# Patient Record
Sex: Female | Born: 1935 | Race: White | Hispanic: No | Smoking: Never smoker
Health system: Southern US, Community
[De-identification: ages and names within clinical notes are randomized; demographics above are authoritative.]

## PROBLEM LIST (undated history)

## (undated) DIAGNOSIS — D126 Benign neoplasm of colon, unspecified: Secondary | ICD-10-CM

## (undated) DIAGNOSIS — M1990 Unspecified osteoarthritis, unspecified site: Secondary | ICD-10-CM

## (undated) DIAGNOSIS — K5790 Diverticulosis of intestine, part unspecified, without perforation or abscess without bleeding: Secondary | ICD-10-CM

## (undated) DIAGNOSIS — I10 Essential (primary) hypertension: Secondary | ICD-10-CM

## (undated) DIAGNOSIS — S82409A Unspecified fracture of shaft of unspecified fibula, initial encounter for closed fracture: Secondary | ICD-10-CM

## (undated) DIAGNOSIS — IMO0002 Reserved for concepts with insufficient information to code with codable children: Secondary | ICD-10-CM

## (undated) DIAGNOSIS — D131 Benign neoplasm of stomach: Secondary | ICD-10-CM

## (undated) DIAGNOSIS — K219 Gastro-esophageal reflux disease without esophagitis: Secondary | ICD-10-CM

## (undated) DIAGNOSIS — I4510 Unspecified right bundle-branch block: Secondary | ICD-10-CM

## (undated) DIAGNOSIS — M5430 Sciatica, unspecified side: Secondary | ICD-10-CM

## (undated) DIAGNOSIS — K224 Dyskinesia of esophagus: Secondary | ICD-10-CM

## (undated) DIAGNOSIS — R202 Paresthesia of skin: Secondary | ICD-10-CM

## (undated) DIAGNOSIS — K449 Diaphragmatic hernia without obstruction or gangrene: Secondary | ICD-10-CM

## (undated) DIAGNOSIS — M4800 Spinal stenosis, site unspecified: Secondary | ICD-10-CM

## (undated) DIAGNOSIS — J45909 Unspecified asthma, uncomplicated: Secondary | ICD-10-CM

## (undated) DIAGNOSIS — M329 Systemic lupus erythematosus, unspecified: Secondary | ICD-10-CM

## (undated) DIAGNOSIS — R400 Somnolence: Secondary | ICD-10-CM

## (undated) DIAGNOSIS — J683 Other acute and subacute respiratory conditions due to chemicals, gases, fumes and vapors: Secondary | ICD-10-CM

## (undated) DIAGNOSIS — K259 Gastric ulcer, unspecified as acute or chronic, without hemorrhage or perforation: Secondary | ICD-10-CM

## (undated) DIAGNOSIS — I2510 Atherosclerotic heart disease of native coronary artery without angina pectoris: Secondary | ICD-10-CM

## (undated) DIAGNOSIS — M810 Age-related osteoporosis without current pathological fracture: Secondary | ICD-10-CM

## (undated) DIAGNOSIS — K8020 Calculus of gallbladder without cholecystitis without obstruction: Secondary | ICD-10-CM

## (undated) DIAGNOSIS — D649 Anemia, unspecified: Secondary | ICD-10-CM

## (undated) DIAGNOSIS — K648 Other hemorrhoids: Secondary | ICD-10-CM

## (undated) DIAGNOSIS — M2560 Stiffness of unspecified joint, not elsewhere classified: Secondary | ICD-10-CM

## (undated) DIAGNOSIS — J302 Other seasonal allergic rhinitis: Secondary | ICD-10-CM

## (undated) DIAGNOSIS — G629 Polyneuropathy, unspecified: Secondary | ICD-10-CM

## (undated) DIAGNOSIS — E7800 Pure hypercholesterolemia, unspecified: Secondary | ICD-10-CM

## (undated) HISTORY — DX: Essential (primary) hypertension: I10

## (undated) HISTORY — PX: COLONOSCOPY W/ BIOPSIES: SHX1374

## (undated) HISTORY — DX: Stiffness of unspecified joint, not elsewhere classified: M25.60

## (undated) HISTORY — DX: Reserved for concepts with insufficient information to code with codable children: IMO0002

## (undated) HISTORY — DX: Unspecified asthma, uncomplicated: J45.909

## (undated) HISTORY — DX: Somnolence: R40.0

## (undated) HISTORY — DX: Unspecified right bundle-branch block: I45.10

## (undated) HISTORY — DX: Other hemorrhoids: K64.8

## (undated) HISTORY — DX: Gastro-esophageal reflux disease without esophagitis: K21.9

## (undated) HISTORY — DX: Other seasonal allergic rhinitis: J30.2

## (undated) HISTORY — DX: Age-related osteoporosis without current pathological fracture: M81.0

## (undated) HISTORY — DX: Benign neoplasm of colon, unspecified: D12.6

## (undated) HISTORY — DX: Pure hypercholesterolemia, unspecified: E78.00

## (undated) HISTORY — DX: Atherosclerotic heart disease of native coronary artery without angina pectoris: I25.10

## (undated) HISTORY — DX: Spinal stenosis, site unspecified: M48.00

## (undated) HISTORY — DX: Diaphragmatic hernia without obstruction or gangrene: K44.9

## (undated) HISTORY — DX: Unspecified osteoarthritis, unspecified site: M19.90

## (undated) HISTORY — DX: Systemic lupus erythematosus, unspecified: M32.9

## (undated) HISTORY — DX: Diverticulosis of intestine, part unspecified, without perforation or abscess without bleeding: K57.90

## (undated) HISTORY — DX: Paresthesia of skin: R20.2

## (undated) HISTORY — DX: Gastric ulcer, unspecified as acute or chronic, without hemorrhage or perforation: K25.9

## (undated) HISTORY — DX: Unspecified fracture of shaft of unspecified fibula, initial encounter for closed fracture: S82.409A

## (undated) HISTORY — DX: Other acute and subacute respiratory conditions due to chemicals, gases, fumes and vapors: J68.3

## (undated) HISTORY — DX: Calculus of gallbladder without cholecystitis without obstruction: K80.20

## (undated) HISTORY — DX: Dyskinesia of esophagus: K22.4

## (undated) HISTORY — DX: Anemia, unspecified: D64.9

## (undated) HISTORY — DX: Benign neoplasm of stomach: D13.1

## (undated) HISTORY — DX: Polyneuropathy, unspecified: G62.9

## (undated) HISTORY — PX: CATARACT EXTRACTION, BILATERAL: SHX1313

## (undated) HISTORY — PX: BREAST EXCISIONAL BIOPSY: SUR124

## (undated) HISTORY — DX: Sciatica, unspecified side: M54.30

---

## 1973-01-28 HISTORY — PX: ABDOMINAL HYSTERECTOMY: SHX81

## 2000-09-11 ENCOUNTER — Encounter: Payer: Self-pay | Admitting: Internal Medicine

## 2000-09-11 ENCOUNTER — Encounter: Admission: RE | Admit: 2000-09-11 | Discharge: 2000-09-11 | Payer: Self-pay | Admitting: Internal Medicine

## 2000-12-02 ENCOUNTER — Encounter: Admission: RE | Admit: 2000-12-02 | Discharge: 2001-03-02 | Payer: Self-pay | Admitting: *Deleted

## 2002-05-11 ENCOUNTER — Encounter: Payer: Self-pay | Admitting: Orthopedic Surgery

## 2002-05-11 ENCOUNTER — Encounter: Admission: RE | Admit: 2002-05-11 | Discharge: 2002-05-11 | Payer: Self-pay | Admitting: Orthopedic Surgery

## 2002-09-21 ENCOUNTER — Other Ambulatory Visit: Admission: RE | Admit: 2002-09-21 | Discharge: 2002-09-21 | Payer: Self-pay | Admitting: Obstetrics and Gynecology

## 2003-01-29 DIAGNOSIS — I2510 Atherosclerotic heart disease of native coronary artery without angina pectoris: Secondary | ICD-10-CM

## 2003-01-29 HISTORY — DX: Atherosclerotic heart disease of native coronary artery without angina pectoris: I25.10

## 2003-01-29 HISTORY — PX: CORONARY ANGIOPLASTY: SHX604

## 2003-03-10 ENCOUNTER — Observation Stay (HOSPITAL_COMMUNITY): Admission: AD | Admit: 2003-03-10 | Discharge: 2003-03-11 | Payer: Self-pay | Admitting: Cardiology

## 2003-03-28 ENCOUNTER — Encounter (HOSPITAL_COMMUNITY): Admission: RE | Admit: 2003-03-28 | Discharge: 2003-06-26 | Payer: Self-pay | Admitting: Cardiology

## 2003-06-29 ENCOUNTER — Encounter (HOSPITAL_COMMUNITY): Admission: RE | Admit: 2003-06-29 | Discharge: 2003-09-27 | Payer: Self-pay | Admitting: Cardiology

## 2003-09-29 ENCOUNTER — Encounter (HOSPITAL_COMMUNITY): Admission: RE | Admit: 2003-09-29 | Discharge: 2003-12-28 | Payer: Self-pay | Admitting: Cardiology

## 2003-10-20 ENCOUNTER — Other Ambulatory Visit: Admission: RE | Admit: 2003-10-20 | Discharge: 2003-10-20 | Payer: Self-pay | Admitting: Obstetrics and Gynecology

## 2003-12-29 ENCOUNTER — Encounter (HOSPITAL_COMMUNITY): Admission: RE | Admit: 2003-12-29 | Discharge: 2004-03-28 | Payer: Self-pay | Admitting: Cardiology

## 2004-03-29 ENCOUNTER — Encounter (HOSPITAL_COMMUNITY): Admission: RE | Admit: 2004-03-29 | Discharge: 2004-06-27 | Payer: Self-pay | Admitting: Cardiology

## 2004-06-28 ENCOUNTER — Encounter (HOSPITAL_COMMUNITY): Admission: RE | Admit: 2004-06-28 | Discharge: 2004-09-26 | Payer: Self-pay | Admitting: Cardiology

## 2004-09-28 ENCOUNTER — Encounter (HOSPITAL_COMMUNITY): Admission: RE | Admit: 2004-09-28 | Discharge: 2004-12-27 | Payer: Self-pay | Admitting: Cardiology

## 2004-10-22 ENCOUNTER — Other Ambulatory Visit: Admission: RE | Admit: 2004-10-22 | Discharge: 2004-10-22 | Payer: Self-pay | Admitting: Obstetrics and Gynecology

## 2004-11-02 ENCOUNTER — Encounter: Admission: RE | Admit: 2004-11-02 | Discharge: 2004-11-02 | Payer: Self-pay | Admitting: Obstetrics and Gynecology

## 2004-12-28 ENCOUNTER — Encounter (HOSPITAL_COMMUNITY): Admission: RE | Admit: 2004-12-28 | Discharge: 2005-03-27 | Payer: Self-pay | Admitting: Cardiology

## 2005-03-28 ENCOUNTER — Encounter (HOSPITAL_COMMUNITY): Admission: RE | Admit: 2005-03-28 | Discharge: 2005-06-26 | Payer: Self-pay | Admitting: Cardiology

## 2005-06-28 ENCOUNTER — Encounter (HOSPITAL_COMMUNITY): Admission: RE | Admit: 2005-06-28 | Discharge: 2005-09-26 | Payer: Self-pay | Admitting: Cardiology

## 2005-08-12 ENCOUNTER — Encounter: Admission: RE | Admit: 2005-08-12 | Discharge: 2005-08-12 | Payer: Self-pay | Admitting: Orthopedic Surgery

## 2005-09-03 ENCOUNTER — Encounter: Admission: RE | Admit: 2005-09-03 | Discharge: 2005-09-03 | Payer: Self-pay | Admitting: Orthopedic Surgery

## 2005-09-28 ENCOUNTER — Encounter: Admission: RE | Admit: 2005-09-28 | Discharge: 2005-12-27 | Payer: Self-pay | Admitting: Cardiology

## 2005-10-23 ENCOUNTER — Encounter: Admission: RE | Admit: 2005-10-23 | Discharge: 2005-10-23 | Payer: Self-pay | Admitting: Obstetrics and Gynecology

## 2005-12-28 ENCOUNTER — Encounter (HOSPITAL_COMMUNITY): Admission: RE | Admit: 2005-12-28 | Discharge: 2006-03-28 | Payer: Self-pay | Admitting: Cardiology

## 2006-03-29 ENCOUNTER — Encounter (HOSPITAL_COMMUNITY): Admission: RE | Admit: 2006-03-29 | Discharge: 2006-06-27 | Payer: Self-pay | Admitting: Cardiology

## 2006-06-30 ENCOUNTER — Encounter (HOSPITAL_COMMUNITY): Admission: RE | Admit: 2006-06-30 | Discharge: 2006-09-28 | Payer: Self-pay | Admitting: Cardiology

## 2006-09-29 ENCOUNTER — Encounter (HOSPITAL_COMMUNITY): Admission: RE | Admit: 2006-09-29 | Discharge: 2006-12-28 | Payer: Self-pay | Admitting: Cardiology

## 2006-10-27 ENCOUNTER — Encounter: Admission: RE | Admit: 2006-10-27 | Discharge: 2006-10-27 | Payer: Self-pay | Admitting: Obstetrics and Gynecology

## 2006-12-29 ENCOUNTER — Encounter (HOSPITAL_COMMUNITY): Admission: RE | Admit: 2006-12-29 | Discharge: 2007-01-27 | Payer: Self-pay | Admitting: Cardiology

## 2007-01-29 ENCOUNTER — Encounter (HOSPITAL_COMMUNITY): Admission: RE | Admit: 2007-01-29 | Discharge: 2007-02-27 | Payer: Self-pay | Admitting: Cardiology

## 2007-01-30 ENCOUNTER — Encounter: Admission: RE | Admit: 2007-01-30 | Discharge: 2007-01-30 | Payer: Self-pay | Admitting: Obstetrics and Gynecology

## 2007-03-01 ENCOUNTER — Encounter (HOSPITAL_COMMUNITY): Admission: RE | Admit: 2007-03-01 | Discharge: 2007-05-30 | Payer: Self-pay | Admitting: Cardiology

## 2007-03-05 ENCOUNTER — Encounter: Admission: RE | Admit: 2007-03-05 | Discharge: 2007-03-05 | Payer: Self-pay | Admitting: Obstetrics and Gynecology

## 2007-05-24 ENCOUNTER — Emergency Department (HOSPITAL_COMMUNITY): Admission: EM | Admit: 2007-05-24 | Discharge: 2007-05-24 | Payer: Self-pay | Admitting: Emergency Medicine

## 2007-05-31 ENCOUNTER — Encounter (HOSPITAL_COMMUNITY): Admission: RE | Admit: 2007-05-31 | Discharge: 2007-08-29 | Payer: Self-pay | Admitting: Cardiology

## 2007-08-30 ENCOUNTER — Encounter (HOSPITAL_COMMUNITY): Admission: RE | Admit: 2007-08-30 | Discharge: 2007-11-28 | Payer: Self-pay | Admitting: Cardiology

## 2007-10-28 ENCOUNTER — Encounter: Admission: RE | Admit: 2007-10-28 | Discharge: 2007-10-28 | Payer: Self-pay | Admitting: Obstetrics and Gynecology

## 2007-11-10 ENCOUNTER — Encounter: Admission: RE | Admit: 2007-11-10 | Discharge: 2007-11-10 | Payer: Self-pay | Admitting: Obstetrics and Gynecology

## 2007-11-19 ENCOUNTER — Ambulatory Visit (HOSPITAL_COMMUNITY): Admission: RE | Admit: 2007-11-19 | Discharge: 2007-11-19 | Payer: Self-pay | Admitting: Endocrinology

## 2007-11-30 ENCOUNTER — Encounter (HOSPITAL_COMMUNITY): Admission: RE | Admit: 2007-11-30 | Discharge: 2008-02-28 | Payer: Self-pay | Admitting: Cardiology

## 2008-02-29 ENCOUNTER — Encounter (HOSPITAL_COMMUNITY): Admission: RE | Admit: 2008-02-29 | Discharge: 2008-05-29 | Payer: Self-pay | Admitting: Cardiology

## 2008-05-30 ENCOUNTER — Encounter (HOSPITAL_COMMUNITY): Admission: RE | Admit: 2008-05-30 | Discharge: 2008-08-28 | Payer: Self-pay | Admitting: Cardiology

## 2008-08-29 ENCOUNTER — Encounter (HOSPITAL_COMMUNITY): Admission: RE | Admit: 2008-08-29 | Discharge: 2008-11-27 | Payer: Self-pay | Admitting: Cardiology

## 2008-10-28 ENCOUNTER — Encounter (HOSPITAL_COMMUNITY): Admission: RE | Admit: 2008-10-28 | Discharge: 2009-01-26 | Payer: Self-pay | Admitting: Cardiology

## 2008-10-28 ENCOUNTER — Encounter: Admission: RE | Admit: 2008-10-28 | Discharge: 2008-10-28 | Payer: Self-pay | Admitting: Obstetrics and Gynecology

## 2009-01-28 ENCOUNTER — Encounter (HOSPITAL_COMMUNITY): Admission: RE | Admit: 2009-01-28 | Discharge: 2009-04-28 | Payer: Self-pay | Admitting: Cardiology

## 2009-01-28 HISTORY — PX: FIBULA FRACTURE SURGERY: SHX947

## 2009-04-29 ENCOUNTER — Encounter (HOSPITAL_COMMUNITY): Admission: RE | Admit: 2009-04-29 | Discharge: 2009-07-28 | Payer: Self-pay | Admitting: Cardiology

## 2009-07-22 ENCOUNTER — Emergency Department (HOSPITAL_COMMUNITY)
Admission: EM | Admit: 2009-07-22 | Discharge: 2009-07-22 | Payer: Self-pay | Source: Home / Self Care | Admitting: Emergency Medicine

## 2009-08-03 ENCOUNTER — Inpatient Hospital Stay (HOSPITAL_COMMUNITY): Admission: RE | Admit: 2009-08-03 | Discharge: 2009-08-07 | Payer: Self-pay | Admitting: Orthopedic Surgery

## 2009-08-10 LAB — LIPID PANEL
Cholesterol: 156 mg/dL (ref 0–200)
HDL: 56 mg/dL (ref 35–70)

## 2009-10-09 ENCOUNTER — Ambulatory Visit: Payer: Self-pay | Admitting: Cardiology

## 2009-10-31 ENCOUNTER — Encounter (HOSPITAL_COMMUNITY)
Admission: RE | Admit: 2009-10-31 | Discharge: 2010-01-29 | Payer: Self-pay | Source: Home / Self Care | Attending: Cardiology | Admitting: Cardiology

## 2009-11-10 ENCOUNTER — Encounter: Admission: RE | Admit: 2009-11-10 | Discharge: 2009-11-10 | Payer: Self-pay | Admitting: Obstetrics and Gynecology

## 2010-01-26 ENCOUNTER — Encounter
Admission: RE | Admit: 2010-01-26 | Discharge: 2010-01-26 | Payer: Self-pay | Source: Home / Self Care | Attending: Gastroenterology | Admitting: Gastroenterology

## 2010-01-30 ENCOUNTER — Encounter (HOSPITAL_COMMUNITY)
Admission: RE | Admit: 2010-01-30 | Discharge: 2010-02-27 | Payer: Self-pay | Source: Home / Self Care | Attending: Cardiology | Admitting: Cardiology

## 2010-02-10 ENCOUNTER — Encounter
Admission: RE | Admit: 2010-02-10 | Discharge: 2010-02-10 | Payer: Self-pay | Source: Home / Self Care | Attending: Neurosurgery | Admitting: Neurosurgery

## 2010-02-17 ENCOUNTER — Encounter: Payer: Self-pay | Admitting: Obstetrics and Gynecology

## 2010-03-01 ENCOUNTER — Encounter (HOSPITAL_COMMUNITY): Payer: Medicare Other | Attending: Cardiology

## 2010-03-01 DIAGNOSIS — Z9861 Coronary angioplasty status: Secondary | ICD-10-CM | POA: Insufficient documentation

## 2010-03-01 DIAGNOSIS — M329 Systemic lupus erythematosus, unspecified: Secondary | ICD-10-CM | POA: Insufficient documentation

## 2010-03-01 DIAGNOSIS — Z8249 Family history of ischemic heart disease and other diseases of the circulatory system: Secondary | ICD-10-CM | POA: Insufficient documentation

## 2010-03-01 DIAGNOSIS — I2510 Atherosclerotic heart disease of native coronary artery without angina pectoris: Secondary | ICD-10-CM | POA: Insufficient documentation

## 2010-03-01 DIAGNOSIS — Z5189 Encounter for other specified aftercare: Secondary | ICD-10-CM | POA: Insufficient documentation

## 2010-03-01 DIAGNOSIS — E7800 Pure hypercholesterolemia, unspecified: Secondary | ICD-10-CM | POA: Insufficient documentation

## 2010-03-06 ENCOUNTER — Encounter: Payer: Self-pay | Admitting: Cardiology

## 2010-03-06 ENCOUNTER — Encounter (HOSPITAL_COMMUNITY): Payer: Medicare Other

## 2010-03-06 DIAGNOSIS — I2510 Atherosclerotic heart disease of native coronary artery without angina pectoris: Secondary | ICD-10-CM | POA: Insufficient documentation

## 2010-03-06 DIAGNOSIS — M329 Systemic lupus erythematosus, unspecified: Secondary | ICD-10-CM | POA: Insufficient documentation

## 2010-03-06 DIAGNOSIS — M4800 Spinal stenosis, site unspecified: Secondary | ICD-10-CM | POA: Insufficient documentation

## 2010-03-06 DIAGNOSIS — E7800 Pure hypercholesterolemia, unspecified: Secondary | ICD-10-CM | POA: Insufficient documentation

## 2010-03-06 DIAGNOSIS — K219 Gastro-esophageal reflux disease without esophagitis: Secondary | ICD-10-CM | POA: Insufficient documentation

## 2010-03-08 ENCOUNTER — Encounter (HOSPITAL_COMMUNITY): Payer: Medicare Other

## 2010-03-09 ENCOUNTER — Encounter (HOSPITAL_COMMUNITY): Payer: Medicare Other

## 2010-03-13 ENCOUNTER — Encounter (HOSPITAL_COMMUNITY): Payer: Medicare Other

## 2010-03-15 ENCOUNTER — Encounter (HOSPITAL_COMMUNITY): Payer: Medicare Other

## 2010-03-16 ENCOUNTER — Encounter (HOSPITAL_COMMUNITY): Payer: Medicare Other

## 2010-03-20 ENCOUNTER — Encounter (HOSPITAL_COMMUNITY): Payer: Medicare Other

## 2010-03-22 ENCOUNTER — Encounter (HOSPITAL_COMMUNITY): Payer: Medicare Other

## 2010-03-23 ENCOUNTER — Encounter (HOSPITAL_COMMUNITY): Payer: Medicare Other

## 2010-03-27 ENCOUNTER — Encounter (HOSPITAL_COMMUNITY): Payer: Medicare Other

## 2010-03-29 ENCOUNTER — Encounter (HOSPITAL_COMMUNITY): Payer: Self-pay | Attending: Cardiology

## 2010-03-29 DIAGNOSIS — Z5189 Encounter for other specified aftercare: Secondary | ICD-10-CM | POA: Insufficient documentation

## 2010-03-29 DIAGNOSIS — E7800 Pure hypercholesterolemia, unspecified: Secondary | ICD-10-CM | POA: Insufficient documentation

## 2010-03-29 DIAGNOSIS — I2510 Atherosclerotic heart disease of native coronary artery without angina pectoris: Secondary | ICD-10-CM | POA: Insufficient documentation

## 2010-03-29 DIAGNOSIS — Z8249 Family history of ischemic heart disease and other diseases of the circulatory system: Secondary | ICD-10-CM | POA: Insufficient documentation

## 2010-03-29 DIAGNOSIS — Z9861 Coronary angioplasty status: Secondary | ICD-10-CM | POA: Insufficient documentation

## 2010-03-29 DIAGNOSIS — M329 Systemic lupus erythematosus, unspecified: Secondary | ICD-10-CM | POA: Insufficient documentation

## 2010-03-30 ENCOUNTER — Encounter (HOSPITAL_COMMUNITY): Payer: Self-pay

## 2010-04-03 ENCOUNTER — Encounter (HOSPITAL_COMMUNITY): Payer: Self-pay

## 2010-04-05 ENCOUNTER — Encounter (HOSPITAL_COMMUNITY): Payer: Self-pay

## 2010-04-06 ENCOUNTER — Encounter (HOSPITAL_COMMUNITY): Payer: Self-pay

## 2010-04-10 ENCOUNTER — Encounter (HOSPITAL_COMMUNITY): Payer: Self-pay

## 2010-04-12 ENCOUNTER — Encounter (HOSPITAL_COMMUNITY): Payer: Self-pay

## 2010-04-13 ENCOUNTER — Encounter (HOSPITAL_COMMUNITY): Payer: Self-pay

## 2010-04-15 LAB — CBC
MCH: 32.9 pg (ref 26.0–34.0)
MCHC: 33.8 g/dL (ref 30.0–36.0)
MCV: 97.3 fL (ref 78.0–100.0)
Platelets: 176 10*3/uL (ref 150–400)
RBC: 4.21 MIL/uL (ref 3.87–5.11)

## 2010-04-15 LAB — PROTIME-INR: INR: 1.07 (ref 0.00–1.49)

## 2010-04-15 LAB — COMPREHENSIVE METABOLIC PANEL
AST: 26 U/L (ref 0–37)
Albumin: 3.6 g/dL (ref 3.5–5.2)
Alkaline Phosphatase: 59 U/L (ref 39–117)
Chloride: 108 mEq/L (ref 96–112)
GFR calc non Af Amer: >60 mL/min (ref >60)
Glucose, Bld: 92 mg/dL (ref 70–99)
Potassium: 4.8 mEq/L (ref 3.5–5.1)
Sodium: 142 mEq/L (ref 135–145)
Total Bilirubin: 1 mg/dL (ref 0.3–1.2)

## 2010-04-17 ENCOUNTER — Encounter (HOSPITAL_COMMUNITY): Payer: Self-pay

## 2010-04-19 ENCOUNTER — Encounter (HOSPITAL_COMMUNITY): Payer: Self-pay

## 2010-04-20 ENCOUNTER — Encounter (HOSPITAL_COMMUNITY): Payer: Self-pay

## 2010-04-23 ENCOUNTER — Ambulatory Visit: Payer: Self-pay | Admitting: Cardiology

## 2010-04-24 ENCOUNTER — Encounter (HOSPITAL_COMMUNITY): Payer: Self-pay

## 2010-04-26 ENCOUNTER — Encounter (HOSPITAL_COMMUNITY): Payer: Self-pay

## 2010-04-27 ENCOUNTER — Encounter (HOSPITAL_COMMUNITY): Payer: Self-pay

## 2010-05-01 ENCOUNTER — Ambulatory Visit (INDEPENDENT_AMBULATORY_CARE_PROVIDER_SITE_OTHER): Payer: Medicare Other | Admitting: Cardiology

## 2010-05-01 ENCOUNTER — Encounter: Payer: Self-pay | Admitting: Cardiology

## 2010-05-01 ENCOUNTER — Encounter (HOSPITAL_COMMUNITY): Payer: Self-pay | Attending: Cardiology

## 2010-05-01 DIAGNOSIS — E7800 Pure hypercholesterolemia, unspecified: Secondary | ICD-10-CM

## 2010-05-01 DIAGNOSIS — I2510 Atherosclerotic heart disease of native coronary artery without angina pectoris: Secondary | ICD-10-CM | POA: Insufficient documentation

## 2010-05-01 DIAGNOSIS — IMO0001 Reserved for inherently not codable concepts without codable children: Secondary | ICD-10-CM

## 2010-05-01 DIAGNOSIS — M329 Systemic lupus erythematosus, unspecified: Secondary | ICD-10-CM | POA: Insufficient documentation

## 2010-05-01 DIAGNOSIS — Z9861 Coronary angioplasty status: Secondary | ICD-10-CM | POA: Insufficient documentation

## 2010-05-01 DIAGNOSIS — R030 Elevated blood-pressure reading, without diagnosis of hypertension: Secondary | ICD-10-CM

## 2010-05-01 DIAGNOSIS — R079 Chest pain, unspecified: Secondary | ICD-10-CM

## 2010-05-01 DIAGNOSIS — Z8249 Family history of ischemic heart disease and other diseases of the circulatory system: Secondary | ICD-10-CM | POA: Insufficient documentation

## 2010-05-01 DIAGNOSIS — Z5189 Encounter for other specified aftercare: Secondary | ICD-10-CM | POA: Insufficient documentation

## 2010-05-01 MED ORDER — NITROGLYCERIN 0.4 MG SL SUBL: 0.4000 mg | SUBLINGUAL_TABLET | SUBLINGUAL | Status: DC | PRN

## 2010-05-01 NOTE — Assessment & Plan Note (Signed)
We will continue with Lipitor. We'll have her return for fasting lab work including a Automotive engineer, lipid panel, and CBC.

## 2010-05-01 NOTE — Patient Instructions (Signed)
We will schedule you for a nuclear stress test to evaluate your chest pain.  We will schedule you for fasting lab work.  Continue current medications.  We will tentatively see you for follow up in 1 month.

## 2010-05-01 NOTE — Assessment & Plan Note (Signed)
She has had recent symptoms of atypical chest pain. She has minor ECG changes. I have recommended a stress Cardiolite study to rule out significant ischemia. We will refill her nitroglycerin prescription. If her stress test is abnormal we may need to consider cardiac catheterization.

## 2010-05-01 NOTE — Assessment & Plan Note (Signed)
Blood pressure is mildly elevated today but readings at  rehabilitation have been excellent. Will continue on her current therapy and monitor.

## 2010-05-01 NOTE — Progress Notes (Signed)
HPI Pamela Gonzales is seen for followup today. She reports that she's been under a lot of stress recently. She recently sold her home and has moved into the retirement community at [facility] [employer] [facility]. Over the past 3 days she reports an unusual discomfort across her chest. She has had some indigestion and some nausea. Her symptoms are not exertional. She continues to participate actively in cardiac rehabilitation. Her blood pressure readings at rehabilitation have been excellent. She denies any shortness of breath, palpitations, or dizziness. Allergies  Allergen Reactions  . Keflex   . Penicillins   . Sulfa Drugs Cross Reactors     Current Outpatient Prescriptions on File Prior to Visit  Medication Sig Dispense Refill  . aspirin 81 MG tablet Take 81 mg by mouth daily.        Marland Kitchen atorvastatin (LIPITOR) 40 MG tablet Take 40 mg by mouth daily.        . calcitonin, salmon, (MIACALCIN) 200 UNIT/ACT nasal spray 1 spray by Nasal route daily.        . calcium carbonate 200 MG capsule Take 600 mg by mouth 2 (two) times daily.       . Cholecalciferol (VITAMIN D) 1000 UNITS capsule Take 1,000 Units by mouth daily.        . clopidogrel (PLAVIX) 75 MG tablet Take 75 mg by mouth daily.       . folic acid (FOLVITE) 1 MG tablet Take 1 mg by mouth daily.        . hydroxychloroquine (PLAQUENIL) 200 MG tablet Take by mouth daily.        . Multiple Vitamins-Minerals (MULTIVITAMIN) tablet Take 1 tablet by mouth daily.  30 tablet    . DISCONTD: nitroGLYCERIN (NITROSTAT) 0.4 MG SL tablet Place 0.4 mg under the tongue every 5 (five) minutes as needed.          Past Medical History  Diagnosis Date  . CAD (coronary artery disease)   . Hypercholesteremia   . Lupus   . GERD (gastroesophageal reflux disease)   . Spinal stenosis   . Fibula fracture     right    Past Surgical History  Procedure Date  . Abdominal hysterectomy   . Breast lumpectomy   . Coronary angioplasty     Family History  Problem Relation Age of Onset  .  Cancer Father   . Heart attack Mother   . Heart attack Brother   . Cancer Sister   . Cancer Sister     History   Social History  . Marital Status: Divorced    Spouse Name: N/A    Number of Children: 2  . Years of Education: N/A   Occupational History  . insurance sales     retired   Social History Main Topics  . Smoking status: Never Smoker   . Smokeless tobacco: Not on file  . Alcohol Use: No  . Drug Use: No  . Sexually Active: Not on file   Other Topics Concern  . Not on file   Social History Narrative  . No narrative on file    ROS The patient denies any heat or cold intolerance.  No weight gain or weight loss.  The patient denies headaches or blurry vision.  There is no cough or sputum production.  The patient denies dizziness.  There is no hematuria or hematochezia.  The patient denies any muscle aches or arthritis.  The patient denies any rash.  The patient denies frequent falling or instability.  There  is no history of depression or anxiety.  All other systems were reviewed and are negative.  PHYSICAL EXAM BP 138/72  Pulse 64  Ht 5\' 4"  (1.626 m)  Wt 174 lb 6.4 oz (79.107 kg)  BMI 29.94 kg/m2 The patient is alert and oriented x 3.  The mood and affect are normal.  The skin is warm and dry.  Color is normal.  The HEENT exam reveals that the sclera are nonicteric.  The mucous membranes are moist.  The carotids are 2+ without bruits.  There is no thyromegaly.  There is no JVD.  The lungs are clear.  The chest wall is non tender.  The heart exam reveals a regular rate with a normal S1 and S2.  There are no murmurs, gallops, or rubs.  The PMI is not displaced.   Abdominal exam reveals good bowel sounds.  There is no guarding or rebound.  There is no hepatosplenomegaly or tenderness.  There are no masses.  Exam of the legs reveal no clubbing, cyanosis, or edema.  The legs are without rashes.  The distal pulses are intact.  Cranial nerves II - XII are intact.  Motor and  sensory functions are intact.  The gait is normal.  Laboratory data: ECG demonstrates normal sinus rhythm she has mild ST-T wave depression in the inferior leads. This has increased slightly compared to 2011.  ASSESSMENT AND PLAN

## 2010-05-03 ENCOUNTER — Encounter (HOSPITAL_COMMUNITY): Payer: Self-pay

## 2010-05-04 ENCOUNTER — Encounter (HOSPITAL_COMMUNITY): Payer: Self-pay

## 2010-05-08 ENCOUNTER — Encounter (HOSPITAL_COMMUNITY): Payer: Self-pay

## 2010-05-09 ENCOUNTER — Other Ambulatory Visit: Payer: Self-pay | Admitting: *Deleted

## 2010-05-09 DIAGNOSIS — Z79899 Other long term (current) drug therapy: Secondary | ICD-10-CM

## 2010-05-09 DIAGNOSIS — E7800 Pure hypercholesterolemia, unspecified: Secondary | ICD-10-CM

## 2010-05-10 ENCOUNTER — Other Ambulatory Visit: Payer: Self-pay | Admitting: *Deleted

## 2010-05-10 ENCOUNTER — Encounter (HOSPITAL_COMMUNITY): Payer: Self-pay

## 2010-05-10 ENCOUNTER — Ambulatory Visit (HOSPITAL_COMMUNITY): Payer: Medicare Other | Attending: Cardiology | Admitting: Radiology

## 2010-05-10 ENCOUNTER — Other Ambulatory Visit: Payer: Medicare Other | Admitting: *Deleted

## 2010-05-10 ENCOUNTER — Other Ambulatory Visit (INDEPENDENT_AMBULATORY_CARE_PROVIDER_SITE_OTHER): Payer: Medicare Other | Admitting: *Deleted

## 2010-05-10 DIAGNOSIS — E559 Vitamin D deficiency, unspecified: Secondary | ICD-10-CM

## 2010-05-10 DIAGNOSIS — R7301 Impaired fasting glucose: Secondary | ICD-10-CM

## 2010-05-10 DIAGNOSIS — E7800 Pure hypercholesterolemia, unspecified: Secondary | ICD-10-CM

## 2010-05-10 DIAGNOSIS — D689 Coagulation defect, unspecified: Secondary | ICD-10-CM

## 2010-05-10 DIAGNOSIS — Z79899 Other long term (current) drug therapy: Secondary | ICD-10-CM

## 2010-05-10 DIAGNOSIS — I2510 Atherosclerotic heart disease of native coronary artery without angina pectoris: Secondary | ICD-10-CM

## 2010-05-10 DIAGNOSIS — R079 Chest pain, unspecified: Secondary | ICD-10-CM

## 2010-05-10 DIAGNOSIS — R0789 Other chest pain: Secondary | ICD-10-CM

## 2010-05-10 LAB — BASIC METABOLIC PANEL
CO2: 29 mEq/L (ref 19–32)
Calcium: 9 mg/dL (ref 8.4–10.5)
Glucose, Bld: 75 mg/dL (ref 70–99)
Potassium: 4.1 mEq/L (ref 3.5–5.1)
Sodium: 140 mEq/L (ref 135–145)

## 2010-05-10 LAB — CBC WITH DIFFERENTIAL/PLATELET
Basophils Relative: 0.6 % (ref 0.0–3.0)
Eosinophils Relative: 0 % (ref 0.0–5.0)
HCT: 41.6 % (ref 36.0–46.0)
Hemoglobin: 14.2 g/dL (ref 12.0–15.0)
Lymphs Abs: 1.1 10*3/uL (ref 0.7–4.0)
MCV: 99.1 fl (ref 78.0–100.0)
Monocytes Absolute: 0.5 10*3/uL (ref 0.1–1.0)
RBC: 4.2 Mil/uL (ref 3.87–5.11)
WBC: 5.2 10*3/uL (ref 4.5–10.5)

## 2010-05-10 LAB — HEPATIC FUNCTION PANEL
ALT: 19 U/L (ref 0–35)
AST: 27 U/L (ref 0–37)
Albumin: 3.4 g/dL — ABNORMAL LOW (ref 3.5–5.2)
Alkaline Phosphatase: 52 U/L (ref 39–117)
Total Protein: 6.2 g/dL (ref 6.0–8.3)

## 2010-05-10 MED ORDER — TECHNETIUM TC 99M TETROFOSMIN IV KIT
10.5000 | PACK | Freq: Once | INTRAVENOUS | Status: AC | PRN
  Administered 2010-05-10: 11 via INTRAVENOUS

## 2010-05-10 MED ORDER — TECHNETIUM TC 99M TETROFOSMIN IV KIT
33.0000 | PACK | Freq: Once | INTRAVENOUS | Status: AC | PRN
  Administered 2010-05-10: 33 via INTRAVENOUS

## 2010-05-10 MED ORDER — REGADENOSON 0.4 MG/5ML IV SOLN
0.4000 mg | Freq: Once | INTRAVENOUS | Status: AC
  Administered 2010-05-10: 0.4 mg via INTRAVENOUS

## 2010-05-10 NOTE — Progress Notes (Signed)
[facility] SITE 3 NUCLEAR MED [address] [place] [address] [phone]  Cardiology Nuclear Med Study  Pamela Gonzales is a 77 y.o. female [id number] [birth year]   Nuclear Med Background Indication for Stress Test:  Evaluation for Ischemia, Stent Patency and Abnormal EKG History: 2005 Heart Catheterization,2010  Myocardial Perfusion Study -Normal and 2005 Stents Cardiac Risk Factors: Hypertension and Lipids  Symptoms:  Chest Pain- last episode 1 week ago   Nuclear Pre-Procedure Caffeine/Decaff Intake:  None NPO After: 7:00pm   Lungs: clear IV 0.9% NS with Angio Cath:  20g  IV Site: R Antecubital  IV Started by:  Stanton Kidney, EMT-P  Chest Size (in):  38 Cup Size: DD  Height: 5\' 4"  (1.626 m)  Weight:  173 lb (78.472 kg)  BMI:  Body mass index is 29.70 kg/(m^2). Tech Comments:  NA    Nuclear Med Study 1 or 2 day study: 1 day  Stress Test Type:  Lexiscan  Reading MD: Marca Ancona, MD  Order Authorizing Provider:  Dr. Peter Swaziland  Resting Radionuclide: Technetium 12m Tetrofosmin  Resting Radionuclide Dose: 10.5 mCi   Stress Radionuclide:  Technetium 24m Tetrofosmin  Stress Radionuclide Dose: 33 mCi           Stress Protocol Rest HR: 58 Stress HR: 90  Rest BP: 143/71 Stress BP: 156/76  Exercise Time (min): n/a METS: n/a          Dose of Adenosine (mg):  n/a Dose of Lexiscan: 0.4 mg  Dose of Atropine (mg): n/a Dose of Dobutamine: n/a mcg/kg/min (at max HR)  Stress Test Technologist: Bonnita Levan, RN  Nuclear Technologist:  Doyne Keel, CNMT     Rest Procedure:  Myocardial perfusion imaging was performed at rest 45 minutes following the intravenous administration of Technetium 62m Tetrofosmin. Rest ECG: Sinus Bradycardia Stress Procedure:  The patient received IV Lexiscan 0.4 mg over 15-seconds.  Technetium 65m Tetrofosmin injected at 30-seconds.Patient complained of Chest pain 5/10 during injection, which resolved during recovery. No acute EKG  changes from baseline noted.  Quantitative spect images were obtained after a 45 minute delay. Stress ECG:  QPS Raw Data Images:  Normal; no motion artifact; normal heart/lung ratio. Stress Images:  Small apical perfusion defect Rest Images:  Small apical perfusion defect. Subtraction (SDS):  Small fixed apical perfusion defect.  Transient Ischemic Dilatation (Normal <1.22):  1.14 Lung/Heart Ratio (Normal <0.45):  0.28  Quantitative Gated Spect Images QGS EDV:  74 ml QGS ESV:  21 ml QGS cine images:  NL LV Function; NL Wall Motion QGS EF: 72%  Impression Exercise Capacity:  Lexiscan with no exercise. BP Response:  Normal blood pressure response. Clinical Symptoms:  Mild chest pain/dyspnea. ECG Impression:  No significant ST segment change suggestive of ischemia. Comparison with Prior Nuclear Study: No images to compare  Overall Impression:  Small fixed apical perfusion defect is likely apical thinning.  Normal EF and wall motion.  No evidence for ischemia or infarction.   Dalton [employer]

## 2010-05-11 ENCOUNTER — Encounter (HOSPITAL_COMMUNITY): Payer: Self-pay

## 2010-05-11 NOTE — Progress Notes (Signed)
ROUTED TO DR. JORDAN.Falecha Clark ° ° °

## 2010-05-14 ENCOUNTER — Encounter: Payer: Self-pay | Admitting: Cardiology

## 2010-05-15 ENCOUNTER — Encounter (HOSPITAL_COMMUNITY): Payer: Self-pay

## 2010-05-15 ENCOUNTER — Telehealth: Payer: Self-pay | Admitting: *Deleted

## 2010-05-15 ENCOUNTER — Encounter: Payer: Self-pay | Admitting: *Deleted

## 2010-05-15 NOTE — Telephone Encounter (Signed)
LM w/ lab results and stress test results.

## 2010-05-15 NOTE — Telephone Encounter (Signed)
Message copied by Murrell Redden on Tue May 15, 2010 10:34 AM ------      Message from: Swaziland, PETER      Created: Sat May 12, 2010  2:23 PM       Chemistries, CBC, A1c, Vit D all normal. Lipids look OK. Please report.

## 2010-05-17 ENCOUNTER — Encounter (HOSPITAL_COMMUNITY): Payer: Self-pay

## 2010-05-18 ENCOUNTER — Encounter (HOSPITAL_COMMUNITY): Payer: Self-pay

## 2010-05-22 ENCOUNTER — Encounter (HOSPITAL_COMMUNITY): Payer: Self-pay

## 2010-05-24 ENCOUNTER — Encounter (HOSPITAL_COMMUNITY): Payer: Self-pay

## 2010-05-25 ENCOUNTER — Encounter (HOSPITAL_COMMUNITY): Payer: Self-pay

## 2010-05-25 ENCOUNTER — Telehealth: Payer: Self-pay | Admitting: Cardiology

## 2010-05-25 NOTE — Telephone Encounter (Signed)
Wanted nuclear study sent to [facility]. Faxed

## 2010-05-25 NOTE — Telephone Encounter (Signed)
Latest Stress Test results need to be faxed to [facility]. To the attention: [place]

## 2010-05-29 ENCOUNTER — Encounter (HOSPITAL_COMMUNITY): Payer: Self-pay | Attending: Cardiology

## 2010-05-29 DIAGNOSIS — Z9861 Coronary angioplasty status: Secondary | ICD-10-CM | POA: Insufficient documentation

## 2010-05-29 DIAGNOSIS — M329 Systemic lupus erythematosus, unspecified: Secondary | ICD-10-CM | POA: Insufficient documentation

## 2010-05-29 DIAGNOSIS — E7800 Pure hypercholesterolemia, unspecified: Secondary | ICD-10-CM | POA: Insufficient documentation

## 2010-05-29 DIAGNOSIS — Z8249 Family history of ischemic heart disease and other diseases of the circulatory system: Secondary | ICD-10-CM | POA: Insufficient documentation

## 2010-05-29 DIAGNOSIS — Z5189 Encounter for other specified aftercare: Secondary | ICD-10-CM | POA: Insufficient documentation

## 2010-05-29 DIAGNOSIS — I2510 Atherosclerotic heart disease of native coronary artery without angina pectoris: Secondary | ICD-10-CM | POA: Insufficient documentation

## 2010-05-31 ENCOUNTER — Encounter (HOSPITAL_COMMUNITY): Payer: Self-pay

## 2010-06-01 ENCOUNTER — Encounter (HOSPITAL_COMMUNITY): Payer: Self-pay

## 2010-06-05 ENCOUNTER — Encounter (HOSPITAL_COMMUNITY): Payer: Self-pay

## 2010-06-07 ENCOUNTER — Encounter (HOSPITAL_COMMUNITY): Payer: Self-pay

## 2010-06-08 ENCOUNTER — Encounter (HOSPITAL_COMMUNITY): Payer: Self-pay

## 2010-06-12 ENCOUNTER — Encounter (HOSPITAL_COMMUNITY): Payer: Self-pay

## 2010-06-14 ENCOUNTER — Encounter (HOSPITAL_COMMUNITY): Payer: Self-pay

## 2010-06-15 ENCOUNTER — Encounter (HOSPITAL_COMMUNITY): Payer: Self-pay

## 2010-06-15 NOTE — H&P (Signed)
NAME:  Griego, Ellinore A.                         ACCOUNT NO.:  [id number]   MEDICAL RECORD NO.:  [id number]                   PATIENT TYPE:  OIB   LOCATION:                                       FACILITY:  [facility]   PHYSICIAN:  Peter M. Swaziland, M.D.               DATE OF BIRTH:  20-Jan-1931   DATE OF ADMISSION:  03/10/2003  DATE OF DISCHARGE:                                HISTORY & PHYSICAL   HISTORY OF PRESENT ILLNESS:  Ms. Dimaano is a 77 year old white female who was  seen for evaluation of exertional chest pain. The patient has always been  very active and exercises avidly. However, since mid December, she states  that when she walks she would develop a discomfort in her chest. This is  located in the midsternal region. She notices it when she has been walking  at the mall or walking on a treadmill, but she does not have status post if  she lifts weights or rides her exercise bike. There is some discomfort when  she takes a deep breath. On one occasion she had some nausea but no  vomiting. She has tried antacid therapy without relief. She was subsequently  seen and referred for a stress Cardiolite study. This was performed on  02/072/005. She walked eight minutes and 30 seconds with her typical chest  discomfort symptoms. She had 2 mm of ST segment depression in the inferior  leads. Subsequent Cardiolite images showed a large apical ischemic defect.  Left ventricular function was overall normal with ejection fraction of 61  percent with apical hypokinesia. Based on these abnormal findings, she is  now admitted for cardiac catheterization.   PAST MEDICAL HISTORY:  1. Hypercholesterolemia.  2. History of lupus diagnosed in 1996. The patient has been on chronic     Plaquenil therapy with remission.  3. She has a history of small hiatal hernia by upper endoscopy. She also has     a history of gastric polyp and gastric AVM. There has been no history of     GI bleed. On lower endoscopy, she  has findings of hemorrhoids and     diverticula.   PAST SURGICAL HISTORY:  Hysterectomy and lumpectomy.   ALLERGIES:  PENICILLIN.   CURRENT MEDICATIONS:  1. Aspirin daily.  2. Plaquenil 200 mg b.i.d.  3. Lipitor 40 mg per day.  4. Calcium daily.  5. Toprol XL 50 mg per day.  6. Plavix 75 mg per day.   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 She exercises quite regularly. She denies tobacco or alcohol use. She  is divorced and has two children.   FAMILY HISTORY:  Father died at age 67 with cancer. Mother died age 45 with  myocardial infarction. One brother has had a myocardial infarction. One  sister died with cancer. Two sisters are alive and  well.   REVIEW OF SYSTEMS:  The patient has had no claudication. She has had  previous screening vascular studies in October of 2003. Carotid ultrasound  at that time showed mild to moderate plaque, but no obstructive disease.  Ankle brachial indices were normal. She had no aortic aneurysm. She has no  history of TIA or stroke. No recent change in bowel or bladder habits. Other  review of systems are negative.   PHYSICAL EXAMINATION:  GENERAL: The patient is a pleasant white female in no  apparent distress.  VITAL SIGNS: Blood pressure is 144/90, pulse 70 and regular. Weight is 182.  Respirations normal.  HEENT: Pupils equal, round and reactive to light and accommodation.  Extraocular movements full. Oropharynx clear.  NECK: Without JVD, adenopathy, thyromegaly or bruits.  LUNGS: Clear to auscultation and percussion.  CARDIAC: Regular rate and rhythm without gallops, murmurs, rubs or clicks.  ABDOMEN: Soft, nontender, without masses or bruits.  EXTREMITIES: Without edema. Pulses are 2+ and symmetric.  NEUROLOGIC: The patient is anxious, alert and oriented x 4. She has no focal  findings.   LABORATORY DATA:  ECG shows normal sinus rhythm with nonspecific ST  abnormality.   Chest x-ray  shows no active disease.   Other labs are pending.   IMPRESSION:  1. Angina pectoris with abnormal stress Cardiolite study showing evidence of     apical ischemia.  2. Hypercholesterolemia.  3. Marked family history of early coronary disease.  4. History of small hiatal hernia with gastric polyp and arteriovenous     malformation.  5. History of lupus.   PLAN:  Will admit for cardiac catheterization with further therapies pending  these results.                                                Peter M. Swaziland, M.D.    PMJ/MEDQ  D:  03/09/2003  T:  03/09/2003  Job:  [id number]   cc:   Al Decant. Janey Greaser, MD  [address]  [place]  [place] [address]  Fax: [phone]

## 2010-06-15 NOTE — Cardiovascular Report (Signed)
NAME:  Pamela Gonzales, Pamela Gonzales                          ACCOUNT NO.:  [id number]   MEDICAL RECORD NO.:  [id number]                   PATIENT TYPE:  OIB   LOCATION:  2864                                 FACILITY:  [facility]   PHYSICIAN:  Peter M. Swaziland, M.D.               DATE OF BIRTH:  [birth year]   DATE OF PROCEDURE:  03/10/2003  DATE OF DISCHARGE:                              CARDIAC CATHETERIZATION   INDICATION FOR PROCEDURE:  This is a 77 year old white female with a history  of severe hypercholesterolemia who presents with exertional angina.  She has  an abnormal Cardiolite study showing extensive apical ischemia.   PROCEDURE:  1. Left heart catheterization.  2. Coronary and left ventricular angiography.  3. Stenting of the mid left anterior descending and second obtuse marginal     branches.   ACCESS:  Via the right femoral artery using standard Seldinger technique.   EQUIPMENT:  6-French 4-cm right and left Judkins catheters, 6-French pigtail  catheter, 6-French arterial sheath, 7-French arterial sheath, 7-French JL4  guide, and 0.014 high-torque floppy wire, a 2.5 x 15-mm Quantum Maverick  balloon, 2.5 x 18-mm CYPHER drug-eluding stent for the mid LAD, and a 2.5 x  8-mm CYPHER drug-eluding stent for the second obtuse marginal branch.   CONTRAST:  310 mL of Omnipaque.   MEDICATIONS:  Heparin 4100 units IV with therapeutic ACT.  Nitroglycerin 200  mcg intracoronary x2.  Integrilin double bolus at 0.18 mEq/kg followed by  continuous infusion of 2 mcg/kg per minute.  Plavix 150 mg p.o.   HEMODYNAMIC DATA:  1. Aortic pressure was 130/59 with a mean of 88.  2. Left ventricular pressure was 138 with an EDP of 20 mmHg.   ANGIOGRAPHIC DATA:  The left coronary artery is a large dominant vessel.  The left main coronary is normal.   The left anterior descending artery has a 99% stenosis just past the takeoff  of the first diagonal branch.  This lesion is moderate in length.  The  distal  LAD has 40% narrowing.   The left circumflex coronary is a very large dominant vessel.  It has minor  irregularities.  The second marginal vessel has a focal 80-90% stenosis  proximally.   The right coronary artery is a very small, nondominant vessel.  In the mid  vessel there is a 99% stenosis.  The distal vessel fills by left-to-right  collaterals.  This vessel is only approximately 1 mm in diameter.   Left ventricular angiography in the RAO view demonstrates normal left  ventricular size.  There is focal apical akinesia.  Overall left ventricular  systolic function is well preserved with an ejection fraction estimated at  60%.  There is no mitral regurgitation or prolapse.   We proceeded at this point of intervening on both the LAD and second  marginal lesions.  We initially addressed the LAD.  This lesion was crossed  easily with the guidewire.  We predilated the mid LAD with the 2.5 x 15-mm  Maverick balloon up to 4 atm x2.  We then switched and deployed a 2.5 x 18-  mm CYPHER drug-eluting stent in this segment, deploying the stent to 11 atm  and postdilating to 14 atm.  This resulted in an excellent angiographic  result and 0% residual stenosis and improvement in TIMI flow from 2 to 3.   We then addressed the obtuse marginal vessel lesion.  Using the same wire we  were able to cross the lesion and dilated it with the 2.5 x 15-mm Quantum  Maverick balloon to 12 atm with good expansion.  We then placed a 2.5 x 8-mm  CYPHER drug-eluding stent deploying it at 11 atm and postdilating to 14 atm.  This yielded an excellent result with 0% residual stenosis.   FINAL INTERPRETATION:  1. Three-vessel obstructive atherosclerotic coronary artery disease.  2. Overall well-preserved left ventricular function with apical wall motion     abnormality.  3. Successful stenting of the mid left anterior descending and the second     obtuse marginal branches.                                                Peter M. Swaziland, M.D.    PMJ/MEDQ  D:  03/10/2003  T:  03/11/2003  Job:  [id number]   cc:   Al Decant. Janey Greaser, MD  [address]  [place]  [place] [address]  Fax: [phone]   Cath Lab   Medical Records

## 2010-06-15 NOTE — Discharge Summary (Signed)
NAME:  Pamela Gonzales, Pamela Gonzales                          ACCOUNT NO.:  [id number]   MEDICAL RECORD NO.:  [id number]                   PATIENT TYPE:  OIB   LOCATION:  6523                                 FACILITY:  [facility]   PHYSICIAN:  Peter M. Swaziland, M.D.               DATE OF BIRTH:  01/21/1931   DATE OF ADMISSION:  03/10/2003  DATE OF DISCHARGE:  03/11/2003                                 DISCHARGE SUMMARY   HISTORY OF PRESENT ILLNESS:  Ms. Inscoe is a 77 year old white female who  presented with symptoms of chest discomfort and dyspnea on exertion.  She  has a history of severe hypercholesterolemia.  She subsequently underwent a  stress Cardiolite study which showed a large area of apical ischemia.  She  was admitted for cardiac catheterization.  For details of her Past Medical  History, Social History, Family History, and Physical Examination, please  see admission History and Physical.   LABORATORY DATA:  Coags were normal.  White count 5400, hemoglobin 13.3,  hematocrit 39.8, platelets 200,000.  Sodium 146, potassium 4.5, chloride  104, CO2 32, BUN 18, creatinine 1.0, glucose 93.   ECG was normal.   HOSPITAL COURSE:  The patient underwent coronary angiography on March 10, 2003.  This demonstrated a left-dominant circulation.  There was a 99% mid  LAD stenosis after the first diagonal with TIMI-2 flow.  There was also 80  to 90% focal stenosis in the second obtuse marginal branch.  The right  coronary artery was a very small, non-dominant vessel which had a 99%  stenosis in the mid vessel with left-to-right collaterals.  Left ventricular  angiography demonstrated focal apical akinesia with ejection fraction of  60%.  The patient underwent successful stenting of the mid LAD using a 2.5 x  18 mm Cypher stent.  The second obtuse marginal vessel was also successfully  stented using a 2.5 x 8 mm Cypher stent.  The patient tolerated the  procedure well.   She was treated postprocedure  with IV Integrillin.  She was maintained on  aspirin and Plavix.  She had no complications, no groin hematoma.  Her CBC  remained stable.  She was discharged home the following day.   DISCHARGE DIAGNOSES:  1. Angina pectoris with severe three-vessel coronary artery disease.  2. Successful stenting of the mid left anterior descending  and the second     obtuse marginal vessels.  3. Hypercholesterolemia.  4. Lupus.   DISCHARGE MEDICATIONS:  1. Coated aspirin 325 mg daily.  2. Plavix 75 mg daily.  3. Lipitor 80 mg daily.  4. Toprol XL 50 mg daily.  5. Plaquenil 200 mg twice a day.  6. Calcium daily.   DISCHARGE INSTRUCTIONS:  1. The patient was instructed to avoid heavy lifting or straining for five     days and then will gradually resume her exercise program.  2. The patient is to  remain on a low-fat diet.  3. The patient will follow up with Dr. Swaziland in one week.   DISCHARGE STATUS:  Improved.                                                Peter M. Swaziland, M.D.    PMJ/MEDQ  D:  03/11/2003  T:  03/11/2003  Job:  [id number]   cc:   Al Decant. Janey Greaser, MD  [address]  [place]  [place] [address]  Fax: [phone]

## 2010-06-19 ENCOUNTER — Encounter (HOSPITAL_COMMUNITY): Payer: Self-pay

## 2010-06-19 ENCOUNTER — Telehealth: Payer: Self-pay | Admitting: Cardiology

## 2010-06-19 NOTE — Telephone Encounter (Signed)
Pt called and said she has had a headache above right eyebrow since having nuc done on April 12 please call her back

## 2010-06-19 NOTE — Telephone Encounter (Signed)
Per Dr. Swaziland:  The nuclear agents test has nothing to do with the headache above her eyebrow;  Instructed pt to try Tylenol and to contact PCP if headache persist

## 2010-06-21 ENCOUNTER — Encounter (HOSPITAL_COMMUNITY): Payer: Self-pay

## 2010-06-22 ENCOUNTER — Encounter (HOSPITAL_COMMUNITY): Payer: Self-pay

## 2010-06-26 ENCOUNTER — Encounter (HOSPITAL_COMMUNITY): Payer: Self-pay

## 2010-06-28 ENCOUNTER — Encounter (HOSPITAL_COMMUNITY): Payer: Self-pay

## 2010-06-29 ENCOUNTER — Telehealth: Payer: Self-pay | Admitting: Cardiology

## 2010-06-29 ENCOUNTER — Encounter (HOSPITAL_COMMUNITY): Payer: Self-pay | Attending: Cardiology

## 2010-06-29 DIAGNOSIS — E7800 Pure hypercholesterolemia, unspecified: Secondary | ICD-10-CM | POA: Insufficient documentation

## 2010-06-29 DIAGNOSIS — I2510 Atherosclerotic heart disease of native coronary artery without angina pectoris: Secondary | ICD-10-CM | POA: Insufficient documentation

## 2010-06-29 DIAGNOSIS — Z9861 Coronary angioplasty status: Secondary | ICD-10-CM | POA: Insufficient documentation

## 2010-06-29 DIAGNOSIS — Z5189 Encounter for other specified aftercare: Secondary | ICD-10-CM | POA: Insufficient documentation

## 2010-06-29 DIAGNOSIS — Z8249 Family history of ischemic heart disease and other diseases of the circulatory system: Secondary | ICD-10-CM | POA: Insufficient documentation

## 2010-06-29 DIAGNOSIS — M329 Systemic lupus erythematosus, unspecified: Secondary | ICD-10-CM | POA: Insufficient documentation

## 2010-06-29 NOTE — Telephone Encounter (Signed)
Called stating eye MD wanted her to take fish oil because her eyes were dry. OK per Dr. Swaziland. Advised she could take 1000 mg. Left her message.

## 2010-06-29 NOTE — Telephone Encounter (Signed)
Had dry eyes and her optomoligist advised her to take Fish oil with omega-3 and she wanted to know if that would be ok and what the dosage would be. Please call back and feel free to leave a message. I have pulled the chart

## 2010-07-03 ENCOUNTER — Encounter (HOSPITAL_COMMUNITY): Payer: Self-pay

## 2010-07-05 ENCOUNTER — Encounter (HOSPITAL_COMMUNITY): Payer: Self-pay

## 2010-07-06 ENCOUNTER — Encounter (HOSPITAL_COMMUNITY): Payer: Self-pay

## 2010-07-10 ENCOUNTER — Encounter (HOSPITAL_COMMUNITY): Payer: Self-pay

## 2010-07-11 ENCOUNTER — Encounter: Payer: Self-pay | Admitting: Cardiology

## 2010-07-12 ENCOUNTER — Encounter (HOSPITAL_COMMUNITY): Payer: Self-pay

## 2010-07-13 ENCOUNTER — Encounter (HOSPITAL_COMMUNITY): Payer: Self-pay

## 2010-07-17 ENCOUNTER — Encounter (HOSPITAL_COMMUNITY): Payer: Self-pay

## 2010-07-19 ENCOUNTER — Encounter (HOSPITAL_COMMUNITY): Payer: Self-pay

## 2010-07-20 ENCOUNTER — Encounter (HOSPITAL_COMMUNITY): Payer: Self-pay

## 2010-07-24 ENCOUNTER — Encounter (HOSPITAL_COMMUNITY): Payer: Self-pay

## 2010-07-26 ENCOUNTER — Encounter (HOSPITAL_COMMUNITY): Payer: Self-pay

## 2010-07-27 ENCOUNTER — Encounter (HOSPITAL_COMMUNITY): Payer: Self-pay

## 2010-07-31 ENCOUNTER — Encounter (HOSPITAL_COMMUNITY): Payer: Self-pay | Attending: Cardiology

## 2010-07-31 DIAGNOSIS — M329 Systemic lupus erythematosus, unspecified: Secondary | ICD-10-CM | POA: Insufficient documentation

## 2010-07-31 DIAGNOSIS — Z9861 Coronary angioplasty status: Secondary | ICD-10-CM | POA: Insufficient documentation

## 2010-07-31 DIAGNOSIS — E7800 Pure hypercholesterolemia, unspecified: Secondary | ICD-10-CM | POA: Insufficient documentation

## 2010-07-31 DIAGNOSIS — Z5189 Encounter for other specified aftercare: Secondary | ICD-10-CM | POA: Insufficient documentation

## 2010-07-31 DIAGNOSIS — Z8249 Family history of ischemic heart disease and other diseases of the circulatory system: Secondary | ICD-10-CM | POA: Insufficient documentation

## 2010-07-31 DIAGNOSIS — I2510 Atherosclerotic heart disease of native coronary artery without angina pectoris: Secondary | ICD-10-CM | POA: Insufficient documentation

## 2010-08-02 ENCOUNTER — Encounter (HOSPITAL_COMMUNITY): Payer: Self-pay

## 2010-08-03 ENCOUNTER — Encounter (HOSPITAL_COMMUNITY): Payer: Self-pay

## 2010-08-07 ENCOUNTER — Encounter (HOSPITAL_COMMUNITY): Payer: Self-pay

## 2010-08-09 ENCOUNTER — Encounter (HOSPITAL_COMMUNITY): Payer: Self-pay

## 2010-08-10 ENCOUNTER — Encounter (HOSPITAL_COMMUNITY): Payer: Self-pay

## 2010-08-14 ENCOUNTER — Encounter (HOSPITAL_COMMUNITY): Payer: Self-pay

## 2010-08-16 ENCOUNTER — Encounter (HOSPITAL_COMMUNITY): Payer: Self-pay

## 2010-08-17 ENCOUNTER — Encounter (HOSPITAL_COMMUNITY): Payer: Self-pay

## 2010-08-18 ENCOUNTER — Other Ambulatory Visit: Payer: Self-pay | Admitting: Cardiology

## 2010-08-20 NOTE — Telephone Encounter (Signed)
escribe medication per fax request  

## 2010-08-21 ENCOUNTER — Telehealth: Payer: Self-pay | Admitting: Cardiology

## 2010-08-21 ENCOUNTER — Encounter (HOSPITAL_COMMUNITY): Payer: Self-pay

## 2010-08-21 NOTE — Telephone Encounter (Signed)
Called to schedule 6 mov. Scheduled for Oct

## 2010-08-21 NOTE — Telephone Encounter (Signed)
Called today confused because she believe that Leanne called her to schedule an appointment as soon as possible. But there is no recall appointment in the system though looking at her last [facility]) from April say that she was due in for a one month visit. Please call back at your earliest convenience. I have pulled the chart.

## 2010-08-23 ENCOUNTER — Encounter (HOSPITAL_COMMUNITY): Payer: Self-pay

## 2010-08-24 ENCOUNTER — Encounter (HOSPITAL_COMMUNITY): Payer: Self-pay

## 2010-08-28 ENCOUNTER — Encounter (HOSPITAL_COMMUNITY): Payer: Self-pay

## 2010-08-30 ENCOUNTER — Encounter (HOSPITAL_COMMUNITY): Payer: Self-pay | Attending: Cardiology

## 2010-08-30 DIAGNOSIS — M329 Systemic lupus erythematosus, unspecified: Secondary | ICD-10-CM | POA: Insufficient documentation

## 2010-08-30 DIAGNOSIS — E7800 Pure hypercholesterolemia, unspecified: Secondary | ICD-10-CM | POA: Insufficient documentation

## 2010-08-30 DIAGNOSIS — Z5189 Encounter for other specified aftercare: Secondary | ICD-10-CM | POA: Insufficient documentation

## 2010-08-30 DIAGNOSIS — Z9861 Coronary angioplasty status: Secondary | ICD-10-CM | POA: Insufficient documentation

## 2010-08-30 DIAGNOSIS — Z8249 Family history of ischemic heart disease and other diseases of the circulatory system: Secondary | ICD-10-CM | POA: Insufficient documentation

## 2010-08-30 DIAGNOSIS — I2510 Atherosclerotic heart disease of native coronary artery without angina pectoris: Secondary | ICD-10-CM | POA: Insufficient documentation

## 2010-08-31 ENCOUNTER — Encounter (HOSPITAL_COMMUNITY): Payer: Self-pay

## 2010-09-04 ENCOUNTER — Encounter (HOSPITAL_COMMUNITY): Payer: Self-pay

## 2010-09-06 ENCOUNTER — Encounter (HOSPITAL_COMMUNITY): Payer: Self-pay

## 2010-09-07 ENCOUNTER — Encounter (HOSPITAL_COMMUNITY): Payer: Self-pay

## 2010-09-11 ENCOUNTER — Encounter (HOSPITAL_COMMUNITY): Payer: Self-pay

## 2010-09-13 ENCOUNTER — Encounter (HOSPITAL_COMMUNITY): Payer: Self-pay

## 2010-09-14 ENCOUNTER — Encounter (HOSPITAL_COMMUNITY): Payer: Self-pay

## 2010-09-18 ENCOUNTER — Encounter (HOSPITAL_COMMUNITY): Payer: Self-pay

## 2010-09-20 ENCOUNTER — Encounter (HOSPITAL_COMMUNITY): Payer: Self-pay

## 2010-09-21 ENCOUNTER — Encounter (HOSPITAL_COMMUNITY): Payer: Self-pay

## 2010-09-25 ENCOUNTER — Encounter (HOSPITAL_COMMUNITY): Payer: Self-pay

## 2010-09-27 ENCOUNTER — Encounter (HOSPITAL_COMMUNITY): Payer: Self-pay

## 2010-09-28 ENCOUNTER — Encounter (HOSPITAL_COMMUNITY): Payer: Self-pay

## 2010-09-30 ENCOUNTER — Other Ambulatory Visit: Payer: Self-pay | Admitting: Cardiology

## 2010-10-02 ENCOUNTER — Encounter (HOSPITAL_COMMUNITY): Payer: Self-pay | Attending: Cardiology

## 2010-10-02 DIAGNOSIS — Z5189 Encounter for other specified aftercare: Secondary | ICD-10-CM | POA: Insufficient documentation

## 2010-10-02 DIAGNOSIS — Z8249 Family history of ischemic heart disease and other diseases of the circulatory system: Secondary | ICD-10-CM | POA: Insufficient documentation

## 2010-10-02 DIAGNOSIS — M329 Systemic lupus erythematosus, unspecified: Secondary | ICD-10-CM | POA: Insufficient documentation

## 2010-10-02 DIAGNOSIS — I2510 Atherosclerotic heart disease of native coronary artery without angina pectoris: Secondary | ICD-10-CM | POA: Insufficient documentation

## 2010-10-02 DIAGNOSIS — E7800 Pure hypercholesterolemia, unspecified: Secondary | ICD-10-CM | POA: Insufficient documentation

## 2010-10-02 DIAGNOSIS — Z9861 Coronary angioplasty status: Secondary | ICD-10-CM | POA: Insufficient documentation

## 2010-10-04 ENCOUNTER — Encounter (HOSPITAL_COMMUNITY): Payer: Self-pay

## 2010-10-05 ENCOUNTER — Encounter (HOSPITAL_COMMUNITY): Payer: Self-pay

## 2010-10-09 ENCOUNTER — Other Ambulatory Visit: Payer: Self-pay | Admitting: Obstetrics and Gynecology

## 2010-10-09 ENCOUNTER — Encounter (HOSPITAL_COMMUNITY): Payer: Self-pay

## 2010-10-09 DIAGNOSIS — Z1231 Encounter for screening mammogram for malignant neoplasm of breast: Secondary | ICD-10-CM

## 2010-10-11 ENCOUNTER — Encounter (HOSPITAL_COMMUNITY): Payer: Self-pay

## 2010-10-12 ENCOUNTER — Encounter (HOSPITAL_COMMUNITY): Payer: Self-pay

## 2010-10-16 ENCOUNTER — Encounter (HOSPITAL_COMMUNITY): Payer: Self-pay

## 2010-10-18 ENCOUNTER — Other Ambulatory Visit: Payer: Self-pay | Admitting: Cardiology

## 2010-10-18 ENCOUNTER — Encounter (HOSPITAL_COMMUNITY): Payer: Self-pay

## 2010-10-18 NOTE — Telephone Encounter (Signed)
Refilled Meds from fax  

## 2010-10-19 ENCOUNTER — Encounter (HOSPITAL_COMMUNITY): Payer: Self-pay

## 2010-10-23 ENCOUNTER — Encounter (HOSPITAL_COMMUNITY): Payer: Self-pay

## 2010-10-23 LAB — CBC
HCT: 37.7
Hemoglobin: 13.3
MCHC: 35.2
RBC: 4.02
RDW: 11.9

## 2010-10-23 LAB — COMPREHENSIVE METABOLIC PANEL
Alkaline Phosphatase: 51
BUN: 9
CO2: 28
GFR calc non Af Amer: >60
Glucose, Bld: 87
Potassium: 3.8
Total Protein: 6

## 2010-10-23 LAB — DIFFERENTIAL
Basophils Absolute: 0
Basophils Relative: 0
Eosinophils Absolute: 0
Monocytes Relative: 14 — ABNORMAL HIGH
Neutro Abs: 8.9 — ABNORMAL HIGH
Neutrophils Relative %: 79 — ABNORMAL HIGH

## 2010-10-23 LAB — URINALYSIS, ROUTINE W REFLEX MICROSCOPIC
Hgb urine dipstick: NEGATIVE
Nitrite: NEGATIVE
Protein, ur: NEGATIVE
Specific Gravity, Urine: 1.01
Urobilinogen, UA: 0.2

## 2010-10-23 LAB — URINE CULTURE

## 2010-10-23 LAB — STOOL CULTURE

## 2010-10-25 ENCOUNTER — Encounter (HOSPITAL_COMMUNITY): Payer: Self-pay

## 2010-10-26 ENCOUNTER — Encounter (HOSPITAL_COMMUNITY): Payer: Self-pay

## 2010-10-30 ENCOUNTER — Encounter (HOSPITAL_COMMUNITY): Payer: Self-pay | Attending: Cardiology

## 2010-10-30 DIAGNOSIS — I2510 Atherosclerotic heart disease of native coronary artery without angina pectoris: Secondary | ICD-10-CM | POA: Insufficient documentation

## 2010-10-30 DIAGNOSIS — Z5189 Encounter for other specified aftercare: Secondary | ICD-10-CM | POA: Insufficient documentation

## 2010-10-30 DIAGNOSIS — Z8249 Family history of ischemic heart disease and other diseases of the circulatory system: Secondary | ICD-10-CM | POA: Insufficient documentation

## 2010-10-30 DIAGNOSIS — E7800 Pure hypercholesterolemia, unspecified: Secondary | ICD-10-CM | POA: Insufficient documentation

## 2010-10-30 DIAGNOSIS — Z9861 Coronary angioplasty status: Secondary | ICD-10-CM | POA: Insufficient documentation

## 2010-10-30 DIAGNOSIS — M329 Systemic lupus erythematosus, unspecified: Secondary | ICD-10-CM | POA: Insufficient documentation

## 2010-11-01 ENCOUNTER — Encounter (HOSPITAL_COMMUNITY): Payer: Self-pay

## 2010-11-02 ENCOUNTER — Encounter (HOSPITAL_COMMUNITY): Payer: Self-pay

## 2010-11-06 ENCOUNTER — Encounter (HOSPITAL_COMMUNITY): Payer: Self-pay

## 2010-11-08 ENCOUNTER — Encounter (HOSPITAL_COMMUNITY): Payer: Self-pay

## 2010-11-09 ENCOUNTER — Encounter (HOSPITAL_COMMUNITY): Payer: Self-pay

## 2010-11-13 ENCOUNTER — Encounter (HOSPITAL_COMMUNITY): Payer: Self-pay

## 2010-11-13 ENCOUNTER — Telehealth: Payer: Self-pay | Admitting: Cardiology

## 2010-11-13 DIAGNOSIS — E7800 Pure hypercholesterolemia, unspecified: Secondary | ICD-10-CM

## 2010-11-13 DIAGNOSIS — I2510 Atherosclerotic heart disease of native coronary artery without angina pectoris: Secondary | ICD-10-CM

## 2010-11-13 NOTE — Telephone Encounter (Signed)
Called requesting fasting lab be done at [facility] on 10/18 including Vit D. Per Dr. Swaziland can do fasting LP;bmet;cbc,hfp but not Vit D because insurance won't pay. Lm for her to come 30 min prior to app for fasting labs.

## 2010-11-13 NOTE — Telephone Encounter (Signed)
Pt calling wanting to know if pt needs to come in early Thursday fasting and have blood work done.   Pt would also like Vitamin D test done, when blood is drawn.   Please return pt call to discuss further.

## 2010-11-15 ENCOUNTER — Encounter (HOSPITAL_COMMUNITY): Payer: Self-pay

## 2010-11-15 ENCOUNTER — Ambulatory Visit (INDEPENDENT_AMBULATORY_CARE_PROVIDER_SITE_OTHER): Payer: Medicare Other | Admitting: Cardiology

## 2010-11-15 ENCOUNTER — Encounter: Payer: Self-pay | Admitting: Cardiology

## 2010-11-15 ENCOUNTER — Other Ambulatory Visit (INDEPENDENT_AMBULATORY_CARE_PROVIDER_SITE_OTHER): Payer: Medicare Other | Admitting: *Deleted

## 2010-11-15 ENCOUNTER — Other Ambulatory Visit: Payer: Self-pay | Admitting: *Deleted

## 2010-11-15 VITALS — BP 139/84 | HR 76 | Ht 64.0 in | Wt 184.8 lb

## 2010-11-15 DIAGNOSIS — E669 Obesity, unspecified: Secondary | ICD-10-CM

## 2010-11-15 DIAGNOSIS — I2510 Atherosclerotic heart disease of native coronary artery without angina pectoris: Secondary | ICD-10-CM

## 2010-11-15 DIAGNOSIS — E7800 Pure hypercholesterolemia, unspecified: Secondary | ICD-10-CM

## 2010-11-15 DIAGNOSIS — I10 Essential (primary) hypertension: Secondary | ICD-10-CM

## 2010-11-15 DIAGNOSIS — E785 Hyperlipidemia, unspecified: Secondary | ICD-10-CM

## 2010-11-15 LAB — LIPID PANEL
LDL Cholesterol: 69 mg/dL (ref 0–99)
VLDL: 13 mg/dL (ref 0.0–40.0)

## 2010-11-15 LAB — CBC WITH DIFFERENTIAL/PLATELET
Basophils Relative: 0.3 % (ref 0.0–3.0)
Eosinophils Relative: 0 % (ref 0.0–5.0)
HCT: 40.2 % (ref 36.0–46.0)
MCV: 96.9 fl (ref 78.0–100.0)
Monocytes Absolute: 0.6 10*3/uL (ref 0.1–1.0)
Neutrophils Relative %: 68.7 % (ref 43.0–77.0)
RBC: 4.15 Mil/uL (ref 3.87–5.11)
WBC: 5 10*3/uL (ref 4.5–10.5)

## 2010-11-15 LAB — HEPATIC FUNCTION PANEL
Bilirubin, Direct: 0.2 mg/dL (ref 0.0–0.3)
Total Bilirubin: 0.9 mg/dL (ref 0.3–1.2)

## 2010-11-15 LAB — BASIC METABOLIC PANEL
BUN: 19 mg/dL (ref 6–23)
GFR: 87.4 mL/min (ref >60.00)
Glucose, Bld: 82 mg/dL (ref 70–99)
Potassium: 4.2 mEq/L (ref 3.5–5.1)

## 2010-11-15 NOTE — Assessment & Plan Note (Signed)
We discussed strategies for weight loss as noted in her instructions. She needs to continue with her aerobic activity.

## 2010-11-15 NOTE — Progress Notes (Signed)
HPI Pamela Gonzales is seen for followup today. She is concerned about intermittent spikes in her blood pressure. For the most part her blood pressure has been well controlled at cardiac rehabilitation. She has gained almost 11 pounds since her last visit. She is currently living at any [place] [facility] community and she is fed very well. She reports and she is exercising more than she ever has. She does have chronic back pain. She denies any chest pain, shortness of breath, or palpitations.  Allergies  Allergen Reactions  . Keflex   . Penicillins   . Sulfa Drugs Cross Reactors     Current Outpatient Prescriptions on File Prior to Visit  Medication Sig Dispense Refill  . aspirin 81 MG tablet Take 81 mg by mouth daily.        Marland Kitchen atorvastatin (LIPITOR) 40 MG tablet TAKE 1 TABLET EVERY DAY  30 tablet  5  . calcitonin, salmon, (MIACALCIN) 200 UNIT/ACT nasal spray 1 spray by Nasal route daily.        . calcium carbonate 200 MG capsule Take 600 mg by mouth 2 (two) times daily.       . Cholecalciferol (VITAMIN D) 1000 UNITS capsule Take 1,000 Units by mouth daily.        . clopidogrel (PLAVIX) 75 MG tablet TAKE 1 TABLET EVERY DAY  30 tablet  5  . folic acid (FOLVITE) 1 MG tablet TAKE 1 TABLET TWICE DAILY  60 tablet  5  . hydroxychloroquine (PLAQUENIL) 200 MG tablet Take by mouth daily.        . Multiple Vitamins-Minerals (MULTIVITAMIN) tablet Take 1 tablet by mouth daily.  30 tablet    . NASONEX 50 MCG/ACT nasal spray 2 sprays by Nasal route Daily.      . nitroGLYCERIN (NITROSTAT) 0.4 MG SL tablet Place 1 tablet (0.4 mg total) under the tongue every 5 (five) minutes as needed.  25 tablet  3    Past Medical History  Diagnosis Date  . CAD (coronary artery disease)   . Hypercholesteremia   . Lupus   . GERD (gastroesophageal reflux disease)   . Spinal stenosis   . Fibula fracture     right    Past Surgical History  Procedure Date  . Abdominal hysterectomy   . Breast lumpectomy   . Coronary  angioplasty     Family History  Problem Relation Age of Onset  . Cancer Father   . Heart attack Mother   . Heart attack Brother   . Cancer Sister   . Cancer Sister     History   Social History  . Marital Status: Divorced    Spouse Name: N/A    Number of Children: 2  . Years of Education: N/A   Occupational History  . insurance sales     retired   Social History Main Topics  . Smoking status: Never Smoker   . Smokeless tobacco: Not on file  . Alcohol Use: No  . Drug Use: No  . Sexually Active: Not on file   Other Topics Concern  . Not on file   Social History Narrative  . No narrative on file    ROS  review of systems is positive for headache. She reports this developed in April. She was seen by ENT and had fairly extensive evaluation. I think it was decided that her pain was more musculoskeletal. It has improved.  All other systems were reviewed and are negative.  PHYSICAL EXAM BP 139/84  Pulse 76  Ht 5\' 4"  (1.626 m)  Wt 184 lb 12.8 oz (83.825 kg)  BMI 31.72 kg/m2 The patient is alert and oriented x 3.  The mood and affect are normal.  The skin is warm and dry.  Color is normal.  The HEENT exam reveals that the sclera are nonicteric.  The mucous membranes are moist.  The carotids are 2+ without bruits.  There is no thyromegaly.  There is no JVD.  The lungs are clear.  The chest wall is non tender.  The heart exam reveals a regular rate with a normal S1 and S2.  There are no murmurs, gallops, or rubs.  The PMI is not displaced.   Abdominal exam reveals good bowel sounds.  There is no guarding or rebound.  There is no hepatosplenomegaly or tenderness.  There are no masses.  Exam of the legs reveal no clubbing, cyanosis, or edema.  The legs are without rashes.  The distal pulses are intact.  Cranial nerves II - XII are intact.  Motor and sensory functions are intact.  The gait is normal.  Laboratory data: Chemistries and lipid panel are pending today.  ASSESSMENT AND  PLAN

## 2010-11-15 NOTE — Patient Instructions (Signed)
Continue your current medications.  We will call with the results of your lab work today.  Focus on loosing weight. Reduce intake of sweets and simple carbohydrates ( white foods--rice, pasta,potatoes,and bread) Increase intake of fresh fruits and vegetables. Decrease portion sizes.  I will see you again in 6 months.

## 2010-11-15 NOTE — Assessment & Plan Note (Signed)
She is having no significant anginal symptoms. Her stress Myoview in April showed only a small fixed apical defect. Her ejection fraction was normal. We will continue with her dual antiplatelet therapy and followup again in 6 months.

## 2010-11-15 NOTE — Assessment & Plan Note (Signed)
We will followup on her fasting lab work today and adjust as needed.

## 2010-11-16 ENCOUNTER — Ambulatory Visit
Admission: RE | Admit: 2010-11-16 | Discharge: 2010-11-16 | Disposition: A | Discharge status: Home / Self Care | Payer: Medicare Other | Source: Ambulatory Visit | Attending: Obstetrics and Gynecology | Admitting: Obstetrics and Gynecology

## 2010-11-16 ENCOUNTER — Encounter (HOSPITAL_COMMUNITY): Payer: Self-pay

## 2010-11-16 DIAGNOSIS — Z1231 Encounter for screening mammogram for malignant neoplasm of breast: Secondary | ICD-10-CM

## 2010-11-19 ENCOUNTER — Telehealth: Payer: Self-pay | Admitting: *Deleted

## 2010-11-19 NOTE — Telephone Encounter (Signed)
Notified of lab results. Will mail pt a copy.

## 2010-11-19 NOTE — Telephone Encounter (Signed)
Message copied by Lorayne Bender on Mon Nov 19, 2010  9:32 AM ------      Message from: [place], PETER M      Created: Thu Nov 15, 2010  2:03 PM       Chemistries are normal. Lipids look excellent. CBC is normal except slightly low platelets.      Theron Arista [place]

## 2010-11-20 ENCOUNTER — Encounter (HOSPITAL_COMMUNITY): Payer: Self-pay

## 2010-11-22 ENCOUNTER — Telehealth: Payer: Self-pay | Admitting: Cardiology

## 2010-11-22 ENCOUNTER — Encounter (HOSPITAL_COMMUNITY): Payer: Self-pay

## 2010-11-22 NOTE — Telephone Encounter (Signed)
Platelet count has always been in normal range until this last test and was only mildly decreased. We just need to follow. Pamela Gonzales

## 2010-11-22 NOTE — Telephone Encounter (Signed)
Pamela Gonzales calling stating she had received her lab results from dr jordon, and her platelets are low and over last 3 years has continued to slowly come down --advised i would send message to dr jordon and advise him you have concerns --pt agrees--nt

## 2010-11-22 NOTE — Telephone Encounter (Signed)
Pt calling re questions she has for nurse

## 2010-11-23 ENCOUNTER — Encounter (HOSPITAL_COMMUNITY): Payer: Self-pay

## 2010-11-27 ENCOUNTER — Encounter (HOSPITAL_COMMUNITY): Payer: Self-pay

## 2010-11-29 ENCOUNTER — Encounter (HOSPITAL_COMMUNITY)
Admission: RE | Admit: 2010-11-29 | Discharge: 2010-11-29 | Payer: Medicare Other | Source: Ambulatory Visit | Attending: Cardiology | Admitting: Cardiology

## 2010-11-29 DIAGNOSIS — M329 Systemic lupus erythematosus, unspecified: Secondary | ICD-10-CM | POA: Insufficient documentation

## 2010-11-29 DIAGNOSIS — I2510 Atherosclerotic heart disease of native coronary artery without angina pectoris: Secondary | ICD-10-CM | POA: Insufficient documentation

## 2010-11-29 DIAGNOSIS — E7800 Pure hypercholesterolemia, unspecified: Secondary | ICD-10-CM | POA: Insufficient documentation

## 2010-11-29 DIAGNOSIS — Z9861 Coronary angioplasty status: Secondary | ICD-10-CM | POA: Insufficient documentation

## 2010-11-29 DIAGNOSIS — Z5189 Encounter for other specified aftercare: Secondary | ICD-10-CM | POA: Insufficient documentation

## 2010-11-29 DIAGNOSIS — Z8249 Family history of ischemic heart disease and other diseases of the circulatory system: Secondary | ICD-10-CM | POA: Insufficient documentation

## 2010-11-30 ENCOUNTER — Encounter (HOSPITAL_COMMUNITY): Payer: Medicare Other

## 2010-12-04 ENCOUNTER — Encounter (HOSPITAL_COMMUNITY): Payer: Medicare Other

## 2010-12-06 ENCOUNTER — Encounter (HOSPITAL_COMMUNITY): Payer: Medicare Other

## 2010-12-07 ENCOUNTER — Encounter (HOSPITAL_COMMUNITY): Payer: Medicare Other

## 2010-12-11 ENCOUNTER — Encounter (HOSPITAL_COMMUNITY): Payer: Medicare Other

## 2010-12-13 ENCOUNTER — Encounter (HOSPITAL_COMMUNITY): Payer: Medicare Other

## 2010-12-14 ENCOUNTER — Encounter (HOSPITAL_COMMUNITY): Payer: Medicare Other

## 2010-12-18 ENCOUNTER — Encounter (HOSPITAL_COMMUNITY)
Admission: RE | Admit: 2010-12-18 | Discharge: 2010-12-18 | Disposition: A | Discharge status: Home / Self Care | Payer: Medicare Other | Source: Ambulatory Visit | Attending: Cardiology | Admitting: Cardiology

## 2010-12-20 ENCOUNTER — Encounter (HOSPITAL_COMMUNITY): Payer: Medicare Other

## 2010-12-21 ENCOUNTER — Encounter (HOSPITAL_COMMUNITY): Payer: Medicare Other

## 2010-12-25 ENCOUNTER — Encounter (HOSPITAL_COMMUNITY): Payer: Medicare Other

## 2010-12-27 ENCOUNTER — Encounter (HOSPITAL_COMMUNITY)
Admission: RE | Admit: 2010-12-27 | Discharge: 2010-12-27 | Disposition: A | Discharge status: Home / Self Care | Payer: Medicare Other | Source: Ambulatory Visit | Attending: Cardiology | Admitting: Cardiology

## 2010-12-28 ENCOUNTER — Encounter (HOSPITAL_COMMUNITY): Payer: Medicare Other

## 2011-01-01 ENCOUNTER — Other Ambulatory Visit: Payer: Self-pay | Admitting: Gastroenterology

## 2011-01-01 ENCOUNTER — Encounter (HOSPITAL_COMMUNITY)
Admission: RE | Admit: 2011-01-01 | Discharge: 2011-01-01 | Disposition: A | Discharge status: Home / Self Care | Payer: Self-pay | Source: Ambulatory Visit | Attending: Cardiology | Admitting: Cardiology

## 2011-01-01 DIAGNOSIS — M329 Systemic lupus erythematosus, unspecified: Secondary | ICD-10-CM | POA: Insufficient documentation

## 2011-01-01 DIAGNOSIS — Z8249 Family history of ischemic heart disease and other diseases of the circulatory system: Secondary | ICD-10-CM | POA: Insufficient documentation

## 2011-01-01 DIAGNOSIS — E7800 Pure hypercholesterolemia, unspecified: Secondary | ICD-10-CM | POA: Insufficient documentation

## 2011-01-01 DIAGNOSIS — Z5189 Encounter for other specified aftercare: Secondary | ICD-10-CM | POA: Insufficient documentation

## 2011-01-01 DIAGNOSIS — Z9861 Coronary angioplasty status: Secondary | ICD-10-CM | POA: Insufficient documentation

## 2011-01-01 DIAGNOSIS — R918 Other nonspecific abnormal finding of lung field: Secondary | ICD-10-CM

## 2011-01-01 DIAGNOSIS — I2510 Atherosclerotic heart disease of native coronary artery without angina pectoris: Secondary | ICD-10-CM | POA: Insufficient documentation

## 2011-01-03 ENCOUNTER — Encounter (HOSPITAL_COMMUNITY)
Admission: RE | Admit: 2011-01-03 | Discharge: 2011-01-03 | Disposition: A | Discharge status: Home / Self Care | Payer: Self-pay | Source: Ambulatory Visit | Attending: Cardiology | Admitting: Cardiology

## 2011-01-04 ENCOUNTER — Encounter (HOSPITAL_COMMUNITY): Payer: Self-pay

## 2011-01-07 ENCOUNTER — Other Ambulatory Visit: Payer: Medicare Other

## 2011-01-08 ENCOUNTER — Encounter (HOSPITAL_COMMUNITY)
Admission: RE | Admit: 2011-01-08 | Discharge: 2011-01-08 | Disposition: A | Discharge status: Home / Self Care | Payer: Medicare Other | Source: Ambulatory Visit | Attending: Cardiology | Admitting: Cardiology

## 2011-01-10 ENCOUNTER — Encounter (HOSPITAL_COMMUNITY)
Admission: RE | Admit: 2011-01-10 | Discharge: 2011-01-10 | Disposition: A | Discharge status: Home / Self Care | Payer: Medicare Other | Source: Ambulatory Visit | Attending: Cardiology | Admitting: Cardiology

## 2011-01-11 ENCOUNTER — Encounter (HOSPITAL_COMMUNITY): Payer: Self-pay

## 2011-01-15 ENCOUNTER — Encounter (HOSPITAL_COMMUNITY)
Admission: RE | Admit: 2011-01-15 | Discharge: 2011-01-15 | Disposition: A | Discharge status: Home / Self Care | Payer: Self-pay | Source: Ambulatory Visit | Attending: Cardiology | Admitting: Cardiology

## 2011-01-17 ENCOUNTER — Encounter (HOSPITAL_COMMUNITY)
Admission: RE | Admit: 2011-01-17 | Discharge: 2011-01-17 | Disposition: A | Discharge status: Home / Self Care | Payer: Medicare Other | Source: Ambulatory Visit | Attending: Cardiology | Admitting: Cardiology

## 2011-01-18 ENCOUNTER — Encounter (HOSPITAL_COMMUNITY): Payer: Medicare Other

## 2011-01-22 ENCOUNTER — Encounter (HOSPITAL_COMMUNITY): Payer: Medicare Other

## 2011-01-24 ENCOUNTER — Encounter (HOSPITAL_COMMUNITY): Payer: Medicare Other

## 2011-01-25 ENCOUNTER — Encounter (HOSPITAL_COMMUNITY): Payer: Self-pay

## 2011-01-28 ENCOUNTER — Ambulatory Visit
Admission: RE | Admit: 2011-01-28 | Discharge: 2011-01-28 | Disposition: A | Discharge status: Home / Self Care | Payer: Medicare Other | Source: Ambulatory Visit | Attending: Gastroenterology | Admitting: Gastroenterology

## 2011-01-28 DIAGNOSIS — R918 Other nonspecific abnormal finding of lung field: Secondary | ICD-10-CM

## 2011-01-28 MED ORDER — IOHEXOL 300 MG/ML  SOLN
75.0000 mL | Freq: Once | INTRAMUSCULAR | Status: AC | PRN
  Administered 2011-01-28: 75 mL via INTRAVENOUS

## 2011-01-29 ENCOUNTER — Encounter (HOSPITAL_COMMUNITY): Payer: Self-pay

## 2011-01-31 ENCOUNTER — Encounter (HOSPITAL_COMMUNITY)
Admission: RE | Admit: 2011-01-31 | Discharge: 2011-01-31 | Disposition: A | Discharge status: Home / Self Care | Payer: Self-pay | Source: Ambulatory Visit | Attending: Cardiology | Admitting: Cardiology

## 2011-01-31 DIAGNOSIS — Z8249 Family history of ischemic heart disease and other diseases of the circulatory system: Secondary | ICD-10-CM | POA: Insufficient documentation

## 2011-01-31 DIAGNOSIS — Z5189 Encounter for other specified aftercare: Secondary | ICD-10-CM | POA: Insufficient documentation

## 2011-01-31 DIAGNOSIS — E7800 Pure hypercholesterolemia, unspecified: Secondary | ICD-10-CM | POA: Insufficient documentation

## 2011-01-31 DIAGNOSIS — M329 Systemic lupus erythematosus, unspecified: Secondary | ICD-10-CM | POA: Insufficient documentation

## 2011-01-31 DIAGNOSIS — Z9861 Coronary angioplasty status: Secondary | ICD-10-CM | POA: Insufficient documentation

## 2011-01-31 DIAGNOSIS — I2510 Atherosclerotic heart disease of native coronary artery without angina pectoris: Secondary | ICD-10-CM | POA: Insufficient documentation

## 2011-02-01 ENCOUNTER — Encounter (HOSPITAL_COMMUNITY): Payer: Self-pay

## 2011-02-05 ENCOUNTER — Encounter (HOSPITAL_COMMUNITY)
Admission: RE | Admit: 2011-02-05 | Discharge: 2011-02-05 | Disposition: A | Discharge status: Home / Self Care | Payer: Self-pay | Source: Ambulatory Visit | Attending: Cardiology | Admitting: Cardiology

## 2011-02-07 ENCOUNTER — Encounter (HOSPITAL_COMMUNITY)
Admission: RE | Admit: 2011-02-07 | Discharge: 2011-02-07 | Disposition: A | Discharge status: Home / Self Care | Payer: Self-pay | Source: Ambulatory Visit | Attending: Cardiology | Admitting: Cardiology

## 2011-02-08 ENCOUNTER — Encounter (HOSPITAL_COMMUNITY): Payer: Self-pay

## 2011-02-12 ENCOUNTER — Encounter (HOSPITAL_COMMUNITY)
Admission: RE | Admit: 2011-02-12 | Discharge: 2011-02-12 | Disposition: A | Discharge status: Home / Self Care | Payer: Self-pay | Source: Ambulatory Visit | Attending: Cardiology | Admitting: Cardiology

## 2011-02-12 ENCOUNTER — Other Ambulatory Visit: Payer: Self-pay | Admitting: *Deleted

## 2011-02-12 MED ORDER — FOLIC ACID 1 MG PO TABS: 1.0000 mg | ORAL_TABLET | Freq: Two times a day (BID) | ORAL | Status: DC

## 2011-02-14 ENCOUNTER — Encounter (HOSPITAL_COMMUNITY): Payer: Self-pay

## 2011-02-15 ENCOUNTER — Encounter (HOSPITAL_COMMUNITY): Payer: Self-pay

## 2011-02-19 ENCOUNTER — Encounter (HOSPITAL_COMMUNITY)
Admission: RE | Admit: 2011-02-19 | Discharge: 2011-02-19 | Disposition: A | Discharge status: Home / Self Care | Payer: Self-pay | Source: Ambulatory Visit | Attending: Cardiology | Admitting: Cardiology

## 2011-02-21 ENCOUNTER — Encounter (HOSPITAL_COMMUNITY)
Admission: RE | Admit: 2011-02-21 | Discharge: 2011-02-21 | Disposition: A | Discharge status: Home / Self Care | Payer: Self-pay | Source: Ambulatory Visit | Attending: Cardiology | Admitting: Cardiology

## 2011-02-22 ENCOUNTER — Encounter (HOSPITAL_COMMUNITY): Payer: Self-pay

## 2011-02-26 ENCOUNTER — Encounter (HOSPITAL_COMMUNITY)
Admission: RE | Admit: 2011-02-26 | Discharge: 2011-02-26 | Disposition: A | Discharge status: Home / Self Care | Payer: Self-pay | Source: Ambulatory Visit | Attending: Cardiology | Admitting: Cardiology

## 2011-02-28 ENCOUNTER — Encounter (HOSPITAL_COMMUNITY)
Admission: RE | Admit: 2011-02-28 | Discharge: 2011-02-28 | Disposition: A | Discharge status: Home / Self Care | Payer: Self-pay | Source: Ambulatory Visit | Attending: Cardiology | Admitting: Cardiology

## 2011-03-01 ENCOUNTER — Encounter (HOSPITAL_COMMUNITY): Payer: Medicare Other

## 2011-03-05 ENCOUNTER — Encounter (HOSPITAL_COMMUNITY): Payer: Medicare Other

## 2011-03-07 ENCOUNTER — Encounter (HOSPITAL_COMMUNITY): Payer: Medicare Other

## 2011-03-08 ENCOUNTER — Other Ambulatory Visit: Payer: Self-pay | Admitting: *Deleted

## 2011-03-08 ENCOUNTER — Encounter (HOSPITAL_COMMUNITY): Payer: Medicare Other

## 2011-03-11 ENCOUNTER — Other Ambulatory Visit: Payer: Self-pay | Admitting: *Deleted

## 2011-03-11 MED ORDER — FOLIC ACID 1 MG PO TABS: 1.0000 mg | ORAL_TABLET | Freq: Two times a day (BID) | ORAL | Status: DC

## 2011-03-11 NOTE — Telephone Encounter (Signed)
Refilled folic acid

## 2011-03-12 ENCOUNTER — Encounter (HOSPITAL_COMMUNITY): Payer: Medicare Other

## 2011-03-14 ENCOUNTER — Encounter (HOSPITAL_COMMUNITY): Payer: Medicare Other

## 2011-03-15 ENCOUNTER — Encounter (HOSPITAL_COMMUNITY): Payer: Medicare Other

## 2011-03-19 ENCOUNTER — Encounter (HOSPITAL_COMMUNITY): Payer: Medicare Other

## 2011-03-21 ENCOUNTER — Encounter (HOSPITAL_COMMUNITY): Payer: Medicare Other

## 2011-03-22 ENCOUNTER — Encounter (HOSPITAL_COMMUNITY): Payer: Medicare Other

## 2011-03-26 ENCOUNTER — Encounter (HOSPITAL_COMMUNITY): Payer: Medicare Other

## 2011-03-28 ENCOUNTER — Encounter (HOSPITAL_COMMUNITY): Payer: Medicare Other

## 2011-03-29 ENCOUNTER — Encounter (HOSPITAL_COMMUNITY): Payer: Medicare Other

## 2011-04-02 ENCOUNTER — Encounter (HOSPITAL_COMMUNITY): Payer: Medicare Other

## 2011-04-04 ENCOUNTER — Encounter (HOSPITAL_COMMUNITY): Payer: Medicare Other

## 2011-04-05 ENCOUNTER — Encounter (HOSPITAL_COMMUNITY): Payer: Medicare Other

## 2011-04-09 ENCOUNTER — Encounter (HOSPITAL_COMMUNITY): Payer: Medicare Other

## 2011-04-11 ENCOUNTER — Encounter (HOSPITAL_COMMUNITY): Payer: Medicare Other

## 2011-04-12 ENCOUNTER — Encounter (HOSPITAL_COMMUNITY): Payer: Medicare Other

## 2011-04-15 ENCOUNTER — Other Ambulatory Visit: Payer: Self-pay

## 2011-04-15 MED ORDER — CLOPIDOGREL BISULFATE 75 MG PO TABS: 75.0000 mg | ORAL_TABLET | Freq: Every day | ORAL | Status: DC

## 2011-04-15 MED ORDER — ATORVASTATIN CALCIUM 40 MG PO TABS: 40.0000 mg | ORAL_TABLET | Freq: Every day | ORAL | Status: DC

## 2011-04-16 ENCOUNTER — Encounter (HOSPITAL_COMMUNITY): Payer: Medicare Other

## 2011-04-18 ENCOUNTER — Encounter (HOSPITAL_COMMUNITY): Payer: Medicare Other

## 2011-04-19 ENCOUNTER — Encounter (HOSPITAL_COMMUNITY): Payer: Medicare Other

## 2011-04-23 ENCOUNTER — Encounter (HOSPITAL_COMMUNITY): Payer: Medicare Other

## 2011-04-25 ENCOUNTER — Encounter (HOSPITAL_COMMUNITY): Payer: Medicare Other

## 2011-04-26 ENCOUNTER — Encounter (HOSPITAL_COMMUNITY): Payer: Medicare Other

## 2011-04-28 ENCOUNTER — Other Ambulatory Visit: Payer: Self-pay

## 2011-04-28 ENCOUNTER — Emergency Department (HOSPITAL_BASED_OUTPATIENT_CLINIC_OR_DEPARTMENT_OTHER)
Admission: EM | Admit: 2011-04-28 | Discharge: 2011-04-28 | Disposition: A | Discharge status: Home / Self Care | Payer: Medicare Other | Attending: Emergency Medicine | Admitting: Emergency Medicine

## 2011-04-28 ENCOUNTER — Emergency Department (INDEPENDENT_AMBULATORY_CARE_PROVIDER_SITE_OTHER): Payer: Medicare Other

## 2011-04-28 ENCOUNTER — Encounter (HOSPITAL_BASED_OUTPATIENT_CLINIC_OR_DEPARTMENT_OTHER): Payer: Self-pay | Admitting: Emergency Medicine

## 2011-04-28 DIAGNOSIS — R109 Unspecified abdominal pain: Secondary | ICD-10-CM | POA: Insufficient documentation

## 2011-04-28 DIAGNOSIS — M329 Systemic lupus erythematosus, unspecified: Secondary | ICD-10-CM | POA: Insufficient documentation

## 2011-04-28 DIAGNOSIS — K219 Gastro-esophageal reflux disease without esophagitis: Secondary | ICD-10-CM | POA: Insufficient documentation

## 2011-04-28 DIAGNOSIS — Z79899 Other long term (current) drug therapy: Secondary | ICD-10-CM | POA: Insufficient documentation

## 2011-04-28 DIAGNOSIS — I2510 Atherosclerotic heart disease of native coronary artery without angina pectoris: Secondary | ICD-10-CM | POA: Insufficient documentation

## 2011-04-28 DIAGNOSIS — E7800 Pure hypercholesterolemia, unspecified: Secondary | ICD-10-CM | POA: Insufficient documentation

## 2011-04-28 DIAGNOSIS — R7401 Elevation of levels of liver transaminase levels: Secondary | ICD-10-CM | POA: Insufficient documentation

## 2011-04-28 DIAGNOSIS — R7402 Elevation of levels of lactic acid dehydrogenase (LDH): Secondary | ICD-10-CM | POA: Insufficient documentation

## 2011-04-28 LAB — COMPREHENSIVE METABOLIC PANEL
Albumin: 3.5 g/dL (ref 3.5–5.2)
BUN: 21 mg/dL (ref 6–23)
Calcium: 9.8 mg/dL (ref 8.4–10.5)
Creatinine, Ser: 0.7 mg/dL (ref 0.50–1.10)
GFR calc Af Amer: >90 mL/min (ref >90)
Glucose, Bld: 119 mg/dL — ABNORMAL HIGH (ref 70–99)
Potassium: 3.9 mEq/L (ref 3.5–5.1)
Total Protein: 6.3 g/dL (ref 6.0–8.3)

## 2011-04-28 LAB — CBC
HCT: 40.6 % (ref 36.0–46.0)
Hemoglobin: 14.1 g/dL (ref 12.0–15.0)
MCH: 32.2 pg (ref 26.0–34.0)
MCHC: 34.7 g/dL (ref 30.0–36.0)
MCV: 92.7 fL (ref 78.0–100.0)
RDW: 12.5 % (ref 11.5–15.5)

## 2011-04-28 LAB — URINALYSIS, ROUTINE W REFLEX MICROSCOPIC
Ketones, ur: NEGATIVE mg/dL
Leukocytes, UA: NEGATIVE
Protein, ur: NEGATIVE mg/dL
Urobilinogen, UA: 0.2 mg/dL (ref 0.0–1.0)

## 2011-04-28 LAB — LIPASE, BLOOD: Lipase: 51 U/L (ref 11–59)

## 2011-04-28 LAB — TROPONIN I
Troponin I: <0.3 ng/mL (ref <0.30)
Troponin I: <0.3 ng/mL (ref <0.30)

## 2011-04-28 NOTE — Discharge Instructions (Signed)

## 2011-04-28 NOTE — ED Provider Notes (Signed)
History     CSN: [id number]  Arrival date & time 04/28/11  4098   First MD Initiated Contact with Patient 04/28/11 0935      Chief Complaint  Patient presents with  . Abdominal Pain    (Consider location/radiation/quality/duration/timing/severity/associated sxs/prior treatment) HPI  80oF h/o CAD sp stent x2, HLD pw epigastric pain. She states at approximately 8 AM this morning she experienced cramping upper abdominal pain which is diffuse. She states it is crampy and constant dull aching. She had mild nausea without vomiting. There was no diaphoresis. Or radiation. Denies back pain. No cp/sob/palpitations. Denies constipation, diarrhea. Nl BM this morning. Tolerating PO without difficulty.   ED Notes, ED Provider Notes from 04/28/11 0000 to 04/28/11 09:39:54       Estelle June, RN 04/28/2011 09:36      Pt to ED w c/o BUQ pain, "not sharp", intermittent. Some mild nausea, was able to tolerate her usual breakfast.     Past Medical History  Diagnosis Date  . CAD (coronary artery disease)   . Hypercholesteremia   . Lupus   . GERD (gastroesophageal reflux disease)   . Spinal stenosis   . Fibula fracture     right    Past Surgical History  Procedure Date  . Abdominal hysterectomy   . Breast lumpectomy   . Coronary angioplasty     Family History  Problem Relation Age of Onset  . Cancer Father   . Heart attack Mother   . Heart attack Brother   . Cancer Sister   . Cancer Sister     History  Substance Use Topics  . Smoking status: Never Smoker   . Smokeless tobacco: Not on file  . Alcohol Use: No    OB History    Grav Para Term Preterm Abortions TAB SAB Ect Mult Living                  Review of Systems  All other systems reviewed and are negative.   except as noted HPI   Allergies  Keflex; Penicillins; and Sulfa drugs cross reactors  Home Medications   Current Outpatient Rx  Name Route Sig Dispense Refill  . ASPIRIN 81 MG PO TABS Oral Take  81 mg by mouth daily.      . ATORVASTATIN CALCIUM 40 MG PO TABS Oral Take 1 tablet (40 mg total) by mouth daily. 30 tablet 1  . CALCITONIN (SALMON) 200 UNIT/ACT NA SOLN Nasal 1 spray by Nasal route daily.      Marland Kitchen CALCIUM CARBONATE 200 MG PO CAPS Oral Take 600 mg by mouth 2 (two) times daily.     Marland Kitchen VITAMIN D 1000 UNITS PO CAPS Oral Take 1,000 Units by mouth daily.      Marland Kitchen CLOPIDOGREL BISULFATE 75 MG PO TABS Oral Take 1 tablet (75 mg total) by mouth daily. 30 tablet 1  . FOLIC ACID 1 MG PO TABS Oral Take 1 tablet (1 mg total) by mouth 2 (two) times daily. 60 tablet 5  . HYDROXYCHLOROQUINE SULFATE 200 MG PO TABS Oral Take by mouth daily.      Carma Leaven VITAL M PO TABS Oral Take 1 tablet by mouth daily. 30 tablet   . NASONEX 50 MCG/ACT NA SUSP Nasal 2 sprays by Nasal route Daily.    Marland Kitchen NITROGLYCERIN 0.4 MG SL SUBL Sublingual Place 1 tablet (0.4 mg total) under the tongue every 5 (five) minutes as needed. 25 tablet 3    BP 161/73  Pulse 69  Temp(Src) 97.8 F (36.6 C) (Oral)  Resp 16  SpO2 100%  Physical Exam  Nursing note and vitals reviewed. Constitutional: She is oriented to person, place, and time. She appears well-developed.  HENT:  Head: Atraumatic.  Mouth/Throat: Oropharynx is clear and moist.  Eyes: Conjunctivae and EOM are normal. Pupils are equal, round, and reactive to light.  Neck: Normal range of motion. Neck supple.  Cardiovascular: Normal rate, regular rhythm, normal heart sounds and intact distal pulses.   Pulmonary/Chest: Effort normal and breath sounds normal. No respiratory distress. She has no wheezes. She has no rales. She exhibits no tenderness.  Abdominal: Soft. She exhibits no distension. There is no tenderness. There is no rebound and no guarding.  Musculoskeletal: Normal range of motion. She exhibits no edema and no tenderness.  Neurological: She is alert and oriented to person, place, and time.  Skin: Skin is warm and dry. No rash noted.  Psychiatric: She has a  normal mood and affect.    Date: 04/28/2011  Rate: 66  Rhythm: normal sinus rhythm  QRS Axis: normal  Intervals: normal  ST/T Wave abnormalities: normal  Conduction Disutrbances:none  Narrative Interpretation:   Old EKG Reviewed: unchanged  ED Course  Procedures (including critical care time)  Labs Reviewed  CBC - Abnormal; Notable for the following:    Platelets 149 (*)    All other components within normal limits  COMPREHENSIVE METABOLIC PANEL - Abnormal; Notable for the following:    Glucose, Bld 119 (*)    AST 199 (*)    ALT 115 (*)    GFR calc non Af Amer 81 (*)    All other components within normal limits  LIPASE, BLOOD  TROPONIN I  TROPONIN I  URINALYSIS, ROUTINE W REFLEX MICROSCOPIC   Dg Chest 2 View  04/28/2011  *RADIOLOGY REPORT*  Clinical Data: Abdominal pain  CHEST - 2 VIEW  Comparison: 01/28/2011  Findings: Heart size is normal.  No pleural effusion or edema.  No airspace consolidation identified.  There are increased lung volumes and coarsened interstitial markings compatible with COPD.  There is mild spondylosis within the thoracic spine.  IMPRESSION:  1. 1.  Chronic interstitial change. 2.  No acute findings.  Original Report Authenticated By: Rosealee Albee, M.D.     1. Abdominal pain   2. Transaminitis       MDM  The patient experienced transient upper abdominal pain. In the emergency department at the time of presentation and prior to discharge she is not complaining of abdominal pain nor does she have tenderness to palpation. She had 2 sets of troponins which were negative. Her EKG is unremarkable. Chest x-ray is unremarkable. She has a mild transaminitis of unclear etiology. She has good primary care followup and will followup within the week to have those rechecked. I've also ordered a right upper quadrant ultrasound to be completed as an outpatient and she does not have pain at this time. I do not suspect acute cholecystitis or obstruction, other  emergent need for ultrasound. She will followup tomorrow morning as discussed. She's been given strict precautions for return.        Forbes Cellar, MD 04/28/11 1358

## 2011-04-28 NOTE — ED Notes (Signed)
Pt to ED w c/o BUQ pain, "not sharp", intermittent.  Some mild nausea, was able to tolerate her usual breakfast.

## 2011-04-29 ENCOUNTER — Ambulatory Visit (HOSPITAL_BASED_OUTPATIENT_CLINIC_OR_DEPARTMENT_OTHER)
Admission: RE | Admit: 2011-04-29 | Discharge: 2011-04-29 | Disposition: A | Discharge status: Home / Self Care | Payer: Medicare Other | Source: Ambulatory Visit | Attending: Emergency Medicine | Admitting: Emergency Medicine

## 2011-04-29 DIAGNOSIS — I77811 Abdominal aortic ectasia: Secondary | ICD-10-CM

## 2011-04-29 DIAGNOSIS — R109 Unspecified abdominal pain: Secondary | ICD-10-CM

## 2011-04-29 DIAGNOSIS — Q619 Cystic kidney disease, unspecified: Secondary | ICD-10-CM | POA: Insufficient documentation

## 2011-04-29 DIAGNOSIS — N281 Cyst of kidney, acquired: Secondary | ICD-10-CM

## 2011-04-29 DIAGNOSIS — K8020 Calculus of gallbladder without cholecystitis without obstruction: Secondary | ICD-10-CM

## 2011-04-30 ENCOUNTER — Encounter (HOSPITAL_COMMUNITY): Payer: Medicare Other

## 2011-04-30 ENCOUNTER — Other Ambulatory Visit: Payer: Self-pay | Admitting: *Deleted

## 2011-04-30 MED ORDER — ATORVASTATIN CALCIUM 40 MG PO TABS: 40.0000 mg | ORAL_TABLET | Freq: Every day | ORAL | Status: DC

## 2011-04-30 MED ORDER — CLOPIDOGREL BISULFATE 75 MG PO TABS: 75.0000 mg | ORAL_TABLET | Freq: Every day | ORAL | Status: DC

## 2011-05-02 ENCOUNTER — Encounter (HOSPITAL_COMMUNITY): Payer: Medicare Other

## 2011-05-03 ENCOUNTER — Encounter (HOSPITAL_COMMUNITY): Payer: Medicare Other

## 2011-05-06 ENCOUNTER — Other Ambulatory Visit: Payer: Self-pay

## 2011-05-07 ENCOUNTER — Encounter (HOSPITAL_COMMUNITY): Payer: Medicare Other

## 2011-05-07 ENCOUNTER — Other Ambulatory Visit: Payer: Self-pay | Admitting: *Deleted

## 2011-05-08 ENCOUNTER — Other Ambulatory Visit: Payer: Self-pay | Admitting: Cardiology

## 2011-05-08 MED ORDER — ATORVASTATIN CALCIUM 40 MG PO TABS: 40.0000 mg | ORAL_TABLET | Freq: Every day | ORAL | Status: DC

## 2011-05-09 ENCOUNTER — Encounter (HOSPITAL_COMMUNITY): Payer: Medicare Other

## 2011-05-10 ENCOUNTER — Encounter (HOSPITAL_COMMUNITY): Payer: Medicare Other

## 2011-05-14 ENCOUNTER — Ambulatory Visit (INDEPENDENT_AMBULATORY_CARE_PROVIDER_SITE_OTHER): Payer: Medicare Other | Admitting: Cardiology

## 2011-05-14 ENCOUNTER — Encounter (HOSPITAL_COMMUNITY): Payer: Medicare Other

## 2011-05-14 ENCOUNTER — Encounter: Payer: Self-pay | Admitting: Cardiology

## 2011-05-14 VITALS — BP 138/74 | HR 80 | Ht 66.0 in | Wt 185.0 lb

## 2011-05-14 DIAGNOSIS — E785 Hyperlipidemia, unspecified: Secondary | ICD-10-CM

## 2011-05-14 DIAGNOSIS — K8020 Calculus of gallbladder without cholecystitis without obstruction: Secondary | ICD-10-CM

## 2011-05-14 DIAGNOSIS — I2510 Atherosclerotic heart disease of native coronary artery without angina pectoris: Secondary | ICD-10-CM

## 2011-05-14 NOTE — Patient Instructions (Signed)
Continue your current therapy  If surgery is required I would recommend holding plavix for 1 week but continuing on ASA.  I will see you again in 6 months with fasting lab.

## 2011-05-14 NOTE — Assessment & Plan Note (Signed)
She continues you very well from a cardiac standpoint. We will continue with her aspirin and statin therapy. She is on no antianginal therapy at this point. If she does require surgery for her gallbladder and I would recommend she stop her Plavix for one week prior but continue aspirin through her surgery. I will followup again 6 months.

## 2011-05-14 NOTE — Progress Notes (Signed)
HPI Pamela Gonzales is seen for followup today. She reports that she is doing very well from a cardiac standpoint. She continues her exercise program at [facility]. Last month she was seen in emergency room for abdominal pain. She was diagnosed with gallstones. Her ECG and cardiac enzymes and chest x-ray were unremarkable. Her blood pressure readings have been doing very well. She has been off of Plaquenil since October.  Allergies  Allergen Reactions  . Keflex   . Penicillins   . Sulfa Drugs Cross Reactors     Current Outpatient Prescriptions on File Prior to Visit  Medication Sig Dispense Refill  . aspirin 81 MG tablet Take 81 mg by mouth daily.        Marland Kitchen atorvastatin (LIPITOR) 40 MG tablet Take 1 tablet (40 mg total) by mouth daily.  30 tablet  5  . calcitonin, salmon, (MIACALCIN) 200 UNIT/ACT nasal spray 1 spray by Nasal route daily.        . calcium carbonate 200 MG capsule Take 600 mg by mouth 2 (two) times daily.       . Cholecalciferol (VITAMIN D) 1000 UNITS capsule Take 1,000 Units by mouth daily.        . clopidogrel (PLAVIX) 75 MG tablet Take 1 tablet (75 mg total) by mouth daily.  30 tablet  4  . folic acid (FOLVITE) 1 MG tablet Take 1 tablet (1 mg total) by mouth 2 (two) times daily.  60 tablet  5  . Multiple Vitamins-Minerals (MULTIVITAMIN) tablet Take 1 tablet by mouth daily.  30 tablet    . NASONEX 50 MCG/ACT nasal spray 2 sprays by Nasal route Daily.      . nitroGLYCERIN (NITROSTAT) 0.4 MG SL tablet Place 1 tablet (0.4 mg total) under the tongue every 5 (five) minutes as needed.  25 tablet  3    Past Medical History  Diagnosis Date  . CAD (coronary artery disease)   . Hypercholesteremia   . Lupus   . GERD (gastroesophageal reflux disease)   . Spinal stenosis   . Fibula fracture     right  . Gallstones     Past Surgical History  Procedure Date  . Abdominal hysterectomy   . Breast lumpectomy   . Coronary angioplasty     Family History  Problem Relation Age of Onset  .  Cancer Father   . Heart attack Mother   . Heart attack Brother   . Cancer Sister   . Cancer Sister     History   Social History  . Marital Status: Divorced    Spouse Name: N/A    Number of Children: 2  . Years of Education: N/A   Occupational History  . insurance sales     retired   Social History Main Topics  . Smoking status: Never Smoker   . Smokeless tobacco: Not on file  . Alcohol Use: No  . Drug Use: No  . Sexually Active: Not on file   Other Topics Concern  . Not on file   Social History Narrative  . No narrative on file    ROS  as noted in history of present illness. All other systems were reviewed and are negative.  PHYSICAL EXAM BP 138/74  Pulse 80  Ht 5\' 6"  (1.676 m)  Wt 185 lb (83.915 kg)  BMI 29.86 kg/m2 The patient is alert and oriented x 3.  The mood and affect are normal.  The skin is warm and dry.  Color is normal.  The HEENT exam reveals that the sclera are nonicteric.  The mucous membranes are moist.  The carotids are 2+ without bruits.  There is no thyromegaly.  There is no JVD.  The lungs are clear.  The chest wall is non tender.  The heart exam reveals a regular rate with a normal S1 and S2.  There are no murmurs, gallops, or rubs.  The PMI is not displaced.   Abdominal exam reveals good bowel sounds.  There is no guarding or rebound.  There is no hepatosplenomegaly or tenderness.  There are no masses.  Exam of the legs reveal no clubbing, cyanosis, or edema.  The legs are without rashes.  The distal pulses are intact.  Cranial nerves II - XII are intact.  Motor and sensory functions are intact.  The gait is normal.  Laboratory data: Laboratory data from the emergency department was reviewed.  ASSESSMENT AND PLAN

## 2011-05-15 ENCOUNTER — Telehealth (INDEPENDENT_AMBULATORY_CARE_PROVIDER_SITE_OTHER): Payer: Self-pay | Admitting: General Surgery

## 2011-05-15 NOTE — Telephone Encounter (Signed)
Pt has appt with you on 05-28-11 to eval for gallbladder

## 2011-05-15 NOTE — Progress Notes (Signed)
Looks like this person is cleared for gallbladder surgery but i can't find any notes from me on this person. Can you help me?

## 2011-05-16 ENCOUNTER — Encounter (HOSPITAL_COMMUNITY): Payer: Medicare Other

## 2011-05-17 ENCOUNTER — Encounter (HOSPITAL_COMMUNITY): Payer: Medicare Other

## 2011-05-21 ENCOUNTER — Telehealth: Payer: Self-pay | Admitting: Cardiology

## 2011-05-21 ENCOUNTER — Encounter (HOSPITAL_COMMUNITY): Payer: Medicare Other

## 2011-05-21 MED ORDER — FOLIC ACID 1 MG PO TABS: 1.0000 mg | ORAL_TABLET | Freq: Two times a day (BID) | ORAL | Status: DC

## 2011-05-21 NOTE — Telephone Encounter (Signed)
Patient was called back she stated she needed refill on folic acid 1mg  twice daily sent to [facility] [address].Stated she will pick up 05/30/11.

## 2011-05-21 NOTE — Telephone Encounter (Signed)
New problem:  Patient calling, patient did not want to disclose her concerns. States she has a lot to talk with nurse.

## 2011-05-23 ENCOUNTER — Encounter (HOSPITAL_COMMUNITY): Payer: Medicare Other

## 2011-05-24 ENCOUNTER — Encounter (HOSPITAL_COMMUNITY): Payer: Medicare Other

## 2011-05-28 ENCOUNTER — Encounter (INDEPENDENT_AMBULATORY_CARE_PROVIDER_SITE_OTHER): Payer: Self-pay | Admitting: General Surgery

## 2011-05-28 ENCOUNTER — Ambulatory Visit (INDEPENDENT_AMBULATORY_CARE_PROVIDER_SITE_OTHER): Payer: Medicare Other | Admitting: General Surgery

## 2011-05-28 ENCOUNTER — Encounter (HOSPITAL_COMMUNITY): Payer: Medicare Other

## 2011-05-28 DIAGNOSIS — K8020 Calculus of gallbladder without cholecystitis without obstruction: Secondary | ICD-10-CM | POA: Insufficient documentation

## 2011-05-28 NOTE — Progress Notes (Signed)
Subjective:     Patient ID: Pamela Gonzales, female   DOB: July 21, 1930, 78 y.o.   MRN: [id number]  HPI We are asked to see the patient in consultation by Dr. Tenny Craw to evaluate her for gallstones. The patient is a 78 year old white female who first had an episode of right upper quadrant pain on Easter Sunday. The pain lasted several hours before subsiding. The pain has been associated with nausea but no vomiting. Although the pain has gone she continues to have nausea after eating. She had an ultrasound performed that did show stones in the gallbladder but no gallbladder wall thickening or ductal dilatation. She did have some transient elevation of her liver functions as well.  Review of Systems  Constitutional: Negative.   HENT: Negative.   Eyes: Negative.   Respiratory: Negative.   Cardiovascular: Negative.   Gastrointestinal: Positive for nausea and abdominal pain.  Genitourinary: Negative.   Musculoskeletal: Negative.   Skin: Negative.   Neurological: Negative.   Hematological: Negative.   Psychiatric/Behavioral: Negative.        Objective:   Physical Exam  Constitutional: She is oriented to person, place, and time. She appears well-developed and well-nourished.  HENT:  Head: Normocephalic and atraumatic.  Eyes: Conjunctivae and EOM are normal. Pupils are equal, round, and reactive to light.  Neck: Normal range of motion. Neck supple.  Cardiovascular: Normal rate and normal heart sounds.        Slightly irregular rhythm  Pulmonary/Chest: Effort normal and breath sounds normal.  Abdominal: Soft. Bowel sounds are normal.       There is mild epigastric tenderness. No palpable mass.  Musculoskeletal: Normal range of motion.  Neurological: She is alert and oriented to person, place, and time.  Skin: Skin is warm and dry.  Psychiatric: She has a normal mood and affect. Her behavior is normal.       Assessment:     Symptomatic gallstones    Plan:     Because the risk of further  painful episodes and possible pancreatitis I think she would benefit from having her gallbladder removed. She would also like to have this done. She has already received cardiac clearance from Dr. [place]. She should stop her Plavix 5 days beforehand. Unfortunately she is not allowed to stop her aspirin which puts her at higher risk for having a bleeding complication. I have discussed her in detail the risks and benefits of the operation remove the gallbladder as well as some of the technical aspects and she understands and wishes to proceed

## 2011-05-30 ENCOUNTER — Encounter (HOSPITAL_COMMUNITY): Payer: Medicare Other

## 2011-05-31 ENCOUNTER — Encounter (HOSPITAL_COMMUNITY): Payer: Medicare Other

## 2011-06-04 ENCOUNTER — Encounter (HOSPITAL_COMMUNITY): Payer: Medicare Other

## 2011-06-06 ENCOUNTER — Encounter (HOSPITAL_COMMUNITY): Payer: Medicare Other

## 2011-06-07 ENCOUNTER — Encounter (HOSPITAL_COMMUNITY): Payer: Medicare Other

## 2011-06-11 ENCOUNTER — Encounter (HOSPITAL_COMMUNITY): Payer: Medicare Other

## 2011-06-13 ENCOUNTER — Encounter (HOSPITAL_COMMUNITY): Payer: Medicare Other

## 2011-06-14 ENCOUNTER — Encounter (HOSPITAL_COMMUNITY): Payer: Medicare Other

## 2011-06-18 ENCOUNTER — Encounter (HOSPITAL_COMMUNITY): Payer: Medicare Other

## 2011-06-20 ENCOUNTER — Encounter (HOSPITAL_COMMUNITY): Payer: Self-pay | Admitting: Pharmacy Technician

## 2011-06-20 ENCOUNTER — Encounter (HOSPITAL_COMMUNITY): Payer: Medicare Other

## 2011-06-21 ENCOUNTER — Encounter (HOSPITAL_COMMUNITY): Payer: Medicare Other

## 2011-06-25 ENCOUNTER — Encounter (HOSPITAL_COMMUNITY): Payer: Medicare Other

## 2011-06-27 ENCOUNTER — Encounter (HOSPITAL_COMMUNITY): Payer: Self-pay

## 2011-06-27 ENCOUNTER — Inpatient Hospital Stay (HOSPITAL_COMMUNITY): Admission: RE | Admit: 2011-06-27 | Discharge: 2011-06-27 | Payer: Medicare Other | Source: Ambulatory Visit

## 2011-06-27 ENCOUNTER — Other Ambulatory Visit (HOSPITAL_COMMUNITY): Payer: Medicare Other

## 2011-06-27 ENCOUNTER — Encounter (HOSPITAL_COMMUNITY): Payer: Medicare Other

## 2011-06-27 ENCOUNTER — Telehealth (INDEPENDENT_AMBULATORY_CARE_PROVIDER_SITE_OTHER): Payer: Self-pay | Admitting: General Surgery

## 2011-06-27 HISTORY — DX: Unspecified osteoarthritis, unspecified site: M19.90

## 2011-06-27 NOTE — Telephone Encounter (Signed)
LMOM for pt to let her know that I rescheduled her 1st PO apt for 7/11 at 10:00.

## 2011-06-28 ENCOUNTER — Encounter (HOSPITAL_COMMUNITY)
Admission: RE | Admit: 2011-06-28 | Discharge: 2011-06-28 | Disposition: A | Discharge status: Home / Self Care | Payer: Medicare Other | Source: Ambulatory Visit | Attending: General Surgery | Admitting: General Surgery

## 2011-06-28 ENCOUNTER — Encounter (HOSPITAL_COMMUNITY): Payer: Self-pay

## 2011-06-28 ENCOUNTER — Telehealth (INDEPENDENT_AMBULATORY_CARE_PROVIDER_SITE_OTHER): Payer: Self-pay | Admitting: General Surgery

## 2011-06-28 ENCOUNTER — Encounter (HOSPITAL_COMMUNITY): Payer: Self-pay | Admitting: Pharmacy Technician

## 2011-06-28 ENCOUNTER — Encounter (HOSPITAL_COMMUNITY): Payer: Medicare Other

## 2011-06-28 LAB — BASIC METABOLIC PANEL
BUN: 20 mg/dL (ref 6–23)
Creatinine, Ser: 0.81 mg/dL (ref 0.50–1.10)
GFR calc Af Amer: 79 mL/min — ABNORMAL LOW (ref >90)
GFR calc non Af Amer: 68 mL/min — ABNORMAL LOW (ref >90)
Potassium: 4.3 mEq/L (ref 3.5–5.1)

## 2011-06-28 LAB — CBC
HCT: 41.5 % (ref 36.0–46.0)
Hemoglobin: 14.3 g/dL (ref 12.0–15.0)
MCH: 32.9 pg (ref 26.0–34.0)
MCHC: 34.5 g/dL (ref 30.0–36.0)
MCV: 95.6 fL (ref 78.0–100.0)
Platelets: 166 10*3/uL (ref 150–400)
RBC: 4.34 MIL/uL (ref 3.87–5.11)
RDW: 13 % (ref 11.5–15.5)
WBC: 6.2 10*3/uL (ref 4.0–10.5)

## 2011-06-28 LAB — SURGICAL PCR SCREEN: MRSA, PCR: NEGATIVE

## 2011-06-28 MED ORDER — CHLORHEXIDINE GLUCONATE 4 % EX LIQD: 1.0000 "application " | Freq: Once | CUTANEOUS | Status: DC

## 2011-06-28 NOTE — Telephone Encounter (Signed)
After talking with pt she wanted to know if she could go home the day of surgery instead of staying overnight like she had originally wanted.  After talking with Dr. Carolynne Edouard, he said that as long as everything is looking good on the cardiovascular side after surgery then she should be able to return home that day with her son.

## 2011-06-28 NOTE — Telephone Encounter (Signed)
Message copied by Littie Deeds on Fri Jun 28, 2011  2:30 PM ------      Message from: Littie Deeds      Created: Thu Jun 27, 2011 10:14 AM      Regarding: surgery on 07/04/11       Does she needs to stay overnight due to her cardiovascular problems or can she go home after her GB surgery.  Her son will be there to take care of her.

## 2011-06-28 NOTE — Progress Notes (Signed)
Notes from dr [place]  4/13, stress 4/12, ekg , cxr 3/13 in epic

## 2011-06-28 NOTE — Pre-Procedure Instructions (Addendum)
20 NICLOE FRONTERA  06/28/2011   Your procedure is scheduled on:  07/04/11  Report to [facility] at 530 AM.  Call this number if you have problems the morning of surgery: [phone]   Remember:   Do not eat food:After Midnight.  May have clear liquids: up to 4 Hours before arrival 130 am.  Clear liquids include soda, tea, black coffee, apple or grape juice, broth.  Take these medicines the morning of surgery with A SIP OF WATER: nasonex, nitro if needed* STOP, plavix, multi vit now   Do not wear jewelry, make-up or nail polish.  Do not wear lotions, powders, or perfumes. You may wear deodorant.  Do not shave 48 hours prior to surgery. Men may shave face and neck.  Do not bring valuables to the hospital.  Contacts, dentures or bridgework may not be worn into surgery.  Leave suitcase in the car. After surgery it may be brought to your room.  For patients admitted to the hospital, checkout time is 11:00 AM the day of discharge.   Patients discharged the day of surgery will not be allowed to drive home.  Name and phone number of your driver: eric Reichow  [phone] cell  Special Instructions: CHG Shower Use Special Wash: 1/2 bottle night before surgery and 1/2 bottle morning of surgery.   Please read over the following fact sheets that you were given: Pain Booklet, Coughing and Deep Breathing, MRSA Information and Surgical Site Infection Prevention

## 2011-07-02 ENCOUNTER — Encounter (HOSPITAL_COMMUNITY): Payer: Medicare Other

## 2011-07-03 MED ORDER — CIPROFLOXACIN IN D5W 400 MG/200ML IV SOLN
400.0000 mg | INTRAVENOUS | Status: AC
  Administered 2011-07-04: 400 mg via INTRAVENOUS
  Filled 2011-07-03: qty 200

## 2011-07-04 ENCOUNTER — Encounter (HOSPITAL_COMMUNITY): Payer: Medicare Other

## 2011-07-04 ENCOUNTER — Ambulatory Visit (HOSPITAL_COMMUNITY): Admission: RE | Admit: 2011-07-04 | Payer: Medicare Other | Source: Ambulatory Visit | Admitting: General Surgery

## 2011-07-04 ENCOUNTER — Encounter (HOSPITAL_COMMUNITY): Payer: Self-pay | Admitting: Anesthesiology

## 2011-07-04 ENCOUNTER — Encounter (HOSPITAL_COMMUNITY): Admission: RE | Payer: Self-pay | Source: Ambulatory Visit

## 2011-07-04 ENCOUNTER — Ambulatory Visit (HOSPITAL_COMMUNITY): Payer: Medicare Other

## 2011-07-04 ENCOUNTER — Encounter (HOSPITAL_COMMUNITY): Payer: Self-pay | Admitting: *Deleted

## 2011-07-04 ENCOUNTER — Ambulatory Visit (HOSPITAL_COMMUNITY)
Admission: RE | Admit: 2011-07-04 | Discharge: 2011-07-04 | Disposition: A | Discharge status: Home / Self Care | Payer: Medicare Other | Source: Ambulatory Visit | Attending: General Surgery | Admitting: General Surgery

## 2011-07-04 ENCOUNTER — Ambulatory Visit (HOSPITAL_COMMUNITY): Payer: Medicare Other | Admitting: Anesthesiology

## 2011-07-04 ENCOUNTER — Encounter (HOSPITAL_COMMUNITY)
Admission: RE | Disposition: A | Discharge status: Home / Self Care | Payer: Self-pay | Source: Ambulatory Visit | Attending: General Surgery

## 2011-07-04 DIAGNOSIS — K8010 Calculus of gallbladder with chronic cholecystitis without obstruction: Secondary | ICD-10-CM

## 2011-07-04 DIAGNOSIS — K8020 Calculus of gallbladder without cholecystitis without obstruction: Secondary | ICD-10-CM

## 2011-07-04 DIAGNOSIS — Z01812 Encounter for preprocedural laboratory examination: Secondary | ICD-10-CM | POA: Insufficient documentation

## 2011-07-04 DIAGNOSIS — K219 Gastro-esophageal reflux disease without esophagitis: Secondary | ICD-10-CM | POA: Insufficient documentation

## 2011-07-04 DIAGNOSIS — I2510 Atherosclerotic heart disease of native coronary artery without angina pectoris: Secondary | ICD-10-CM | POA: Insufficient documentation

## 2011-07-04 HISTORY — PX: CHOLECYSTECTOMY: SHX55

## 2011-07-04 SURGERY — LAPAROSCOPIC CHOLECYSTECTOMY WITH INTRAOPERATIVE CHOLANGIOGRAM
Anesthesia: General

## 2011-07-04 SURGERY — LAPAROSCOPIC CHOLECYSTECTOMY WITH INTRAOPERATIVE CHOLANGIOGRAM
Anesthesia: General | Site: Abdomen | Wound class: Clean Contaminated

## 2011-07-04 MED ORDER — DROPERIDOL 2.5 MG/ML IJ SOLN: 0.6250 mg | INTRAMUSCULAR | Status: DC | PRN

## 2011-07-04 MED ORDER — MIDAZOLAM HCL 5 MG/5ML IJ SOLN
INTRAMUSCULAR | Status: DC | PRN
  Administered 2011-07-04 (×2): 1 mg via INTRAVENOUS

## 2011-07-04 MED ORDER — PROPOFOL 10 MG/ML IV EMUL
INTRAVENOUS | Status: DC | PRN
  Administered 2011-07-04: 20 mg via INTRAVENOUS
  Administered 2011-07-04: 150 mg via INTRAVENOUS

## 2011-07-04 MED ORDER — ACETAMINOPHEN 10 MG/ML IV SOLN
INTRAVENOUS | Status: AC
  Filled 2011-07-04: qty 100

## 2011-07-04 MED ORDER — EPHEDRINE SULFATE 50 MG/ML IJ SOLN
INTRAMUSCULAR | Status: DC | PRN
  Administered 2011-07-04: 5 mg via INTRAVENOUS

## 2011-07-04 MED ORDER — LIDOCAINE HCL (CARDIAC) 20 MG/ML IV SOLN
INTRAVENOUS | Status: DC | PRN
  Administered 2011-07-04: 100 mg via INTRAVENOUS

## 2011-07-04 MED ORDER — SODIUM CHLORIDE 0.9 % IR SOLN
Status: DC | PRN
  Administered 2011-07-04: 1000 mL

## 2011-07-04 MED ORDER — LACTATED RINGERS IV SOLN
INTRAVENOUS | Status: DC | PRN
  Administered 2011-07-04: 07:00:00 via INTRAVENOUS

## 2011-07-04 MED ORDER — ONDANSETRON HCL 4 MG/2ML IJ SOLN
INTRAMUSCULAR | Status: DC | PRN
  Administered 2011-07-04: 4 mg via INTRAVENOUS

## 2011-07-04 MED ORDER — DEXTROSE 5 % IV SOLN
INTRAVENOUS | Status: DC | PRN
  Administered 2011-07-04: 07:00:00 via INTRAVENOUS

## 2011-07-04 MED ORDER — BUPIVACAINE-EPINEPHRINE 0.25% -1:200000 IJ SOLN
INTRAMUSCULAR | Status: DC | PRN
  Administered 2011-07-04: 20 mL

## 2011-07-04 MED ORDER — NEOSTIGMINE METHYLSULFATE 1 MG/ML IJ SOLN
INTRAMUSCULAR | Status: DC | PRN
  Administered 2011-07-04: 4 mg via INTRAVENOUS

## 2011-07-04 MED ORDER — FENTANYL CITRATE 0.05 MG/ML IJ SOLN
INTRAMUSCULAR | Status: DC | PRN
  Administered 2011-07-04 (×2): 50 ug via INTRAVENOUS
  Administered 2011-07-04 (×2): 100 ug via INTRAVENOUS

## 2011-07-04 MED ORDER — DEXAMETHASONE SODIUM PHOSPHATE 4 MG/ML IJ SOLN
INTRAMUSCULAR | Status: DC | PRN
  Administered 2011-07-04: 4 mg via INTRAVENOUS

## 2011-07-04 MED ORDER — SODIUM CHLORIDE 0.9 % IV SOLN
INTRAVENOUS | Status: DC | PRN
  Administered 2011-07-04: 08:00:00

## 2011-07-04 MED ORDER — GLYCOPYRROLATE 0.2 MG/ML IJ SOLN
INTRAMUSCULAR | Status: DC | PRN
  Administered 2011-07-04: .7 mg via INTRAVENOUS

## 2011-07-04 MED ORDER — ACETAMINOPHEN 10 MG/ML IV SOLN
INTRAVENOUS | Status: DC | PRN
  Administered 2011-07-04: 1000 mg via INTRAVENOUS

## 2011-07-04 MED ORDER — ROCURONIUM BROMIDE 100 MG/10ML IV SOLN
INTRAVENOUS | Status: DC | PRN
  Administered 2011-07-04: 40 mg via INTRAVENOUS

## 2011-07-04 MED ORDER — HYDROMORPHONE HCL PF 1 MG/ML IJ SOLN: 0.2500 mg | INTRAMUSCULAR | Status: DC | PRN

## 2011-07-04 MED ORDER — HYDROCODONE-ACETAMINOPHEN 5-325 MG PO TABS: 1.0000 | ORAL_TABLET | Freq: Four times a day (QID) | ORAL | Status: AC | PRN

## 2011-07-04 MED ORDER — LABETALOL HCL 5 MG/ML IV SOLN
INTRAVENOUS | Status: DC | PRN
  Administered 2011-07-04: 5 mg via INTRAVENOUS

## 2011-07-04 SURGICAL SUPPLY — 43 items
APPLIER CLIP 5 13 M/L LIGAMAX5 (MISCELLANEOUS) ×2
APPLIER CLIP ROT 10 11.4 M/L (STAPLE)
BLADE SURG ROTATE 9660 (MISCELLANEOUS) IMPLANT
CANISTER SUCTION 2500CC (MISCELLANEOUS) ×2 IMPLANT
CATH REDDICK CHOLANGI 4FR 50CM (CATHETERS) ×2 IMPLANT
CHLORAPREP W/TINT 26ML (MISCELLANEOUS) ×2 IMPLANT
CLIP APPLIE 5 13 M/L LIGAMAX5 (MISCELLANEOUS) ×1 IMPLANT
CLIP APPLIE ROT 10 11.4 M/L (STAPLE) IMPLANT
CLOTH BEACON ORANGE TIMEOUT ST (SAFETY) ×2 IMPLANT
COVER MAYO STAND STRL (DRAPES) ×2 IMPLANT
COVER SURGICAL LIGHT HANDLE (MISCELLANEOUS) ×2 IMPLANT
DECANTER SPIKE VIAL GLASS SM (MISCELLANEOUS) IMPLANT
DERMABOND ADVANCED (GAUZE/BANDAGES/DRESSINGS) ×1
DERMABOND ADVANCED .7 DNX12 (GAUZE/BANDAGES/DRESSINGS) ×1 IMPLANT
DRAPE C-ARM 42X72 X-RAY (DRAPES) ×2 IMPLANT
DRAPE UTILITY 15X26 W/TAPE STR (DRAPE) ×4 IMPLANT
ELECT REM PT RETURN 9FT ADLT (ELECTROSURGICAL) ×2
ELECTRODE REM PT RTRN 9FT ADLT (ELECTROSURGICAL) ×1 IMPLANT
GLOVE BIO SURGEON STRL SZ7 (GLOVE) ×2 IMPLANT
GLOVE BIO SURGEON STRL SZ7.5 (GLOVE) ×2 IMPLANT
GLOVE BIOGEL PI IND STRL 7.0 (GLOVE) ×2 IMPLANT
GLOVE BIOGEL PI INDICATOR 7.0 (GLOVE) ×2
GLOVE EUDERMIC 7 POWDERFREE (GLOVE) ×2 IMPLANT
GLOVE SURG SS PI 7.0 STRL IVOR (GLOVE) ×2 IMPLANT
GOWN STRL NON-REIN LRG LVL3 (GOWN DISPOSABLE) ×6 IMPLANT
GOWN STRL REIN XL XLG (GOWN DISPOSABLE) ×2 IMPLANT
IV CATH 14GX2 1/4 (CATHETERS) ×2 IMPLANT
KIT BASIN OR (CUSTOM PROCEDURE TRAY) ×2 IMPLANT
KIT ROOM TURNOVER OR (KITS) ×2 IMPLANT
NS IRRIG 1000ML POUR BTL (IV SOLUTION) ×2 IMPLANT
PAD ARMBOARD 7.5X6 YLW CONV (MISCELLANEOUS) ×2 IMPLANT
POUCH SPECIMEN RETRIEVAL 10MM (ENDOMECHANICALS) ×2 IMPLANT
SCISSORS LAP 5X35 DISP (ENDOMECHANICALS) IMPLANT
SET IRRIG TUBING LAPAROSCOPIC (IRRIGATION / IRRIGATOR) ×2 IMPLANT
SLEEVE ENDOPATH XCEL 5M (ENDOMECHANICALS) ×4 IMPLANT
SPECIMEN JAR SMALL (MISCELLANEOUS) ×2 IMPLANT
SUT MNCRL AB 4-0 PS2 18 (SUTURE) ×2 IMPLANT
TOWEL OR 17X24 6PK STRL BLUE (TOWEL DISPOSABLE) ×2 IMPLANT
TOWEL OR 17X26 10 PK STRL BLUE (TOWEL DISPOSABLE) ×2 IMPLANT
TRAY LAPAROSCOPIC (CUSTOM PROCEDURE TRAY) ×2 IMPLANT
TROCAR XCEL BLUNT TIP 100MML (ENDOMECHANICALS) ×2 IMPLANT
TROCAR XCEL NON-BLD 11X100MML (ENDOMECHANICALS) IMPLANT
TROCAR XCEL NON-BLD 5MMX100MML (ENDOMECHANICALS) ×2 IMPLANT

## 2011-07-04 NOTE — H&P (Signed)
Pamela Gonzales  Description:  78 year old female  05/28/2011 10:10 AM Office Visit Provider:  Robyne Askew, MD  MRN: [id number] Department:  [facility]            Diagnoses  Reason for Visit    Cholelithiasis - Primary  Other   574  new pt eval.gallbladder           Vitals - Last Recorded       BP  Pulse  Resp  Ht  Wt  BMI    148/84  80  16  5\' 6"  (1.676 m)  184 lb (83.462 kg)  29.70 kg/m2             Progress Notes     Robyne Askew, MD 05/28/2011 10:38 AM Signed    Subjective:    Patient ID: Pamela Gonzales, female DOB: April 17, 1930, 78 y.o. MRN: [id number]  HPI  We are asked to see the patient in consultation by Dr. Tenny Craw to evaluate her for gallstones. The patient is a 78 year old white female who first had an episode of right upper quadrant pain on Easter Sunday. The pain lasted several hours before subsiding. The pain has been associated with nausea but no vomiting. Although the pain has gone she continues to have nausea after eating. She had an ultrasound performed that did show stones in the gallbladder but no gallbladder wall thickening or ductal dilatation. She did have some transient elevation of her liver functions as well.  Review of Systems  Constitutional: Negative.  HENT: Negative.  Eyes: Negative.  Respiratory: Negative.  Cardiovascular: Negative.  Gastrointestinal: Positive for nausea and abdominal pain.  Genitourinary: Negative.  Musculoskeletal: Negative.  Skin: Negative.  Neurological: Negative.  Hematological: Negative.  Psychiatric/Behavioral: Negative.      Objective:    Physical Exam  Constitutional: She is oriented to person, place, and time. She appears well-developed and well-nourished.  HENT:  Head: Normocephalic and atraumatic.  Eyes: Conjunctivae and EOM are normal. Pupils are equal, round, and reactive to light.  Neck: Normal range of motion. Neck supple.  Cardiovascular: Normal rate and normal heart sounds.    Slightly irregular rhythm  Pulmonary/Chest: Effort normal and breath sounds normal.  Abdominal: Soft. Bowel sounds are normal.  There is mild epigastric tenderness. No palpable mass.  Musculoskeletal: Normal range of motion.  Neurological: She is alert and oriented to person, place, and time.  Skin: Skin is warm and dry.  Psychiatric: She has a normal mood and affect. Her behavior is normal.      Assessment:     Symptomatic gallstones     Plan:     Because the risk of further painful episodes and possible pancreatitis I think she would benefit from having her gallbladder removed. She would also like to have this done. She has already received cardiac clearance from Dr. Swaziland. She should stop her Plavix 5 days beforehand. Unfortunately she is not allowed to stop her aspirin which puts her at higher risk for having a bleeding complication. I have discussed her in detail the risks and benefits of the operation remove the gallbladder as well as some of the technical aspects and she understands and wishes to proceed               Not recorded  Level of Service     PR OFFICE CONSULTATION NEW/ESTAB PATIENT 60 MIN [99244]           All Flowsheet Templates (all recorded)     Encounter Vitals Flowsheet   Custom Formula Data Flowsheet   Anthropometrics Flowsheet                           Referring Provider          Daisy Floro, MD            All Charges for This Encounter       Code  Description  Service Date  Service Provider  Modifiers  Quantity    [phone]  PR OFFICE OUTPATIENT NEW 45 MINUTES  05/28/2011  Robyne Askew, MD   1    [phone]  PR CURRENT TOBACCO NON-USER  05/28/2011  Robyne Askew, MD   1                Other Encounter Related Information     Allergies & Medications      Problem List      History      Patient-Entered Questionnaires      Printed AVS Reports        Printed at    Printed by    05/28/2011 10:34 AM  After Visit Summary  Robyne Askew, MD           No data filed

## 2011-07-04 NOTE — Interval H&P Note (Signed)
History and Physical Interval Note:  07/04/2011 6:57 AM  Pamela Gonzales  has presented today for surgery, with the diagnosis of GALL STONES  The various methods of treatment have been discussed with the patient and family. After consideration of risks, benefits and other options for treatment, the patient has consented to  Procedure(s) (LRB): LAPAROSCOPIC CHOLECYSTECTOMY WITH INTRAOPERATIVE CHOLANGIOGRAM (N/A) as a surgical intervention .  The patients' history has been reviewed, patient examined, no change in status, stable for surgery.  I have reviewed the patients' chart and labs.  Questions were answered to the patient's satisfaction.     TOTH III,Jary Louvier S

## 2011-07-04 NOTE — Anesthesia Postprocedure Evaluation (Signed)
Anesthesia Post Note  Patient: Pamela Gonzales  Procedure(s) Performed: Procedure(s) (LRB): LAPAROSCOPIC CHOLECYSTECTOMY WITH INTRAOPERATIVE CHOLANGIOGRAM (N/A)  Anesthesia type: general  Patient location: PACU  Post pain: Pain level controlled  Post assessment: Patient's Cardiovascular Status Stable  Last Vitals:  Filed Vitals:   07/04/11 1000  BP: 164/84  Pulse: 58  Temp: 36.1 C  Resp: 16    Post vital signs: Reviewed and stable  Level of consciousness: sedated  Complications: No apparent anesthesia complications

## 2011-07-04 NOTE — Anesthesia Preprocedure Evaluation (Addendum)
Anesthesia Evaluation    Airway       Dental   Pulmonary          Cardiovascular + CAD     Neuro/Psych    GI/Hepatic Neg liver ROS, GERD-  Medicated and Controlled,  Endo/Other  negative endocrine ROS  Renal/GU negative Renal ROS     Musculoskeletal   Abdominal   Peds  Hematology   Anesthesia Other Findings   Reproductive/Obstetrics                           Anesthesia Physical Anesthesia Plan Anesthesia Quick Evaluation

## 2011-07-04 NOTE — Op Note (Signed)
07/04/2011  8:25 AM  PATIENT:  Pamela Gonzales  78 y.o. female  PRE-OPERATIVE DIAGNOSIS:  GALL STONES  POST-OPERATIVE DIAGNOSIS:  gallstones  PROCEDURE:  Procedure(s) (LRB): LAPAROSCOPIC CHOLECYSTECTOMY WITH INTRAOPERATIVE CHOLANGIOGRAM (N/A)  SURGEON:  Surgeon(s) and Role:    * Robyne Askew, MD - Primary    * Ernestene Mention, MD - Assisting  PHYSICIAN ASSISTANT:   ASSISTANTS: Dr. Derrell Lolling   ANESTHESIA:   general  EBL:  Total I/O In: 200 [I.V.:200] Out: -   BLOOD ADMINISTERED:none  DRAINS: none   LOCAL MEDICATIONS USED:  MARCAINE     SPECIMEN:  Source of Specimen:  gallbladder  DISPOSITION OF SPECIMEN:  PATHOLOGY  COUNTS:  YES  TOURNIQUET:  * No tourniquets in log *  DICTATION: .Dragon Dictation @opnoteheader @  Procedure: After informed consent was obtained the patient was brought to the operating room and placed in the supine position on the operating room table. After adequate induction of general anesthesia the patient's abdomen was prepped with ChloraPrep allowed to dry and draped in usual sterile manner. The area below the umbilicus was infiltrated with quarter percent  Marcaine. A small incision was made with a 15 blade knife. The incision was carried down through the subcutaneous tissue bluntly with a hemostat and [employer] retractors. The linea alba was identified. The linea alba was incised with a 15 blade knife and each side was grasped with Coker clamps. The preperitoneal space was then probed with a hemostat until the peritoneum was opened and access was gained to the abdominal cavity. A 0 Vicryl pursestring stitch was placed in the fascia surrounding the opening. A Hassan cannula was then placed through the opening and anchored in place with the previously placed Vicryl purse string stitch. The abdomen was insufflated with carbon dioxide without difficulty. A laparoscope was inserted through the [facility] cannula in the right upper quadrant was inspected. Next the  epigastric region was infiltrated with % Marcaine. A small incision was made with a 15 blade knife. A 5 mm port was placed bluntly through this incision into the abdominal cavity under direct vision. Next 2 sites were chosen laterally on the right side of the abdomen for placement of 5 mm ports. Each of these areas was infiltrated with quarter percent Marcaine. Small stab incisions were made with a 15 blade knife. 5 mm ports were then placed bluntly through these incisions into the abdominal cavity under direct vision without difficulty. A blunt grasper was placed through the lateralmost 5 mm port and used to grasp the dome of the gallbladder and elevated anteriorly and superiorly. Another blunt grasper was placed through the other 5 mm port and used to retract the body and neck of the gallbladder. A dissector was placed through the epigastric port and using the electrocautery the peritoneal reflection at the gallbladder neck was opened. Blunt dissection was then carried out in this area until the gallbladder neck-cystic duct junction was readily identified and a good window was created. A single clip was placed on the gallbladder neck. A small  ductotomy was made just below the clip with laparoscopic scissors. A 14-gauge Angiocath was then placed through the anterior abdominal wall under direct vision. A Reddick cholangiogram catheter was then placed through the Angiocath and flushed. The catheter was then placed in the cystic duct and anchored in place with a clip. A cholangiogram was obtained that showed no filling defects good emptying into the duodenum an adequate length on the cystic duct. The anchoring clip  and catheters were then removed from the patient. 3 clips were placed proximally on the cystic duct and the duct was divided between the 2 sets of clips. Posterior to this the cystic artery was identified and again dissected bluntly in a circumferential manner until a good window  was created. 2 clips  were placed proximally and one distally on the artery and the artery was divided between the 2 sets of clips. Next a laparoscopic hook cautery device was used to separate the gallbladder from the liver bed. Prior to completely detaching the gallbladder from the liver bed the liver bed was inspected and several small bleeding points were coagulated with the electrocautery until the area was completely hemostatic. The gallbladder was then detached the rest of it from the liver bed without difficulty. A laparoscopic bag was inserted through the [facility] canula. The gallbladder was placed within the bag and the bag was sealed. A laparoscope was then moved to the epigastric port.The bag with the gallbladder was then removed with the [facility] cannula through the infraumbilical port without difficulty. The fascial defect was then closed with the previously placed Vicryl pursestring stitch as well as with another figure-of-eight 0 Vicryl stitch. The liver bed was inspected again and found to be hemostatic. The abdomen was irrigated with copious amounts of saline until the effluent was clear. The ports were then removed under direct vision without difficulty and were found to be hemostatic. The gas was allowed to escape. The skin incisions were all closed with interrupted 4-0 Monocryl subcuticular stitches. Dermabond dressings were applied. The patient tolerated the procedure well. At the end of the case all needle sponge and instrument counts were correct. The patient was then awakened and taken to recovery in stable condition   PLAN OF CARE: Discharge to home after PACU  PATIENT DISPOSITION:  PACU - hemodynamically stable.   Delay start of Pharmacological VTE agent (>24hrs) due to surgical blood loss or risk of bleeding: not applicable

## 2011-07-04 NOTE — Transfer of Care (Signed)
Immediate Anesthesia Transfer of Care Note  Patient: Pamela Gonzales  Procedure(s) Performed: Procedure(s) (LRB): LAPAROSCOPIC CHOLECYSTECTOMY WITH INTRAOPERATIVE CHOLANGIOGRAM (N/A)  Patient Location: PACU  Anesthesia Type: General  Level of Consciousness: awake, oriented, patient cooperative and responds to stimulation  Airway & Oxygen Therapy: Patient Spontanous Breathing and Patient connected to nasal cannula oxygen  Post-op Assessment: Report given to PACU RN and Post -op Vital signs reviewed and stable  Post vital signs: Reviewed and stable  Complications: No apparent anesthesia complications

## 2011-07-04 NOTE — Preoperative (Signed)
Beta Blockers   Reason not to administer Beta Blockers:Not Applicable 

## 2011-07-05 ENCOUNTER — Encounter (HOSPITAL_COMMUNITY): Payer: Medicare Other

## 2011-07-05 ENCOUNTER — Encounter (HOSPITAL_COMMUNITY): Payer: Self-pay | Admitting: General Surgery

## 2011-07-09 ENCOUNTER — Encounter (HOSPITAL_COMMUNITY): Payer: Medicare Other

## 2011-07-11 ENCOUNTER — Encounter (HOSPITAL_COMMUNITY): Payer: Medicare Other

## 2011-07-12 ENCOUNTER — Encounter (HOSPITAL_COMMUNITY): Payer: Medicare Other

## 2011-07-15 ENCOUNTER — Encounter (INDEPENDENT_AMBULATORY_CARE_PROVIDER_SITE_OTHER): Payer: Medicare Other | Admitting: General Surgery

## 2011-07-16 ENCOUNTER — Encounter (HOSPITAL_COMMUNITY): Payer: Medicare Other

## 2011-07-18 ENCOUNTER — Encounter (HOSPITAL_COMMUNITY): Payer: Medicare Other

## 2011-07-19 ENCOUNTER — Encounter (HOSPITAL_COMMUNITY): Payer: Medicare Other

## 2011-07-23 ENCOUNTER — Encounter (HOSPITAL_COMMUNITY): Payer: Medicare Other

## 2011-07-25 ENCOUNTER — Encounter (HOSPITAL_COMMUNITY): Payer: Medicare Other

## 2011-07-26 ENCOUNTER — Encounter (HOSPITAL_COMMUNITY): Payer: Medicare Other

## 2011-07-29 ENCOUNTER — Encounter (INDEPENDENT_AMBULATORY_CARE_PROVIDER_SITE_OTHER): Payer: Medicare Other | Admitting: General Surgery

## 2011-07-30 ENCOUNTER — Encounter (HOSPITAL_COMMUNITY): Payer: Medicare Other

## 2011-08-01 ENCOUNTER — Encounter (HOSPITAL_COMMUNITY): Payer: Medicare Other

## 2011-08-02 ENCOUNTER — Encounter (HOSPITAL_COMMUNITY): Payer: Medicare Other

## 2011-08-06 ENCOUNTER — Encounter (HOSPITAL_COMMUNITY): Payer: Medicare Other

## 2011-08-08 ENCOUNTER — Encounter (HOSPITAL_COMMUNITY): Payer: Medicare Other

## 2011-08-08 ENCOUNTER — Ambulatory Visit (INDEPENDENT_AMBULATORY_CARE_PROVIDER_SITE_OTHER): Payer: Medicare Other | Admitting: General Surgery

## 2011-08-08 ENCOUNTER — Encounter (INDEPENDENT_AMBULATORY_CARE_PROVIDER_SITE_OTHER): Payer: Self-pay | Admitting: General Surgery

## 2011-08-08 VITALS — BP 139/84 | HR 80 | Temp 98.3°F | Resp 14 | Ht 65.0 in | Wt 185.6 lb

## 2011-08-08 DIAGNOSIS — K8020 Calculus of gallbladder without cholecystitis without obstruction: Secondary | ICD-10-CM

## 2011-08-08 NOTE — Progress Notes (Signed)
Subjective:     Patient ID: Elam Dutch, female   DOB: [birth year], 78 y.o.   MRN: [id number]  HPI The patient is a 78 year old white female who is about a month out from a laparoscopic cholecystectomy for cholecystitis and cholelithiasis. Early postop she did not have a bowel movement for 4 days and she feels as though this may have flared up some diverticulitis. She was treated with antibiotics by her medical doctor and her discomfort resolved. Her appetite is good and her bowels are working normally now. She continues to have some discomfort along her left flank but this is been going on for a long time.  Review of Systems     Objective:   Physical Exam On exam her abdomen is soft and nontender. Her incisions are all healing nicely with no sign of infection.    Assessment:     One month status post laparoscopic cholecystectomy    Plan:     Overall she is doing well. I recommended light activity in gym for the next couple weeks and then she may return to normal. We will plan to see her back on a p.r.n. basis.

## 2011-08-08 NOTE — Patient Instructions (Signed)
Light activity for the next 2 weeks then you may return to normal

## 2011-08-09 ENCOUNTER — Encounter (HOSPITAL_COMMUNITY): Payer: Medicare Other

## 2011-08-13 ENCOUNTER — Encounter (HOSPITAL_COMMUNITY): Payer: Medicare Other

## 2011-08-15 ENCOUNTER — Encounter (HOSPITAL_COMMUNITY): Payer: Medicare Other

## 2011-08-16 ENCOUNTER — Encounter (HOSPITAL_COMMUNITY): Payer: Medicare Other

## 2011-08-20 ENCOUNTER — Encounter (HOSPITAL_COMMUNITY): Payer: Medicare Other

## 2011-08-22 ENCOUNTER — Encounter (INDEPENDENT_AMBULATORY_CARE_PROVIDER_SITE_OTHER): Payer: Self-pay

## 2011-08-22 ENCOUNTER — Encounter (HOSPITAL_COMMUNITY): Payer: Medicare Other

## 2011-08-23 ENCOUNTER — Encounter (HOSPITAL_COMMUNITY): Payer: Medicare Other

## 2011-08-27 ENCOUNTER — Encounter (HOSPITAL_COMMUNITY): Payer: Medicare Other

## 2011-08-29 ENCOUNTER — Encounter (HOSPITAL_COMMUNITY): Payer: Medicare Other

## 2011-08-30 ENCOUNTER — Encounter (HOSPITAL_COMMUNITY): Payer: Medicare Other

## 2011-09-03 ENCOUNTER — Encounter (HOSPITAL_COMMUNITY): Payer: Medicare Other

## 2011-09-05 ENCOUNTER — Encounter (HOSPITAL_COMMUNITY): Payer: Medicare Other

## 2011-09-06 ENCOUNTER — Encounter (HOSPITAL_COMMUNITY): Payer: Medicare Other

## 2011-09-10 ENCOUNTER — Encounter (HOSPITAL_COMMUNITY): Payer: Medicare Other

## 2011-09-12 ENCOUNTER — Encounter (HOSPITAL_COMMUNITY): Payer: Medicare Other

## 2011-09-13 ENCOUNTER — Other Ambulatory Visit: Payer: Self-pay | Admitting: Obstetrics and Gynecology

## 2011-09-13 ENCOUNTER — Encounter (HOSPITAL_COMMUNITY): Payer: Medicare Other

## 2011-09-13 DIAGNOSIS — M8580 Other specified disorders of bone density and structure, unspecified site: Secondary | ICD-10-CM

## 2011-09-13 DIAGNOSIS — Z1231 Encounter for screening mammogram for malignant neoplasm of breast: Secondary | ICD-10-CM

## 2011-09-17 ENCOUNTER — Encounter (HOSPITAL_COMMUNITY): Payer: Medicare Other

## 2011-09-19 ENCOUNTER — Encounter (HOSPITAL_COMMUNITY): Payer: Medicare Other

## 2011-09-20 ENCOUNTER — Encounter (HOSPITAL_COMMUNITY): Payer: Medicare Other

## 2011-09-24 ENCOUNTER — Encounter (HOSPITAL_COMMUNITY): Payer: Medicare Other

## 2011-09-26 ENCOUNTER — Encounter (HOSPITAL_COMMUNITY): Payer: Medicare Other

## 2011-09-27 ENCOUNTER — Encounter (HOSPITAL_COMMUNITY): Payer: Medicare Other

## 2011-10-01 ENCOUNTER — Encounter (HOSPITAL_COMMUNITY): Payer: Medicare Other

## 2011-10-02 ENCOUNTER — Other Ambulatory Visit: Payer: Self-pay | Admitting: *Deleted

## 2011-10-02 MED ORDER — CLOPIDOGREL BISULFATE 75 MG PO TABS: 75.0000 mg | ORAL_TABLET | Freq: Every day | ORAL | Status: DC

## 2011-10-02 MED ORDER — ATORVASTATIN CALCIUM 40 MG PO TABS: 40.0000 mg | ORAL_TABLET | Freq: Every day | ORAL | Status: DC

## 2011-10-03 ENCOUNTER — Encounter (HOSPITAL_COMMUNITY): Payer: Medicare Other

## 2011-10-04 ENCOUNTER — Encounter (HOSPITAL_COMMUNITY): Payer: Medicare Other

## 2011-10-08 ENCOUNTER — Encounter (HOSPITAL_COMMUNITY): Payer: Medicare Other

## 2011-10-10 ENCOUNTER — Encounter (HOSPITAL_COMMUNITY): Payer: Medicare Other

## 2011-10-11 ENCOUNTER — Encounter (HOSPITAL_COMMUNITY): Payer: Medicare Other

## 2011-10-15 ENCOUNTER — Encounter (HOSPITAL_COMMUNITY): Payer: Medicare Other

## 2011-10-17 ENCOUNTER — Encounter (HOSPITAL_COMMUNITY): Payer: Medicare Other

## 2011-10-18 ENCOUNTER — Encounter (HOSPITAL_COMMUNITY): Payer: Medicare Other

## 2011-10-22 ENCOUNTER — Encounter (HOSPITAL_COMMUNITY): Payer: Medicare Other

## 2011-10-24 ENCOUNTER — Encounter (HOSPITAL_COMMUNITY): Payer: Medicare Other

## 2011-10-25 ENCOUNTER — Encounter (HOSPITAL_COMMUNITY): Payer: Medicare Other

## 2011-10-29 ENCOUNTER — Encounter (HOSPITAL_COMMUNITY): Payer: Medicare Other

## 2011-10-31 ENCOUNTER — Encounter (HOSPITAL_COMMUNITY): Payer: Medicare Other

## 2011-11-01 ENCOUNTER — Encounter (HOSPITAL_COMMUNITY): Payer: Medicare Other

## 2011-11-05 ENCOUNTER — Encounter (HOSPITAL_COMMUNITY): Payer: Medicare Other

## 2011-11-07 ENCOUNTER — Encounter (HOSPITAL_COMMUNITY): Payer: Medicare Other

## 2011-11-08 ENCOUNTER — Encounter (HOSPITAL_COMMUNITY): Payer: Medicare Other

## 2011-11-12 ENCOUNTER — Encounter (HOSPITAL_COMMUNITY): Payer: Medicare Other

## 2011-11-14 ENCOUNTER — Encounter (HOSPITAL_COMMUNITY): Payer: Medicare Other

## 2011-11-15 ENCOUNTER — Encounter (HOSPITAL_COMMUNITY): Payer: Medicare Other

## 2011-11-18 ENCOUNTER — Ambulatory Visit
Admission: RE | Admit: 2011-11-18 | Discharge: 2011-11-18 | Disposition: A | Discharge status: Home / Self Care | Payer: Medicare Other | Source: Ambulatory Visit | Attending: Obstetrics and Gynecology | Admitting: Obstetrics and Gynecology

## 2011-11-18 ENCOUNTER — Ambulatory Visit: Payer: Medicare Other

## 2011-11-18 DIAGNOSIS — M8580 Other specified disorders of bone density and structure, unspecified site: Secondary | ICD-10-CM

## 2011-11-18 DIAGNOSIS — Z1231 Encounter for screening mammogram for malignant neoplasm of breast: Secondary | ICD-10-CM

## 2011-11-19 ENCOUNTER — Encounter (HOSPITAL_COMMUNITY): Payer: Medicare Other

## 2011-11-21 ENCOUNTER — Encounter (HOSPITAL_COMMUNITY): Payer: Medicare Other

## 2011-11-22 ENCOUNTER — Telehealth: Payer: Self-pay | Admitting: Cardiology

## 2011-11-22 ENCOUNTER — Encounter (HOSPITAL_COMMUNITY): Payer: Medicare Other

## 2011-11-22 NOTE — Telephone Encounter (Signed)
New Problem:    Paient called in wanting to schedule an appointment with Dr. Swaziland.  Wanted to be seen sooner rather than later, claims that she is having BP issues, but declined when offered to see NP/PA because she has never seen them and has not established confidence in them.  Please call back.

## 2011-11-22 NOTE — Telephone Encounter (Signed)
Spoke to patient she stated her B/P has been averaging 138/78.States that is higher than normal.States she will continue to monitor B/P and if gets higher she will call back States she will keep appointment with Dr.Jordan 12/23/11.

## 2011-11-26 ENCOUNTER — Encounter (HOSPITAL_COMMUNITY): Payer: Medicare Other

## 2011-11-28 ENCOUNTER — Encounter (HOSPITAL_COMMUNITY): Payer: Medicare Other

## 2011-12-13 ENCOUNTER — Telehealth: Payer: Self-pay | Admitting: Cardiology

## 2011-12-13 NOTE — Telephone Encounter (Signed)
New problem:   Office fax over patient bone density results. Is re clast is safe for the patient.

## 2011-12-13 NOTE — Telephone Encounter (Signed)
[facility] called left message on Pamela Gonzales's voice mail spoke to Dr.Jordan he advised ok for patient to take reclast.

## 2011-12-18 ENCOUNTER — Other Ambulatory Visit (INDEPENDENT_AMBULATORY_CARE_PROVIDER_SITE_OTHER): Payer: Medicare Other

## 2011-12-18 DIAGNOSIS — I2510 Atherosclerotic heart disease of native coronary artery without angina pectoris: Secondary | ICD-10-CM

## 2011-12-18 DIAGNOSIS — K8020 Calculus of gallbladder without cholecystitis without obstruction: Secondary | ICD-10-CM

## 2011-12-18 DIAGNOSIS — E785 Hyperlipidemia, unspecified: Secondary | ICD-10-CM

## 2011-12-18 LAB — BASIC METABOLIC PANEL
CO2: 31 mEq/L (ref 19–32)
Calcium: 9.5 mg/dL (ref 8.4–10.5)
Chloride: 103 mEq/L (ref 96–112)
Creatinine, Ser: 0.8 mg/dL (ref 0.4–1.2)
Glucose, Bld: 89 mg/dL (ref 70–99)
Sodium: 140 mEq/L (ref 135–145)

## 2011-12-18 LAB — LIPID PANEL
HDL: 58 mg/dL (ref >39.00)
LDL Cholesterol: 93 mg/dL (ref 0–99)
Total CHOL/HDL Ratio: 3

## 2011-12-18 LAB — HEPATIC FUNCTION PANEL
AST: 25 U/L (ref 0–37)
Albumin: 4 g/dL (ref 3.5–5.2)
Total Bilirubin: 1 mg/dL (ref 0.3–1.2)

## 2011-12-19 ENCOUNTER — Telehealth: Payer: Self-pay | Admitting: Cardiology

## 2011-12-19 NOTE — Telephone Encounter (Signed)
Patient called lab results given.Copy sent to [facility].

## 2011-12-19 NOTE — Telephone Encounter (Signed)
New Problem: ° ° ° °Patient returned your call about her lab work.  Please call back. °

## 2011-12-23 ENCOUNTER — Ambulatory Visit (INDEPENDENT_AMBULATORY_CARE_PROVIDER_SITE_OTHER): Payer: Medicare Other | Admitting: Cardiology

## 2011-12-23 ENCOUNTER — Encounter: Payer: Self-pay | Admitting: Cardiology

## 2011-12-23 VITALS — BP 120/86 | HR 77 | Ht 65.0 in | Wt 189.0 lb

## 2011-12-23 DIAGNOSIS — R030 Elevated blood-pressure reading, without diagnosis of hypertension: Secondary | ICD-10-CM

## 2011-12-23 DIAGNOSIS — I2510 Atherosclerotic heart disease of native coronary artery without angina pectoris: Secondary | ICD-10-CM

## 2011-12-23 DIAGNOSIS — E7800 Pure hypercholesterolemia, unspecified: Secondary | ICD-10-CM

## 2011-12-23 NOTE — Progress Notes (Signed)
HPI Pamela Gonzales is seen for followup today. She continues her exercise program at [place]. She has any significant cardiac complaints. She specifically denies any chest pain, shortness of breath, or palpitations. She does note on her blood pressure readings that occasionally her blood pressure will get up into the low 140s systolic. Since her last visit here she did undergo successful cholecystectomy in June. This was uneventful. She is concerned about a new diagnosis of osteoporosis and is planning to start Reclast next week.  Allergies  Allergen Reactions  . Cephalexin   . Penicillins   . Sulfa Drugs Cross Reactors Other (See Comments)    Skin irritation.    Current Outpatient Prescriptions on File Prior to Visit  Medication Sig Dispense Refill  . aspirin 81 MG tablet Take 81 mg by mouth daily.        Marland Kitchen atorvastatin (LIPITOR) 40 MG tablet Take 1 tablet (40 mg total) by mouth daily.  30 tablet  3  . calcitonin, salmon, (MIACALCIN) 200 UNIT/ACT nasal spray 1 spray by Nasal route daily.        . Calcium Citrate-Vitamin D (CITRACAL + D PO) Take by mouth.      . clopidogrel (PLAVIX) 75 MG tablet Take 1 tablet (75 mg total) by mouth daily.  30 tablet  3  . NASONEX 50 MCG/ACT nasal spray 2 sprays by Nasal route Daily.      Marland Kitchen omeprazole (PRILOSEC) 20 MG capsule Take 20 mg by mouth daily.         Past Medical History  Diagnosis Date  . Hypercholesteremia   . Lupus   . GERD (gastroesophageal reflux disease)   . Spinal stenosis   . Fibula fracture     right  . Gallstones   . Arthritis   . CAD (coronary artery disease) 05    stents    Past Surgical History  Procedure Date  . Coronary angioplasty 2005  . Breast lumpectomy 1970  . Abdominal hysterectomy 1975  . Fibula fracture surgery 2011    Right Ankle  . Colonoscopy w/ biopsies   . Cholecystectomy 07/04/2011    Procedure: LAPAROSCOPIC CHOLECYSTECTOMY WITH INTRAOPERATIVE CHOLANGIOGRAM;  Surgeon: Robyne Askew, MD;  Location: [facility] OR;   Service: General;  Laterality: N/A;    Family History  Problem Relation Age of Onset  . Cancer Father   . Heart attack Mother   . Heart attack Brother   . Cancer Sister   . Cancer Sister   . Anesthesia problems Neg Hx   . Hypotension Neg Hx   . Malignant hyperthermia Neg Hx   . Pseudochol deficiency Neg Hx     History   Social History  . Marital Status: Divorced    Spouse Name: N/A    Number of Children: 2  . Years of Education: N/A   Occupational History  . insurance sales     retired   Social History Main Topics  . Smoking status: Never Smoker   . Smokeless tobacco: Not on file  . Alcohol Use: No  . Drug Use: No  . Sexually Active: Not on file   Other Topics Concern  . Not on file   Social History Narrative  . No narrative on file    ROS  as noted in history of present illness. All other systems were reviewed and are negative.  PHYSICAL EXAM BP 120/86  Pulse 77  Ht 5\' 5"  (1.651 m)  Wt 189 lb (85.73 kg)  BMI 31.45 kg/m2  SpO2 97% The patient is an obese white female in no acute distress.  The skin is warm and dry.   The HEENT exam reveals that the sclera are nonicteric.  The mucous membranes are moist.  The carotids are 2+ without bruits.  There is no thyromegaly.  There is no JVD.  The lungs are clear. The heart exam reveals a regular rate with a normal S1 and S2.  There are no murmurs, gallops, or rubs.  The PMI is not displaced.   Abdominal exam reveals good bowel sounds.  There is no guarding or rebound.  There is no hepatosplenomegaly or tenderness.  There are no masses.  Exam of the legs reveal no clubbing, cyanosis, or edema.  The legs are without rashes.  The distal pulses are intact.  Cranial nerves II - XII are intact.  Motor and sensory functions are intact.  The gait is normal.  Laboratory data:  Lab Results  Component Value Date   CHOL 164 12/18/2011   HDL 58.00 12/18/2011   LDLCALC 93 12/18/2011   TRIG 66.0 12/18/2011   CHOLHDL 3 12/18/2011       ASSESSMENT AND PLAN  1. Coronary disease status post stenting of the mid LAD and the second obtuse marginal vessel in 2005 with Cypher stents. Myoview study in April 2012 showed a small fixed apical perfusion defect with normal ejection fraction. She remains asymptomatic. We'll continue therapy with aspirin, Plavix, and statin therapy.  2. GERD. She has been on chronic Prilosec. I recommended stopping this in favor of an H2 blocker given the interaction with Plavix. If she needs something stronger we'll I would recommend Protonix.  3. Elevated blood pressure. She's had some intermittent elevation into the 140s. I recommended focusing on weight loss and sodium restriction. Continue with aerobic activity. We will monitor for now.  4. Hypercholesterolemia, controlled on Lipitor.

## 2011-12-23 NOTE — Patient Instructions (Addendum)
Continue to exercise and try and lose weight.  Try Pepcid 20 mg daily for GERD and stop prilosec.  I will see you again in 6 months.

## 2011-12-27 ENCOUNTER — Other Ambulatory Visit (HOSPITAL_COMMUNITY): Payer: Self-pay | Admitting: *Deleted

## 2011-12-30 ENCOUNTER — Encounter (HOSPITAL_COMMUNITY)
Admission: RE | Admit: 2011-12-30 | Discharge: 2011-12-30 | Disposition: A | Discharge status: Home / Self Care | Payer: Medicare Other | Source: Ambulatory Visit | Attending: Obstetrics and Gynecology | Admitting: Obstetrics and Gynecology

## 2011-12-30 DIAGNOSIS — M810 Age-related osteoporosis without current pathological fracture: Secondary | ICD-10-CM | POA: Insufficient documentation

## 2011-12-30 MED ORDER — ZOLEDRONIC ACID 5 MG/100ML IV SOLN
5.0000 mg | Freq: Once | INTRAVENOUS | Status: AC
  Administered 2011-12-30: 5 mg via INTRAVENOUS

## 2012-02-07 ENCOUNTER — Telehealth: Payer: Self-pay | Admitting: Cardiology

## 2012-02-07 ENCOUNTER — Other Ambulatory Visit: Payer: Self-pay

## 2012-02-07 ENCOUNTER — Other Ambulatory Visit: Payer: Self-pay | Admitting: Family Medicine

## 2012-02-07 ENCOUNTER — Other Ambulatory Visit: Payer: Self-pay | Admitting: *Deleted

## 2012-02-07 DIAGNOSIS — R9389 Abnormal findings on diagnostic imaging of other specified body structures: Secondary | ICD-10-CM

## 2012-02-07 MED ORDER — ATORVASTATIN CALCIUM 40 MG PO TABS: 40.0000 mg | ORAL_TABLET | Freq: Every day | ORAL | Status: DC

## 2012-02-07 MED ORDER — CLOPIDOGREL BISULFATE 75 MG PO TABS: 75.0000 mg | ORAL_TABLET | Freq: Every day | ORAL | Status: DC

## 2012-02-07 NOTE — Telephone Encounter (Signed)
Pt was notified of this 

## 2012-02-07 NOTE — Telephone Encounter (Signed)
I don't think this is too much folic acid. Could reduce from 2 mg to 1 mg if she is concerned.  Peter Swaziland MD, [facility]

## 2012-02-07 NOTE — Telephone Encounter (Signed)
Will forward to Dr Swaziland for his input and pt was notified of this.

## 2012-02-07 NOTE — Telephone Encounter (Signed)
Pt states she will cut back to folic acid 1 mg and would like her medication list changed to reflect this.  I assured her I would document this and did.

## 2012-02-07 NOTE — Telephone Encounter (Signed)
Pt is already on folic acid 2 mg qd and her PCP put her on centrum silver that has 400 mcg of folic acid in it and she want so know is that too much folic acid

## 2012-02-11 ENCOUNTER — Ambulatory Visit
Admission: RE | Admit: 2012-02-11 | Discharge: 2012-02-11 | Disposition: A | Discharge status: Home / Self Care | Payer: No Typology Code available for payment source | Source: Ambulatory Visit | Attending: Family Medicine | Admitting: Family Medicine

## 2012-02-11 DIAGNOSIS — R9389 Abnormal findings on diagnostic imaging of other specified body structures: Secondary | ICD-10-CM

## 2012-02-11 MED ORDER — IOHEXOL 300 MG/ML  SOLN
75.0000 mL | Freq: Once | INTRAMUSCULAR | Status: AC | PRN
  Administered 2012-02-11: 75 mL via INTRAVENOUS

## 2012-03-14 ENCOUNTER — Other Ambulatory Visit: Payer: Self-pay

## 2012-06-03 ENCOUNTER — Encounter: Payer: Self-pay | Admitting: Cardiology

## 2012-06-10 ENCOUNTER — Ambulatory Visit (INDEPENDENT_AMBULATORY_CARE_PROVIDER_SITE_OTHER): Payer: Medicare Other | Admitting: Cardiology

## 2012-06-10 ENCOUNTER — Encounter: Payer: Self-pay | Admitting: Cardiology

## 2012-06-10 VITALS — BP 149/82 | HR 73 | Ht 65.0 in | Wt 190.8 lb

## 2012-06-10 DIAGNOSIS — I2510 Atherosclerotic heart disease of native coronary artery without angina pectoris: Secondary | ICD-10-CM

## 2012-06-10 DIAGNOSIS — E7800 Pure hypercholesterolemia, unspecified: Secondary | ICD-10-CM

## 2012-06-10 DIAGNOSIS — I4510 Unspecified right bundle-branch block: Secondary | ICD-10-CM

## 2012-06-10 NOTE — Patient Instructions (Addendum)
We will schedule you for a nuclear stress test.  You have a new Right bundle branch block  Continue your other therapy  I will see you in 6 months with fasting labs.  Keep an eye on your blood pressure for now.

## 2012-06-10 NOTE — Progress Notes (Signed)
HPI Pamela Gonzales is seen for followup today. She continues to do very well. She is active with her exercise program. The past few weeks she has noticed some intermittent elevation of her diastolic blood pressure. She relates this to some family stress. She denies any chest pain, shortness of breath, or palpitations. She's had no dizziness or syncope.  Allergies  Allergen Reactions  . Cephalexin   . Penicillins   . Sulfa Drugs Cross Reactors Other (See Comments)    Skin irritation.    Current Outpatient Prescriptions on File Prior to Visit  Medication Sig Dispense Refill  . aspirin 81 MG tablet Take 81 mg by mouth daily.        Marland Kitchen atorvastatin (LIPITOR) 40 MG tablet Take 1 tablet (40 mg total) by mouth daily.  30 tablet  5  . calcitonin, salmon, (MIACALCIN) 200 UNIT/ACT nasal spray 1 spray by Nasal route daily.        . Calcium Citrate-Vitamin D (CITRACAL + D PO) Take by mouth.      . clopidogrel (PLAVIX) 75 MG tablet Take 1 tablet (75 mg total) by mouth daily.  30 tablet  5  . folic acid (FOLVITE) 1 MG tablet Take 1 tablet (1 mg total) by mouth daily.  30 tablet  0  . GuaiFENesin (MUCINEX PO) Take by mouth.      Marland Kitchen NASONEX 50 MCG/ACT nasal spray 2 sprays by Nasal route Daily.       No current facility-administered medications on file prior to visit.    Past Medical History  Diagnosis Date  . Hypercholesteremia   . Lupus   . GERD (gastroesophageal reflux disease)   . Spinal stenosis   . Fibula fracture     right  . Gallstones   . Arthritis   . CAD (coronary artery disease) 05    stents  . RBBB     Past Surgical History  Procedure Laterality Date  . Coronary angioplasty  2005  . Breast lumpectomy  1970  . Abdominal hysterectomy  1975  . Fibula fracture surgery  2011    Right Ankle  . Colonoscopy w/ biopsies    . Cholecystectomy  07/04/2011    Procedure: LAPAROSCOPIC CHOLECYSTECTOMY WITH INTRAOPERATIVE CHOLANGIOGRAM;  Surgeon: Robyne Askew, MD;  Location: [facility] OR;  Service:  General;  Laterality: N/A;    Family History  Problem Relation Age of Onset  . Cancer Father   . Heart attack Mother   . Heart attack Brother   . Cancer Sister   . Cancer Sister   . Anesthesia problems Neg Hx   . Hypotension Neg Hx   . Malignant hyperthermia Neg Hx   . Pseudochol deficiency Neg Hx     History   Social History  . Marital Status: Divorced    Spouse Name: N/A    Number of Children: 2  . Years of Education: N/A   Occupational History  . insurance sales     retired   Social History Main Topics  . Smoking status: Never Smoker   . Smokeless tobacco: Not on file  . Alcohol Use: No  . Drug Use: No  . Sexually Active: Not on file   Other Topics Concern  . Not on file   Social History Narrative  . No narrative on file    ROS  as noted in history of present illness. All other systems were reviewed and are negative.  PHYSICAL EXAM BP 149/82  Pulse 73  Ht 5\' 5"  (  1.651 m)  Wt 190 lb 12.8 oz (86.546 kg)  BMI 31.75 kg/m2 The patient is an obese white female in no acute distress.  The skin is warm and dry.   The HEENT exam is normal. The carotids are 2+ without bruits.  There is no thyromegaly.  There is no JVD.  The lungs are clear. The heart exam reveals a regular rate with a normal S1 and S2.  There are no murmurs, gallops, or rubs.  The PMI is not displaced.   Abdominal exam reveals good bowel sounds.    There are no masses.  Exam of the legs reveal no edema.  The legs are without rashes.  The distal pulses are intact.  Cranial nerves II - XII are intact.  Motor and sensory functions are intact.  The gait is normal.  Laboratory data:  Lab Results  Component Value Date   CHOL 164 12/18/2011   HDL 58.00 12/18/2011   LDLCALC 93 12/18/2011   TRIG 66.0 12/18/2011   CHOLHDL 3 12/18/2011    ECG today demonstrates normal sinus rhythm with a new right bundle branch block. Outside stairs to light and and the and a to a  ASSESSMENT AND PLAN  1. Coronary  disease status post stenting of the mid LAD and the second obtuse marginal vessel in 2005 with Cypher stents. Myoview study in April 2012 showed a small fixed apical perfusion defect with normal ejection fraction. She remains asymptomatic. We'll continue therapy with aspirin, Plavix, and statin therapy. She is concerned about her new right bundle branch block. We will update her Myoview study. If normal we can reassure her.  2. GERD.   3. Elevated blood pressure. She's had some intermittent elevation of her diastolic pressures. I recommended focusing on weight loss and sodium restriction. Continue with aerobic activity. We will monitor for now. If her blood pressures remain consistently high we may need to add antihypertensive therapy.  4. Hypercholesterolemia, controlled on Lipitor.   5. Right bundle branch block-new. This is probably benign. If her stress test is normal we will reassure her.

## 2012-06-12 ENCOUNTER — Telehealth: Payer: Self-pay | Admitting: Cardiology

## 2012-06-12 NOTE — Telephone Encounter (Signed)
Spoke with patient who had questions regarding her medication list from last visit with Dr. Swaziland 5/14.  I answered patient's questions regarding brand names of medications and patient asked that we mark Calcitonin nasal spray off her list.  I have d/c'ed that medication from patient's med list.

## 2012-06-12 NOTE — Telephone Encounter (Signed)
New problem   Pt want to speak to someone to go over her current medication list. Please call pt

## 2012-06-25 ENCOUNTER — Ambulatory Visit (HOSPITAL_COMMUNITY): Payer: Medicare Other | Attending: Cardiology | Admitting: Radiology

## 2012-06-25 VITALS — BP 145/78 | HR 65 | Ht 65.0 in | Wt 190.0 lb

## 2012-06-25 DIAGNOSIS — E785 Hyperlipidemia, unspecified: Secondary | ICD-10-CM | POA: Insufficient documentation

## 2012-06-25 DIAGNOSIS — E669 Obesity, unspecified: Secondary | ICD-10-CM | POA: Insufficient documentation

## 2012-06-25 DIAGNOSIS — Z8249 Family history of ischemic heart disease and other diseases of the circulatory system: Secondary | ICD-10-CM | POA: Insufficient documentation

## 2012-06-25 DIAGNOSIS — I4510 Unspecified right bundle-branch block: Secondary | ICD-10-CM | POA: Insufficient documentation

## 2012-06-25 DIAGNOSIS — I2510 Atherosclerotic heart disease of native coronary artery without angina pectoris: Secondary | ICD-10-CM

## 2012-06-25 DIAGNOSIS — Z9861 Coronary angioplasty status: Secondary | ICD-10-CM | POA: Insufficient documentation

## 2012-06-25 DIAGNOSIS — E7800 Pure hypercholesterolemia, unspecified: Secondary | ICD-10-CM

## 2012-06-25 MED ORDER — TECHNETIUM TC 99M SESTAMIBI GENERIC - CARDIOLITE
11.0000 | Freq: Once | INTRAVENOUS | Status: AC | PRN
  Administered 2012-06-25: 11 via INTRAVENOUS

## 2012-06-25 MED ORDER — REGADENOSON 0.4 MG/5ML IV SOLN
0.4000 mg | Freq: Once | INTRAVENOUS | Status: AC
  Administered 2012-06-25: 0.4 mg via INTRAVENOUS

## 2012-06-25 MED ORDER — TECHNETIUM TC 99M SESTAMIBI GENERIC - CARDIOLITE
33.0000 | Freq: Once | INTRAVENOUS | Status: AC | PRN
  Administered 2012-06-25: 33 via INTRAVENOUS

## 2012-06-25 NOTE — Progress Notes (Signed)
[facility] SITE 3 NUCLEAR MED [address] [place], [place] [address] [phone]    Cardiology Nuclear Med Study  Pamela Gonzales is a 79 y.o. female     MRN : [id number]     DOB: [birth year]  Procedure Date: 06/25/2012  Nuclear Med Background Indication for Stress Test:  Evaluation for Ischemia, Stent Patency and New RBBB History:  '05 PTCA/Stent-LAD and OM-2; '12 KGM:WNUUV apical fixed perfusion defect, EF=72%; 1/14 OZ:[id number] calcifications Cardiac Risk Factors: Family History - CAD, Lipids and Obesity  Symptoms:  No cardiac symptoms.   Nuclear Pre-Procedure Caffeine/Decaff Intake:  None NPO After: 6:00pm   Lungs:  Clear. O2 Sat: 99% on room air. IV 0.9% NS with Angio Cath:  22g  IV Site: R Forearm  IV Started by:  Stanton Kidney, EMT-P  Chest Size (in):  40 Cup Size: DDDD  Height: 5\' 5"  (1.651 m)  Weight:  190 lb (86.183 kg)  BMI:  Body mass index is 31.62 kg/(m^2). Tech Comments:  Med's were taken as directed, per patient.    Nuclear Med Study 1 or 2 day study: 1 day  Stress Test Type:  Lexiscan  Reading MD: Willa Rough, MD  Order Authorizing Provider:  Peter Swaziland, MD  Resting Radionuclide: Technetium 81m Sestamibi  Resting Radionuclide Dose: 11.0 mCi   Stress Radionuclide:  Technetium 14m Sestamibi  Stress Radionuclide Dose: 33.0 mCi           Stress Protocol Rest HR: 65 Stress HR: 99  Rest BP: 145/78 Stress BP: 156/92  Exercise Time (min): n/a METS: n/a   Predicted Max HR: 141 bpm % Max HR: 70.21 bpm Rate Pressure Product: 74259   Dose of Adenosine (mg):  n/a Dose of Lexiscan: 0.4 mg  Dose of Atropine (mg): n/a Dose of Dobutamine: n/a mcg/kg/min (at max HR)  Stress Test Technologist: Smiley Houseman, CMA-N  Nuclear Technologist:  Domenic Polite, CNMT     Rest Procedure:  Myocardial perfusion imaging was performed at rest 45 minutes following the intravenous administration of Technetium 39m Sestamibi.  Rest ECG: Right bundle-branch block.  Normal sinus rhythm with PACs.  Stress Procedure:  The patient received IV Lexiscan 0.4 mg over 15-seconds.  Technetium 84m Sestamibi injected at 30-seconds.  She did c/o chest pressure with Lexiscan.  Quantitative spect images were obtained after a 45 minute delay.  Stress ECG: No significant change from baseline ECG  QPS Raw Data Images:  Normal; no motion artifact; normal heart/lung ratio. Stress Images:  Normal homogeneous uptake in all areas of the myocardium. Rest Images:  Normal homogeneous uptake in all areas of the myocardium. Subtraction (SDS):  No evidence of ischemia. Transient Ischemic Dilatation (Normal <1.22):  0.98 Lung/Heart Ratio (Normal <0.45):  0.26  Quantitative Gated Spect Images QGS EDV:  68 ml QGS ESV:  12 ml  Impression Exercise Capacity:  Lexiscan with no exercise. BP Response:  Normal blood pressure response. Clinical Symptoms:  mild dizziness ECG Impression:  No significant ST segment change suggestive of ischemia. Comparison with Prior Nuclear Study: This study is compared with a study of April, 2012.  Overall Impression:  Normal stress nuclear study. There is no scar or ischemia. Wall motion is normal. There is no significant change since the prior study. This is a low risk scan.  LV Ejection Fraction: 82%.  LV Wall Motion:  Normal Wall Motion.  Willa Rough, MD

## 2012-08-05 ENCOUNTER — Telehealth: Payer: Self-pay

## 2012-08-05 DIAGNOSIS — I10 Essential (primary) hypertension: Secondary | ICD-10-CM

## 2012-08-05 MED ORDER — LISINOPRIL 5 MG PO TABS: 5.0000 mg | ORAL_TABLET | Freq: Every day | ORAL | Status: DC

## 2012-08-05 NOTE — Telephone Encounter (Signed)
Received B/P readings from patient Dr.Jordan reviewed he advised to start lisinopril 5 mg daily.Bmet in 1 month.

## 2012-08-13 ENCOUNTER — Other Ambulatory Visit: Payer: Self-pay

## 2012-08-13 ENCOUNTER — Other Ambulatory Visit: Payer: Self-pay | Admitting: Cardiology

## 2012-08-13 MED ORDER — ATORVASTATIN CALCIUM 40 MG PO TABS: 40.0000 mg | ORAL_TABLET | Freq: Every day | ORAL | Status: DC

## 2012-08-13 MED ORDER — CLOPIDOGREL BISULFATE 75 MG PO TABS: 75.0000 mg | ORAL_TABLET | Freq: Every day | ORAL | Status: DC

## 2012-09-02 ENCOUNTER — Other Ambulatory Visit: Payer: Self-pay

## 2012-09-07 ENCOUNTER — Telehealth: Payer: Self-pay | Admitting: Cardiology

## 2012-09-07 ENCOUNTER — Other Ambulatory Visit (INDEPENDENT_AMBULATORY_CARE_PROVIDER_SITE_OTHER): Payer: Medicare Other

## 2012-09-07 ENCOUNTER — Other Ambulatory Visit: Payer: Medicare Other

## 2012-09-07 DIAGNOSIS — I10 Essential (primary) hypertension: Secondary | ICD-10-CM

## 2012-09-07 LAB — BASIC METABOLIC PANEL
CO2: 26 mEq/L (ref 19–32)
Calcium: 8.9 mg/dL (ref 8.4–10.5)
Chloride: 106 mEq/L (ref 96–112)
Creatinine, Ser: 0.8 mg/dL (ref 0.4–1.2)
Glucose, Bld: 91 mg/dL (ref 70–99)
Sodium: 139 mEq/L (ref 135–145)

## 2012-09-07 NOTE — Telephone Encounter (Signed)
Walk In pt Form " Pt Left Envelope for Pamela Gonzales gave to [facility] 09/07/12/KM

## 2012-09-10 ENCOUNTER — Telehealth: Payer: Self-pay | Admitting: Cardiology

## 2012-09-10 NOTE — Telephone Encounter (Signed)
Returned call to patient Dr.Jordan reviewed B/P readings, advised to continue same medications.

## 2012-09-10 NOTE — Telephone Encounter (Signed)
New problem   Update   Pt has recorded 30 days of bp readings and states that due to recent blood work she is showing to be well. She asks that per her own research will she have to stay on the Lisinopril.

## 2012-10-15 ENCOUNTER — Other Ambulatory Visit: Payer: Self-pay

## 2012-10-15 DIAGNOSIS — Z1231 Encounter for screening mammogram for malignant neoplasm of breast: Secondary | ICD-10-CM

## 2012-11-25 ENCOUNTER — Ambulatory Visit
Admission: RE | Admit: 2012-11-25 | Discharge: 2012-11-25 | Disposition: A | Discharge status: Home / Self Care | Payer: Medicare Other | Source: Ambulatory Visit

## 2012-11-25 DIAGNOSIS — Z1231 Encounter for screening mammogram for malignant neoplasm of breast: Secondary | ICD-10-CM

## 2012-12-03 ENCOUNTER — Other Ambulatory Visit: Payer: Self-pay

## 2012-12-07 ENCOUNTER — Other Ambulatory Visit: Payer: Self-pay | Admitting: *Deleted

## 2012-12-07 ENCOUNTER — Telehealth: Payer: Self-pay | Admitting: Nurse Practitioner

## 2012-12-07 DIAGNOSIS — E785 Hyperlipidemia, unspecified: Secondary | ICD-10-CM

## 2012-12-07 NOTE — Telephone Encounter (Signed)
S/w pt is coming in on Thursday for lab work someone from the office already made lab appt. I put in the orders under Dr. Thomasene Lot and pt is aware and agreeable to plan

## 2012-12-07 NOTE — Telephone Encounter (Signed)
New problem:  Pt states she would like blood work on Wednesday morning. Pt does not have any orders in Epic. Pamela Gonzales is a pt of Dr. Swaziland; however she rescheduled for 11/17 with Pamela Gonzales. Please advise and give pt a call if she can come in for fasting labs on Wednesday 11/12.

## 2012-12-07 NOTE — Telephone Encounter (Signed)
She can have a BMET, HPF and Lipid - ordered under Dr. Swaziland.

## 2012-12-10 ENCOUNTER — Other Ambulatory Visit: Payer: Self-pay

## 2012-12-10 ENCOUNTER — Other Ambulatory Visit (INDEPENDENT_AMBULATORY_CARE_PROVIDER_SITE_OTHER): Payer: Medicare Other

## 2012-12-10 DIAGNOSIS — E785 Hyperlipidemia, unspecified: Secondary | ICD-10-CM

## 2012-12-10 DIAGNOSIS — I2510 Atherosclerotic heart disease of native coronary artery without angina pectoris: Secondary | ICD-10-CM

## 2012-12-10 LAB — HEPATIC FUNCTION PANEL
ALT: 19 U/L (ref 0–35)
AST: 29 U/L (ref 0–37)
Alkaline Phosphatase: 43 U/L (ref 39–117)
Bilirubin, Direct: 0 mg/dL (ref 0.0–0.3)
Total Bilirubin: 1.3 mg/dL — ABNORMAL HIGH (ref 0.3–1.2)
Total Protein: 7 g/dL (ref 6.0–8.3)

## 2012-12-10 LAB — BASIC METABOLIC PANEL
CO2: 30 mEq/L (ref 19–32)
Calcium: 9.5 mg/dL (ref 8.4–10.5)
Chloride: 103 mEq/L (ref 96–112)
Potassium: 4 mEq/L (ref 3.5–5.1)

## 2012-12-10 LAB — LIPID PANEL
LDL Cholesterol: 99 mg/dL (ref 0–99)
Total CHOL/HDL Ratio: 3

## 2012-12-11 ENCOUNTER — Ambulatory Visit: Payer: Medicare Other | Admitting: Cardiology

## 2012-12-11 LAB — VITAMIN D 25 HYDROXY (VIT D DEFICIENCY, FRACTURES): Vit D, 25-Hydroxy: 61 ng/mL (ref 30–89)

## 2012-12-14 ENCOUNTER — Ambulatory Visit (INDEPENDENT_AMBULATORY_CARE_PROVIDER_SITE_OTHER): Payer: Medicare Other | Admitting: Nurse Practitioner

## 2012-12-14 ENCOUNTER — Encounter: Payer: Self-pay | Admitting: Nurse Practitioner

## 2012-12-14 VITALS — BP 130/68 | HR 78 | Ht 65.0 in | Wt 190.1 lb

## 2012-12-14 DIAGNOSIS — I2510 Atherosclerotic heart disease of native coronary artery without angina pectoris: Secondary | ICD-10-CM

## 2012-12-14 NOTE — Patient Instructions (Signed)
Stay active  I will send a copy of your labs to Dr. Tenny Craw  See Dr. Swaziland in 6 months  Stay on your current medicines  Call the [facility] office at [phone] if you have any questions, problems or concerns.

## 2012-12-14 NOTE — Progress Notes (Signed)
Pamela Gonzales Date of Birth: 03-21-1930 Medical Record #[id number]  History of Present Illness: Pamela Gonzales is seen back today for a 6 month check. Seen for Dr. Swaziland. She has a history of HLD, Lupus, GERD, OA, RBBB (felt to be benign) and CAD with past stents placed in 2005. She had stenting of the mid LAD and 2nd OM in 2005 with Cypher stents. Last Myoview was in May of 2014 was low risk. She has remained on Plavix.   Comes back today. Here alone. Doing ok. No chest pain or shortness of breath. Says she goes to the gym 6 days a week. Weight unchanged. BP readings from home are ok for the most part. Still with some elevated diastolics. Her readings are all taken before any medicine. Now on low dose ACE. She is quite upset that MyChart will not update her vaccines, colonoscopy, etc. Wanting a DNR signed - does not wish to have resuscitative efforts. Labs were reviewed with her.   Current Outpatient Prescriptions  Medication Sig Dispense Refill  . aspirin 81 MG tablet Take 81 mg by mouth daily.        Marland Kitchen atorvastatin (LIPITOR) 40 MG tablet Take 1 tablet (40 mg total) by mouth daily.  30 tablet  5  . BOOSTRIX 5-2.5-18.5 injection       . Calcium Citrate-Vitamin D (CITRACAL + D PO) Take by mouth.      . clopidogrel (PLAVIX) 75 MG tablet Take 1 tablet (75 mg total) by mouth daily.  30 tablet  5  . folic acid (FOLVITE) 1 MG tablet Take 1 tablet (1 mg total) by mouth daily.  30 tablet  0  . GuaiFENesin (MUCINEX PO) Take by mouth.      Marland Kitchen lisinopril (PRINIVIL,ZESTRIL) 5 MG tablet Take 1 tablet (5 mg total) by mouth daily.  30 tablet  11  . NASONEX 50 MCG/ACT nasal spray 2 sprays by Nasal route Daily.      . pantoprazole (PROTONIX) 40 MG tablet Take 40 mg by mouth daily.       Marland Kitchen ZOSTAVAX 78295 UNT/0.65ML injection        No current facility-administered medications for this visit.    Allergies  Allergen Reactions  . Cephalexin   . Oxycodone     itching  . Penicillins   . Sulfa Drugs Cross  Reactors Other (See Comments)    Skin irritation.    Past Medical History  Diagnosis Date  . Hypercholesteremia   . Lupus   . GERD (gastroesophageal reflux disease)   . Spinal stenosis   . Fibula fracture     right  . Gallstones   . Arthritis   . CAD (coronary artery disease) 05    stents  . RBBB     Past Surgical History  Procedure Laterality Date  . Coronary angioplasty  2005  . Breast lumpectomy  1970  . Abdominal hysterectomy  1975  . Fibula fracture surgery  2011    Right Ankle  . Colonoscopy w/ biopsies    . Cholecystectomy  07/04/2011    Procedure: LAPAROSCOPIC CHOLECYSTECTOMY WITH INTRAOPERATIVE CHOLANGIOGRAM;  Surgeon: Robyne Askew, MD;  Location: [facility] OR;  Service: General;  Laterality: N/A;    History  Smoking status  . Never Smoker   Smokeless tobacco  . Not on file    History  Alcohol Use No    Family History  Problem Relation Age of Onset  . Cancer Father   . Heart attack  Mother   . Heart attack Brother   . Cancer Sister   . Cancer Sister   . Anesthesia problems Neg Hx   . Hypotension Neg Hx   . Malignant hyperthermia Neg Hx   . Pseudochol deficiency Neg Hx     Review of Systems: The review of systems is per the HPI.  All other systems were reviewed and are negative.  Physical Exam: BP 130/68  Pulse 78  Ht 5\' 5"  (1.651 m)  Wt 190 lb 1.9 oz (86.238 kg)  BMI 31.64 kg/m2 Patient is alert and in no acute distress. She is obese. Weight is unchanged. Skin is warm and dry. Color is normal.  HEENT is unremarkable. Normocephalic/atraumatic. PERRL. Sclera are nonicteric. Neck is supple. No masses. No JVD. Lungs are clear. Cardiac exam shows a regular rate and rhythm. Abdomen is soft. Extremities are without edema. Gait and ROM are intact. No gross neurologic deficits noted.  LABORATORY DATA:  Lab Results  Component Value Date   WBC 6.2 06/28/2011   HGB 14.3 06/28/2011   HCT 41.5 06/28/2011   PLT 166 06/28/2011   GLUCOSE 82 12/10/2012   CHOL  177 12/10/2012   TRIG 106.0 12/10/2012   HDL 57.10 12/10/2012   LDLCALC 99 12/10/2012   ALT 19 12/10/2012   AST 29 12/10/2012   NA 139 12/10/2012   K 4.0 12/10/2012   CL 103 12/10/2012   CREATININE 0.9 12/10/2012   BUN 15 12/10/2012   CO2 30 12/10/2012   INR 1.07 08/03/2009   HGBA1C 5.4 05/10/2010    Myoview Impression from May 2014  Exercise Capacity: Lexiscan with no exercise.  BP Response: Normal blood pressure response.  Clinical Symptoms: mild dizziness  ECG Impression: No significant ST segment change suggestive of ischemia.  Comparison with Prior Nuclear Study: This study is compared with a study of April, 2012.  Overall Impression: Normal stress nuclear study. There is no scar or ischemia. Wall motion is normal. There is no significant change since the prior study. This is a low risk scan.  LV Ejection Fraction: 82%. LV Wall Motion: Normal Wall Motion.  Willa Rough, MD  Assessment / Plan: 1. CAD - no chest pain reported - normal Myoview in May. Continue with CV risk factor modification.   2. HTN - BP is ok - I have asked her to vary her times of BP checks. Would leave her on her current regimen for now.  3. HLD - on statin.   Copies of her labs are given to her. She brings in a DNR. I am not really comfortable signing that today - will discuss with Dr. Swaziland. Tentatively see her back in 6 months.   Patient is agreeable to this plan and will call if any problems develop in the interim.   Rosalio Macadamia, RN, ANP-C [facility] [address] [place], [place]  [address]

## 2012-12-15 ENCOUNTER — Telehealth: Payer: Self-pay | Admitting: Cardiology

## 2012-12-15 NOTE — Telephone Encounter (Signed)
Returned call to patient she wanted to know if Dr.Jordan will sign form Do not resuscitate.Stated she lives at [facility] and needs to post on her refrigerator.Will check with Dr.Jordan 12/16/12 and call her back.

## 2012-12-15 NOTE — Telephone Encounter (Signed)
New Problem:  Pt states she would like a call from [place] today after 2 pm.. Pt states she will give more details when [place] calls her.. Please advise

## 2012-12-16 ENCOUNTER — Ambulatory Visit: Payer: Medicare Other | Admitting: Cardiology

## 2012-12-16 NOTE — Telephone Encounter (Signed)
Returned call to patient no answer.LMTC. 

## 2012-12-17 NOTE — Telephone Encounter (Signed)
Follow Up ° °Pt returned call//  °

## 2012-12-18 NOTE — Telephone Encounter (Signed)
Returned call to patient she stated she has changed her mind and does not want Dr.Jordan to sign DNR.

## 2012-12-30 ENCOUNTER — Other Ambulatory Visit (HOSPITAL_COMMUNITY): Payer: Self-pay

## 2012-12-31 ENCOUNTER — Encounter (HOSPITAL_COMMUNITY)
Admission: RE | Admit: 2012-12-31 | Discharge: 2012-12-31 | Disposition: A | Discharge status: Home / Self Care | Payer: Medicare Other | Source: Ambulatory Visit | Attending: Obstetrics and Gynecology | Admitting: Obstetrics and Gynecology

## 2012-12-31 DIAGNOSIS — M810 Age-related osteoporosis without current pathological fracture: Secondary | ICD-10-CM | POA: Insufficient documentation

## 2012-12-31 MED ORDER — ZOLEDRONIC ACID 5 MG/100ML IV SOLN
INTRAVENOUS | Status: AC
  Administered 2012-12-31: 5 mg via INTRAVENOUS
  Filled 2012-12-31: qty 100

## 2012-12-31 MED ORDER — ZOLEDRONIC ACID 5 MG/100ML IV SOLN
5.0000 mg | Freq: Once | INTRAVENOUS | Status: AC
  Administered 2012-12-31: 5 mg via INTRAVENOUS

## 2013-01-08 ENCOUNTER — Telehealth: Payer: Self-pay

## 2013-01-08 NOTE — Telephone Encounter (Signed)
Opened in error

## 2013-03-02 ENCOUNTER — Encounter: Payer: Self-pay | Admitting: Nurse Practitioner

## 2013-04-04 ENCOUNTER — Emergency Department (HOSPITAL_BASED_OUTPATIENT_CLINIC_OR_DEPARTMENT_OTHER)
Admission: EM | Admit: 2013-04-04 | Discharge: 2013-04-05 | Disposition: A | Discharge status: Home / Self Care | Payer: Medicare Other | Attending: Emergency Medicine | Admitting: Emergency Medicine

## 2013-04-04 ENCOUNTER — Encounter (HOSPITAL_BASED_OUTPATIENT_CLINIC_OR_DEPARTMENT_OTHER): Payer: Self-pay | Admitting: Emergency Medicine

## 2013-04-04 DIAGNOSIS — Z9089 Acquired absence of other organs: Secondary | ICD-10-CM | POA: Insufficient documentation

## 2013-04-04 DIAGNOSIS — R110 Nausea: Secondary | ICD-10-CM | POA: Insufficient documentation

## 2013-04-04 DIAGNOSIS — Z880 Allergy status to penicillin: Secondary | ICD-10-CM | POA: Insufficient documentation

## 2013-04-04 DIAGNOSIS — Z8781 Personal history of (healed) traumatic fracture: Secondary | ICD-10-CM | POA: Insufficient documentation

## 2013-04-04 DIAGNOSIS — Z79899 Other long term (current) drug therapy: Secondary | ICD-10-CM | POA: Insufficient documentation

## 2013-04-04 DIAGNOSIS — Z7982 Long term (current) use of aspirin: Secondary | ICD-10-CM | POA: Insufficient documentation

## 2013-04-04 DIAGNOSIS — Z90710 Acquired absence of both cervix and uterus: Secondary | ICD-10-CM | POA: Insufficient documentation

## 2013-04-04 DIAGNOSIS — I2510 Atherosclerotic heart disease of native coronary artery without angina pectoris: Secondary | ICD-10-CM | POA: Insufficient documentation

## 2013-04-04 DIAGNOSIS — Z9861 Coronary angioplasty status: Secondary | ICD-10-CM | POA: Insufficient documentation

## 2013-04-04 DIAGNOSIS — K219 Gastro-esophageal reflux disease without esophagitis: Secondary | ICD-10-CM | POA: Insufficient documentation

## 2013-04-04 DIAGNOSIS — N390 Urinary tract infection, site not specified: Secondary | ICD-10-CM | POA: Insufficient documentation

## 2013-04-04 DIAGNOSIS — E7800 Pure hypercholesterolemia, unspecified: Secondary | ICD-10-CM | POA: Insufficient documentation

## 2013-04-04 DIAGNOSIS — Z9889 Other specified postprocedural states: Secondary | ICD-10-CM | POA: Insufficient documentation

## 2013-04-04 DIAGNOSIS — M329 Systemic lupus erythematosus, unspecified: Secondary | ICD-10-CM | POA: Insufficient documentation

## 2013-04-04 DIAGNOSIS — M129 Arthropathy, unspecified: Secondary | ICD-10-CM | POA: Insufficient documentation

## 2013-04-04 DIAGNOSIS — R6883 Chills (without fever): Secondary | ICD-10-CM | POA: Insufficient documentation

## 2013-04-04 DIAGNOSIS — Z7902 Long term (current) use of antithrombotics/antiplatelets: Secondary | ICD-10-CM | POA: Insufficient documentation

## 2013-04-04 LAB — URINALYSIS, ROUTINE W REFLEX MICROSCOPIC
Bilirubin Urine: NEGATIVE
GLUCOSE, UA: NEGATIVE mg/dL
Ketones, ur: NEGATIVE mg/dL
Nitrite: NEGATIVE
PH: 5.5 (ref 5.0–8.0)
PROTEIN: NEGATIVE mg/dL
Specific Gravity, Urine: 1.021 (ref 1.005–1.030)
Urobilinogen, UA: 0.2 mg/dL (ref 0.0–1.0)

## 2013-04-04 LAB — BASIC METABOLIC PANEL
BUN: 20 mg/dL (ref 6–23)
CALCIUM: 10 mg/dL (ref 8.4–10.5)
CHLORIDE: 103 meq/L (ref 96–112)
CO2: 28 mEq/L (ref 19–32)
CREATININE: 1 mg/dL (ref 0.50–1.10)
GFR calc Af Amer: 60 mL/min — ABNORMAL LOW (ref >90)
GFR calc non Af Amer: 52 mL/min — ABNORMAL LOW (ref >90)
GLUCOSE: 119 mg/dL — AB (ref 70–99)
Potassium: 4.4 mEq/L (ref 3.7–5.3)
Sodium: 144 mEq/L (ref 137–147)

## 2013-04-04 LAB — CBC WITH DIFFERENTIAL/PLATELET
BASOS ABS: 0 10*3/uL (ref 0.0–0.1)
Basophils Relative: 0 % (ref 0–1)
EOS PCT: 1 % (ref 0–5)
Eosinophils Absolute: 0.1 10*3/uL (ref 0.0–0.7)
HCT: 41.3 % (ref 36.0–46.0)
HEMOGLOBIN: 14 g/dL (ref 12.0–15.0)
LYMPHS ABS: 1 10*3/uL (ref 0.7–4.0)
LYMPHS PCT: 8 % — AB (ref 12–46)
MCH: 32.3 pg (ref 26.0–34.0)
MCHC: 33.9 g/dL (ref 30.0–36.0)
MCV: 95.4 fL (ref 78.0–100.0)
MONOS PCT: 9 % (ref 3–12)
Monocytes Absolute: 1.1 10*3/uL — ABNORMAL HIGH (ref 0.1–1.0)
NEUTROS ABS: 10.5 10*3/uL — AB (ref 1.7–7.7)
Neutrophils Relative %: 82 % — ABNORMAL HIGH (ref 43–77)
Platelets: 171 10*3/uL (ref 150–400)
RBC: 4.33 MIL/uL (ref 3.87–5.11)
RDW: 13.4 % (ref 11.5–15.5)
WBC: 12.8 10*3/uL — AB (ref 4.0–10.5)

## 2013-04-04 LAB — URINE MICROSCOPIC-ADD ON

## 2013-04-04 MED ORDER — CIPROFLOXACIN HCL 500 MG PO TABS: 500.0000 mg | ORAL_TABLET | Freq: Two times a day (BID) | ORAL | Status: DC

## 2013-04-04 MED ORDER — CIPROFLOXACIN IN D5W 400 MG/200ML IV SOLN
400.0000 mg | Freq: Once | INTRAVENOUS | Status: AC
  Administered 2013-04-04: 400 mg via INTRAVENOUS
  Filled 2013-04-04: qty 200

## 2013-04-04 NOTE — Discharge Instructions (Signed)
Followup with [facility] given a call in the morning. Take your antibiotic as directed. Would expect you to be improving significantly in 24 hours. Return for any newer worse symptoms.

## 2013-04-04 NOTE — ED Notes (Signed)
Patient gave urine sample, sample on hold in lab pending order.

## 2013-04-04 NOTE — ED Notes (Signed)
Pt reports recent testing for uro-dynamics and received 1 dose cipro following tests to prevent UTI pt currently having frequency and pain with voiding and fears she has a bladder. Urine specimen obtained.

## 2013-04-04 NOTE — ED Provider Notes (Signed)
CSN: [id number]     Arrival date & time 04/04/13  1843 History   This chart was scribed for Mervin Kung, MD by Era Bumpers, ED scribe. This patient was seen in room MH05/MH05 and the patient's care was started at 1843.  Chief Complaint  Patient presents with  . Flank Pain   Patient is a 80 y.o. female presenting with flank pain. The history is provided by the patient. No language interpreter was used.  Flank Pain This is a new problem. The current episode started 6 to 12 hours ago. The problem occurs constantly. The problem has not changed since onset.Associated symptoms include abdominal pain. Pertinent negatives include no chest pain, no headaches and no shortness of breath. Nothing aggravates the symptoms. Nothing relieves the symptoms. She has tried nothing for the symptoms.   HPI Comments: Pamela Gonzales is a 80 y.o. female who presents to the Emergency Department complaining of urinary incontinence for a few weeks but over the past few days has developed with suprapubic abdominal pressure, urgency, lower back pain and sensation of urinary retention. She has had recent workups for ongoing urinary incontinence problem and unsure of what her bladder problem is at this point. She had a probe of her bladder as well a urodynamics study 4 days ago. She had one dose of Cipro after her studies "in case I have a UTI". She reports that for the past few days, she has developed urgency and sensation of urinary retention b/c feeling unable to completely empty her bladder. She reports associated nausea, chills, lower back pain and abdominal discomfort. She denies any CP, sob, lower leg swelling, skin rash, hemophilia, HA, visual disturbances or cough/cold.   Past Medical History  Diagnosis Date  . Hypercholesteremia   . Lupus   . GERD (gastroesophageal reflux disease)   . Spinal stenosis   . Fibula fracture     right  . Gallstones   . Arthritis   . CAD (coronary artery disease) 05    stents  .  RBBB    Past Surgical History  Procedure Laterality Date  . Coronary angioplasty  2005  . Breast lumpectomy  1970  . Abdominal hysterectomy  1975  . Fibula fracture surgery  2011    Right Ankle  . Colonoscopy w/ biopsies    . Cholecystectomy  07/04/2011    Procedure: LAPAROSCOPIC CHOLECYSTECTOMY WITH INTRAOPERATIVE CHOLANGIOGRAM;  Surgeon: Merrie Roof, MD;  Location: [facility] OR;  Service: General;  Laterality: N/A;   Family History  Problem Relation Age of Onset  . Cancer Father   . Heart attack Mother   . Heart attack Brother   . Cancer Sister   . Cancer Sister   . Anesthesia problems Neg Hx   . Hypotension Neg Hx   . Malignant hyperthermia Neg Hx   . Pseudochol deficiency Neg Hx    History  Substance Use Topics  . Smoking status: Never Smoker   . Smokeless tobacco: Not on file  . Alcohol Use: No   OB History   Grav Para Term Preterm Abortions TAB SAB Ect Mult Living                 Review of Systems  Constitutional: Positive for chills. Negative for fatigue.  HENT: Negative for rhinorrhea and sore throat.   Eyes: Negative for visual disturbance.  Respiratory: Negative for cough and shortness of breath.   Cardiovascular: Negative for chest pain and leg swelling.  Gastrointestinal: Positive for nausea  and abdominal pain. Negative for vomiting and diarrhea.  Genitourinary: Positive for urgency and flank pain.  Musculoskeletal: Negative for back pain.  Skin: Negative for rash.  Neurological: Negative for headaches.  Hematological: Does not bruise/bleed easily.  Psychiatric/Behavioral: Negative for confusion.  All other systems reviewed and are negative.   Allergies  Cephalexin; Oxycodone; Penicillins; and Sulfa drugs cross reactors  Home Medications   Current Outpatient Rx  Name  Route  Sig  Dispense  Refill  . aspirin 81 MG tablet   Oral   Take 81 mg by mouth daily.           Marland Kitchen atorvastatin (LIPITOR) 40 MG tablet   Oral   Take 1 tablet (40 mg total) by  mouth daily.   30 tablet   5   . BOOSTRIX 5-2.5-18.5 injection               . Calcium Citrate-Vitamin D (CITRACAL + D PO)   Oral   Take by mouth.         . ciprofloxacin (CIPRO) 500 MG tablet   Oral   Take 1 tablet (500 mg total) by mouth 2 (two) times daily.   14 tablet   0   . clopidogrel (PLAVIX) 75 MG tablet   Oral   Take 1 tablet (75 mg total) by mouth daily.   30 tablet   5   . folic acid (FOLVITE) 1 MG tablet   Oral   Take 1 tablet (1 mg total) by mouth daily.   30 tablet   0   . GuaiFENesin (MUCINEX PO)   Oral   Take by mouth.         Marland Kitchen lisinopril (PRINIVIL,ZESTRIL) 5 MG tablet   Oral   Take 1 tablet (5 mg total) by mouth daily.   30 tablet   11   . NASONEX 50 MCG/ACT nasal spray   Nasal   2 sprays by Nasal route Daily.         . pantoprazole (PROTONIX) 40 MG tablet   Oral   Take 40 mg by mouth daily.          Marland Kitchen ZOSTAVAX 25427 UNT/0.65ML injection                Triage Vitals: BP 182/81  Pulse 83  Temp(Src) 97.7 F (36.5 C) (Oral)  Resp 20  Ht 5\' 4"  (1.626 m)  Wt 190 lb (86.183 kg)  BMI 32.60 kg/m2  SpO2 100%  Physical Exam  Nursing note and vitals reviewed. Constitutional: She is oriented to person, place, and time. She appears well-developed and well-nourished. No distress.  HENT:  Head: Normocephalic and atraumatic.  Eyes: Conjunctivae and EOM are normal. Right eye exhibits no discharge. Left eye exhibits no discharge.  Neck: Normal range of motion.  Cardiovascular: Normal rate, regular rhythm and normal heart sounds.   No murmur heard. Pulmonary/Chest: Effort normal and breath sounds normal. No respiratory distress. She has no wheezes. She has no rales.  Abdominal: Soft. Bowel sounds are normal. She exhibits no distension. There is no tenderness.  Non distended lower abdomen  Musculoskeletal: Normal range of motion. She exhibits no edema.  No ankle swelling  Neurological: She is alert and oriented to person, place,  and time. No cranial nerve deficit.  Neuro grossly intact  Skin: Skin is warm and dry.  Psychiatric: She has a normal mood and affect. Thought content normal.    ED Course  Procedures (including critical care time)  DIAGNOSTIC STUDIES: Oxygen Saturation is 100% on room air, normal by my interpretation.    COORDINATION OF CARE: At 920 PM Discussed treatment plan with patient which includes UA, bladder scan, blood work. Patient agrees.   Medications  ciprofloxacin (CIPRO) IVPB 400 mg (not administered)   Results for orders placed during the hospital encounter of 04/04/13  URINALYSIS, ROUTINE W REFLEX MICROSCOPIC      Result Value Ref Range   Color, Urine YELLOW  YELLOW   APPearance CLOUDY (*) CLEAR   Specific Gravity, Urine 1.021  1.005 - 1.030   pH 5.5  5.0 - 8.0   Glucose, UA NEGATIVE  NEGATIVE mg/dL   Hgb urine dipstick LARGE (*) NEGATIVE   Bilirubin Urine NEGATIVE  NEGATIVE   Ketones, ur NEGATIVE  NEGATIVE mg/dL   Protein, ur NEGATIVE  NEGATIVE mg/dL   Urobilinogen, UA 0.2  0.0 - 1.0 mg/dL   Nitrite NEGATIVE  NEGATIVE   Leukocytes, UA SMALL (*) NEGATIVE  CBC WITH DIFFERENTIAL      Result Value Ref Range   WBC 12.8 (*) 4.0 - 10.5 K/uL   RBC 4.33  3.87 - 5.11 MIL/uL   Hemoglobin 14.0  12.0 - 15.0 g/dL   HCT 41.3  36.0 - 46.0 %   MCV 95.4  78.0 - 100.0 fL   MCH 32.3  26.0 - 34.0 pg   MCHC 33.9  30.0 - 36.0 g/dL   RDW 13.4  11.5 - 15.5 %   Platelets 171  150 - 400 K/uL   Neutrophils Relative % 82 (*) 43 - 77 %   Neutro Abs 10.5 (*) 1.7 - 7.7 K/uL   Lymphocytes Relative 8 (*) 12 - 46 %   Lymphs Abs 1.0  0.7 - 4.0 K/uL   Monocytes Relative 9  3 - 12 %   Monocytes Absolute 1.1 (*) 0.1 - 1.0 K/uL   Eosinophils Relative 1  0 - 5 %   Eosinophils Absolute 0.1  0.0 - 0.7 K/uL   Basophils Relative 0  0 - 1 %   Basophils Absolute 0.0  0.0 - 0.1 K/uL  BASIC METABOLIC PANEL      Result Value Ref Range   Sodium 144  137 - 147 mEq/L   Potassium 4.4  3.7 - 5.3 mEq/L   Chloride  103  96 - 112 mEq/L   CO2 28  19 - 32 mEq/L   Glucose, Bld 119 (*) 70 - 99 mg/dL   BUN 20  6 - 23 mg/dL   Creatinine, Ser 1.00  0.50 - 1.10 mg/dL   Calcium 10.0  8.4 - 10.5 mg/dL   GFR calc non Af Amer 52 (*) >90 mL/min   GFR calc Af Amer 60 (*) >90 mL/min  URINE MICROSCOPIC-ADD ON      Result Value Ref Range   Squamous Epithelial / LPF RARE  RARE   WBC, UA 3-6  <3 WBC/hpf   RBC / HPF 21-50  <3 RBC/hpf   Bacteria, UA MANY (*) RARE   No results found.   EKG Interpretation None      MDM   Final diagnoses:  UTI (lower urinary tract infection)   Patient with good flow rate voiding here. No sniffing evidence of urinary retention. Patient's symptoms seem to be consistent with developing urinary tract infection. No evidence of significant urinary retention. Patient given IV Cipro here. Patient will be continued on oral Cipro and followup with her urologist at [facility] tomorrow.    I personally  performed the services described in this documentation, which was scribed in my presence. The recorded information has been reviewed and is accurate.      Mervin Kung, MD 04/04/13 [phone]

## 2013-04-04 NOTE — ED Notes (Signed)
Patient has seen a urologist for testing on urinary incontinence. She states she had a certain urodynamic study test and after the test began having painful bladder, not feeling like she can go to the restroom and she states it has been worsening.

## 2013-04-12 ENCOUNTER — Emergency Department (HOSPITAL_BASED_OUTPATIENT_CLINIC_OR_DEPARTMENT_OTHER)
Admission: EM | Admit: 2013-04-12 | Discharge: 2013-04-12 | Disposition: A | Discharge status: Home / Self Care | Payer: Medicare Other | Attending: Emergency Medicine | Admitting: Emergency Medicine

## 2013-04-12 ENCOUNTER — Encounter (HOSPITAL_BASED_OUTPATIENT_CLINIC_OR_DEPARTMENT_OTHER): Payer: Self-pay | Admitting: Emergency Medicine

## 2013-04-12 DIAGNOSIS — R3129 Other microscopic hematuria: Secondary | ICD-10-CM | POA: Insufficient documentation

## 2013-04-12 DIAGNOSIS — K219 Gastro-esophageal reflux disease without esophagitis: Secondary | ICD-10-CM | POA: Insufficient documentation

## 2013-04-12 DIAGNOSIS — N3289 Other specified disorders of bladder: Secondary | ICD-10-CM

## 2013-04-12 DIAGNOSIS — E7800 Pure hypercholesterolemia, unspecified: Secondary | ICD-10-CM | POA: Insufficient documentation

## 2013-04-12 DIAGNOSIS — Z79899 Other long term (current) drug therapy: Secondary | ICD-10-CM | POA: Insufficient documentation

## 2013-04-12 DIAGNOSIS — Z87440 Personal history of urinary (tract) infections: Secondary | ICD-10-CM | POA: Insufficient documentation

## 2013-04-12 DIAGNOSIS — Z8781 Personal history of (healed) traumatic fracture: Secondary | ICD-10-CM | POA: Insufficient documentation

## 2013-04-12 DIAGNOSIS — Z7902 Long term (current) use of antithrombotics/antiplatelets: Secondary | ICD-10-CM | POA: Insufficient documentation

## 2013-04-12 DIAGNOSIS — M129 Arthropathy, unspecified: Secondary | ICD-10-CM | POA: Insufficient documentation

## 2013-04-12 DIAGNOSIS — Z7982 Long term (current) use of aspirin: Secondary | ICD-10-CM | POA: Insufficient documentation

## 2013-04-12 DIAGNOSIS — Z9861 Coronary angioplasty status: Secondary | ICD-10-CM | POA: Insufficient documentation

## 2013-04-12 DIAGNOSIS — Z8739 Personal history of other diseases of the musculoskeletal system and connective tissue: Secondary | ICD-10-CM | POA: Insufficient documentation

## 2013-04-12 DIAGNOSIS — Z880 Allergy status to penicillin: Secondary | ICD-10-CM | POA: Insufficient documentation

## 2013-04-12 DIAGNOSIS — I2510 Atherosclerotic heart disease of native coronary artery without angina pectoris: Secondary | ICD-10-CM | POA: Insufficient documentation

## 2013-04-12 LAB — URINE MICROSCOPIC-ADD ON

## 2013-04-12 LAB — BASIC METABOLIC PANEL
BUN: 22 mg/dL (ref 6–23)
CALCIUM: 9.2 mg/dL (ref 8.4–10.5)
CHLORIDE: 104 meq/L (ref 96–112)
CO2: 24 mEq/L (ref 19–32)
CREATININE: 1.3 mg/dL — AB (ref 0.50–1.10)
GFR calc non Af Amer: 38 mL/min — ABNORMAL LOW (ref >90)
GFR, EST AFRICAN AMERICAN: 44 mL/min — AB (ref >90)
Glucose, Bld: 135 mg/dL — ABNORMAL HIGH (ref 70–99)
Potassium: 4.2 mEq/L (ref 3.7–5.3)
SODIUM: 140 meq/L (ref 137–147)

## 2013-04-12 LAB — URINALYSIS, ROUTINE W REFLEX MICROSCOPIC
Bilirubin Urine: NEGATIVE
GLUCOSE, UA: NEGATIVE mg/dL
Ketones, ur: NEGATIVE mg/dL
LEUKOCYTES UA: NEGATIVE
NITRITE: NEGATIVE
PH: 6 (ref 5.0–8.0)
Protein, ur: NEGATIVE mg/dL
Specific Gravity, Urine: 1.028 (ref 1.005–1.030)
Urobilinogen, UA: 0.2 mg/dL (ref 0.0–1.0)

## 2013-04-12 MED ORDER — PHENAZOPYRIDINE HCL 100 MG PO TABS
200.0000 mg | ORAL_TABLET | Freq: Once | ORAL | Status: AC
  Administered 2013-04-12: 200 mg via ORAL
  Filled 2013-04-12: qty 2

## 2013-04-12 MED ORDER — KETOROLAC TROMETHAMINE 60 MG/2ML IM SOLN
60.0000 mg | Freq: Once | INTRAMUSCULAR | Status: AC
  Administered 2013-04-12: 60 mg via INTRAMUSCULAR
  Filled 2013-04-12: qty 2

## 2013-04-12 MED ORDER — ONDANSETRON 8 MG PO TBDP
8.0000 mg | ORAL_TABLET | Freq: Once | ORAL | Status: AC
  Administered 2013-04-12: 8 mg via ORAL
  Filled 2013-04-12: qty 1

## 2013-04-12 NOTE — ED Notes (Signed)
Patient gave urine sample, all she could give. Patient wanted water to drink, I advised none till Dr. Aurora Mask her. She stated she already took large drink of water in waiting room.

## 2013-04-12 NOTE — ED Notes (Signed)
C/o lower back pain, bladder pressure and "trickling of urine" that has been on and off times one week. Has been treated for a UTI this week with Cipro. Denies any diarrhea. Has taken 2 tylenol pta. States she has been nauseated. States she has an appointment with her urologist today for urinary incontinence. Denies any fevers.

## 2013-04-12 NOTE — ED Provider Notes (Signed)
CSN: [id number]     Arrival date & time 04/12/13  5852 History   First MD Initiated Contact with Patient 04/12/13 [phone]     No chief complaint on file.    (Consider location/radiation/quality/duration/timing/severity/associated sxs/prior Treatment) Patient is a 80 y.o. female presenting with frequency. The history is provided by the patient.  Urinary Frequency This is a new problem. The current episode started more than 1 week ago. The problem occurs constantly. The problem has not changed since onset.Pertinent negatives include no chest pain, no headaches and no shortness of breath. Nothing aggravates the symptoms. Nothing relieves the symptoms. She has tried acetaminophen (and cipro) for the symptoms. The treatment provided no relief.  The patient reports low back pain and sensation of needing to void frequently with some incontinence. The patient also reports and feels she is only having a scant amount of urine with each void. She feels as if there is residual urine post void.  Was diagnosed with UTI 1 week ago in the ED and started on cipro which she reports competing.  In the past 24 hours has had pressure and lower back pain again that doubled her over.  She is due to be seen by Dr. Matilde Sprang of urology today at 1130 to discuss results of her recent urologic procedures.   Past Medical History  Diagnosis Date  . Hypercholesteremia   . Lupus   . GERD (gastroesophageal reflux disease)   . Spinal stenosis   . Fibula fracture     right  . Gallstones   . Arthritis   . CAD (coronary artery disease) 05    stents  . RBBB    Past Surgical History  Procedure Laterality Date  . Coronary angioplasty  2005  . Breast lumpectomy  1970  . Abdominal hysterectomy  1975  . Fibula fracture surgery  2011    Right Ankle  . Colonoscopy w/ biopsies    . Cholecystectomy  07/04/2011    Procedure: LAPAROSCOPIC CHOLECYSTECTOMY WITH INTRAOPERATIVE CHOLANGIOGRAM;  Surgeon: Merrie Roof, MD;  Location: [facility]  OR;  Service: General;  Laterality: N/A;   Family History  Problem Relation Age of Onset  . Cancer Father   . Heart attack Mother   . Heart attack Brother   . Cancer Sister   . Cancer Sister   . Anesthesia problems Neg Hx   . Hypotension Neg Hx   . Malignant hyperthermia Neg Hx   . Pseudochol deficiency Neg Hx    History  Substance Use Topics  . Smoking status: Never Smoker   . Smokeless tobacco: Not on file  . Alcohol Use: No   OB History   Grav Para Term Preterm Abortions TAB SAB Ect Mult Living                 Review of Systems  Constitutional: Negative for fever.  Respiratory: Negative for shortness of breath.   Cardiovascular: Negative for chest pain.  Genitourinary: Positive for frequency. Negative for hematuria.  Neurological: Negative for headaches.  All other systems reviewed and are negative.      Allergies  Cephalexin; Oxycodone; Penicillins; and Sulfa drugs cross reactors  Home Medications   Current Outpatient Rx  Name  Route  Sig  Dispense  Refill  . aspirin 81 MG tablet   Oral   Take 81 mg by mouth daily.           Marland Kitchen atorvastatin (LIPITOR) 40 MG tablet   Oral   Take 1 tablet (  40 mg total) by mouth daily.   30 tablet   5   . BOOSTRIX 5-2.5-18.5 injection               . Calcium Citrate-Vitamin D (CITRACAL + D PO)   Oral   Take by mouth.         . ciprofloxacin (CIPRO) 500 MG tablet   Oral   Take 1 tablet (500 mg total) by mouth 2 (two) times daily.   14 tablet   0   . clopidogrel (PLAVIX) 75 MG tablet   Oral   Take 1 tablet (75 mg total) by mouth daily.   30 tablet   5   . folic acid (FOLVITE) 1 MG tablet   Oral   Take 1 tablet (1 mg total) by mouth daily.   30 tablet   0   . GuaiFENesin (MUCINEX PO)   Oral   Take by mouth.         Marland Kitchen lisinopril (PRINIVIL,ZESTRIL) 5 MG tablet   Oral   Take 1 tablet (5 mg total) by mouth daily.   30 tablet   11   . NASONEX 50 MCG/ACT nasal spray   Nasal   2 sprays by Nasal  route Daily.         . pantoprazole (PROTONIX) 40 MG tablet   Oral   Take 40 mg by mouth daily.          Marland Kitchen ZOSTAVAX 28413 UNT/0.65ML injection                BP 179/78  Pulse 90  Temp(Src) 97.9 F (36.6 C) (Oral)  Resp 24  Ht 5\' 4"  (1.626 m)  Wt 190 lb (86.183 kg)  BMI 32.60 kg/m2  SpO2 97% Physical Exam  Nursing note and vitals reviewed. Constitutional: She is oriented to person, place, and time. She appears well-developed and well-nourished. No distress.  HENT:  Head: Normocephalic and atraumatic.  Mouth/Throat: Oropharynx is clear and moist.  Eyes: Conjunctivae are normal. Pupils are equal, round, and reactive to light.  Neck: Normal range of motion. Neck supple.  Cardiovascular: Normal rate, regular rhythm and intact distal pulses.   Pulmonary/Chest: Effort normal and breath sounds normal. She has no wheezes. She has no rales.  Abdominal: Soft. Bowel sounds are normal. She exhibits no distension and no mass. There is no tenderness. There is no rebound and no guarding.  Musculoskeletal: Normal range of motion.  Neurological: She is alert and oriented to person, place, and time.  Skin: Skin is warm and dry.  Psychiatric: She has a normal mood and affect.    ED Course  Procedures (including critical care time) Labs Review Labs Reviewed  URINALYSIS, ROUTINE W REFLEX MICROSCOPIC   Imaging Review No results found.   EKG Interpretation None      MDM  Bladder spasm likely secondary to recent instrumentation   Patient has an appointment this am at 1130 with her urologist Dr. Matilde Sprang.  Medications  ondansetron (ZOFRAN-ODT) disintegrating tablet 8 mg (8 mg Oral Given 04/12/13 0335)  ketorolac (TORADOL) injection 60 mg (60 mg Intramuscular Given 04/12/13 0336)  phenazopyridine (PYRIDIUM) tablet 200 mg (200 mg Oral Given 04/12/13 0425)   EDP discussed the option of CT scan to exclude kidney stone in the presence of persistent hematuria but patient declines  at this time stating she would like to discuss this with Dr. Matilde Sprang.   She has been made pain free with toradol and we will give a  dose of pyridium prior to discharge.  She is encouraged to hydrate with water.    She expresses gratitude for her care.      Carlisle Beers, MD 04/12/13 [phone]

## 2013-04-12 NOTE — ED Notes (Signed)
Pt bladder scanned and 102ml volume noted. MD aware.

## 2013-04-29 ENCOUNTER — Encounter: Payer: Self-pay | Admitting: Sports Medicine

## 2013-04-29 ENCOUNTER — Ambulatory Visit (INDEPENDENT_AMBULATORY_CARE_PROVIDER_SITE_OTHER): Payer: 59 | Admitting: Sports Medicine

## 2013-04-29 VITALS — BP 121/77 | HR 75 | Ht 64.0 in | Wt 190.0 lb

## 2013-04-29 DIAGNOSIS — M4800 Spinal stenosis, site unspecified: Secondary | ICD-10-CM

## 2013-04-29 NOTE — Patient Instructions (Signed)
Basic exercise series  Continue warm up Continue Hamstring stretch seated  Single knee to chest Single knee to opposite shoulder Double knee to chest  Do these 3 exercises for 3 breaths in and out but only 5 repeats  Pelvic tilt lying - 3 repeats of 3 breaths ( belly button to spine!)  Shoulder bridge - 3 repeats of 3 breaths each side  Angry CAT do 5 repeats - 1 good breath in and out Do 5 repeats of down dog - 1 good breath in and out  This series you need 4 times per week to stabilize back  Keep up Wall push ups  In Pool do circular sessions  Keep up semi recumbent elliptical Keep up upper back/ lat pull down/ chest press/ arm crank  leg press do not exceed 50 pounds  Physical Therapy Consult  Please evaluate the ongoing exercise program and see once weekly for 4 weeks My focus to protect her lumbar spinal stenosis and anterolisthesis is the following:  I want you to stabilize your core muscles I want you to work on abdominal and oblique strength work I wand you to avoid back extension because of your anterolisthesis  Back and upper body strength exercises you should have back supported in a chair  Return to see me in 2 months

## 2013-04-29 NOTE — Progress Notes (Signed)
Patient ID: Pamela Gonzales, female   DOB: 1930-12-06, 80 y.o.   MRN: [id number]  This pleasant older lady lives at [facility] in assisted living She was referred by Dr. Melinda Crutch  History of chronic hamstring tightness bilaterally She now has a dull feeling in her left foot has been diagnosed as peripheral neuropathy  In assisted living she is a very active exercise program She has a history of having 2 stents placed and tries to stay active as a part of her rehabilitation  Medications are reviewed and are as listed  History of her hamstring tightness started 3 years ago Prior to that she has been followed by Dr. Amil Amen / take 8 years of Plaquenil for suspected lupus Stopped Plaquenil secondary to ocular side effects She never developed other full manifestations of lupus  3 years ago she was evaluated by Dr. Sherwood Gambler MRI from January 2012 revealed significant facet joint disease with effusions She had moderately severe central canal stenosis This starts at L3-L4 where it is mild but becomes more severe at L4-5 and L5-S1 She has anterolisthesis of L5 on S1 In addition she had foraminal narrowing  She would like to stay on an active exercise program and wonders if some of the exercises are not good for her hamstring tightness and back issues She does water exercise classes along with classes in the gym almost everyday  Physical exam Elderly white female who looks younger than her stated age BP 121/77  Pulse 75  Ht 5\' 4"  (1.626 m)  Wt 190 lb (86.183 kg)  BMI 32.60 kg/m2  She walks with minimal limp and has good balance while walking She can do toe walk, heel walk With support she can balance standing on the outside of her foot on 1 leg  Back shows some anterior tilt in the lumbar area and some increased kyphosis Back motion is good in extension flexion and lateral bending  Hip range of motion is normal SI joints move Straight leg raises normal at 80 bilaterally  Hip  abduction and flexion strength are both excellent Quad strength and hamstring strength are both good

## 2013-04-29 NOTE — Assessment & Plan Note (Signed)
This has. been established with MRI / January of 2012  Over all she is on an excellent fitness program Because of her anterolisthesis:/ Stop back extension Work on a core exercise series-see. the information and patient education  Referred to physical therapy to help her establish this  I think she should keep up with the other exercise routines for her upper body and aerobic conditioning  Once she has done physical therapy and followed the regimen that I recommended for 2 months I would like to see her to see if she gets less hamstring pain

## 2013-06-03 ENCOUNTER — Telehealth: Payer: Self-pay | Admitting: Cardiology

## 2013-06-03 NOTE — Telephone Encounter (Signed)
New message     Have an appt soon.  Will Dr Martinique want pt to come in a few days early to do blood work?

## 2013-06-03 NOTE — Telephone Encounter (Signed)
Spoke with pt. She called to see if she was supposed to come in for lab work next week before her appointment with Dr Martinique on 5/18. Told pt there is no appointment for her to come in for lab work per last OV, and pt e mail, and lab schedule.  Advised pt to come to scheduled appt on 5/18.  Pt verbalized understanding and pleased with call back.

## 2013-06-14 ENCOUNTER — Ambulatory Visit (INDEPENDENT_AMBULATORY_CARE_PROVIDER_SITE_OTHER): Payer: 59 | Admitting: Cardiology

## 2013-06-14 ENCOUNTER — Encounter: Payer: Self-pay | Admitting: Cardiology

## 2013-06-14 VITALS — BP 116/71 | HR 80 | Ht 64.0 in | Wt 186.1 lb

## 2013-06-14 DIAGNOSIS — I4510 Unspecified right bundle-branch block: Secondary | ICD-10-CM

## 2013-06-14 DIAGNOSIS — E7800 Pure hypercholesterolemia, unspecified: Secondary | ICD-10-CM

## 2013-06-14 DIAGNOSIS — I2510 Atherosclerotic heart disease of native coronary artery without angina pectoris: Secondary | ICD-10-CM

## 2013-06-14 MED ORDER — PANTOPRAZOLE SODIUM 40 MG PO TBEC: 40.0000 mg | DELAYED_RELEASE_TABLET | Freq: Two times a day (BID) | ORAL | Status: DC

## 2013-06-14 NOTE — Progress Notes (Signed)
Pamela Gonzales Date of Birth: [birth year] Medical Record #[id number]  History of Present Illness: Pamela Gonzales is seen back today for a follow up visit. She has a history of HLD, Lupus, GERD, OA, RBBB (felt to be benign) and CAD with past stents placed in 2005. She had stenting of the mid LAD and 2nd OM in 2005 with Cypher stents. Last Myoview was in May of 2014 was low risk. She has remained on Plavix.   Since her last visit she has done well from a cardiac standpoint without chest pain. She goes to the gym 6 days/week. She has had a number of noncardiac problems including bronchitis in Dec/Jan. She developed a salivary stone that is improving. She has urinary incontinence and has been intolerant to [employer] or [employer]. She developed a kidney stone. About 2-3 weeks ago she developed acute asthmatic bronchitis and required breathing treatments and antibiotics.   Current Outpatient Prescriptions  Medication Sig Dispense Refill  . aspirin 81 MG tablet Take 81 mg by mouth daily.        Marland Kitchen atorvastatin (LIPITOR) 40 MG tablet Take 1 tablet (40 mg total) by mouth daily.  30 tablet  5  . BOOSTRIX 5-2.5-18.5 injection       . Calcium Citrate-Vitamin D (CITRACAL + D PO) Take by mouth.      . clopidogrel (PLAVIX) 75 MG tablet Take 1 tablet (75 mg total) by mouth daily.  30 tablet  5  . folic acid (FOLVITE) 1 MG tablet Take 1 tablet (1 mg total) by mouth daily.  30 tablet  0  . GuaiFENesin (MUCINEX PO) Take by mouth.      Marland Kitchen lisinopril (PRINIVIL,ZESTRIL) 5 MG tablet Take 1 tablet (5 mg total) by mouth daily.  30 tablet  11  . NASONEX 50 MCG/ACT nasal spray 2 sprays by Nasal route Daily.      Marland Kitchen NITROSTAT 0.4 MG SL tablet Place 0.4 mg under the tongue as needed.      . pantoprazole (PROTONIX) 40 MG tablet Take 1 tablet (40 mg total) by mouth 2 (two) times daily.      Marland Kitchen ZOSTAVAX 78295 UNT/0.65ML injection        No current facility-administered medications for this visit.    Allergies  Allergen Reactions    . Cephalexin   . Oxycodone     itching  . Penicillins   . Sulfa Drugs Cross Reactors Other (See Comments)    Skin irritation.    Past Medical History  Diagnosis Date  . Hypercholesteremia   . Lupus   . GERD (gastroesophageal reflux disease)   . Spinal stenosis   . Fibula fracture     right  . Gallstones   . Arthritis   . CAD (coronary artery disease) 05    stents  . RBBB   . Asthmatic bronchitis     Past Surgical History  Procedure Laterality Date  . Coronary angioplasty  2005  . Breast lumpectomy  1970  . Abdominal hysterectomy  1975  . Fibula fracture surgery  2011    Right Ankle  . Colonoscopy w/ biopsies    . Cholecystectomy  07/04/2011    Procedure: LAPAROSCOPIC CHOLECYSTECTOMY WITH INTRAOPERATIVE CHOLANGIOGRAM;  Surgeon: Merrie Roof, MD;  Location: [facility];  Service: General;  Laterality: N/A;    History  Smoking status  . Never Smoker   Smokeless tobacco  . Not on file    History  Alcohol Use No    Family  History  Problem Relation Age of Onset  . Cancer Father   . Heart attack Mother   . Heart attack Brother   . Cancer Sister   . Cancer Sister   . Anesthesia problems Neg Hx   . Hypotension Neg Hx   . Malignant hyperthermia Neg Hx   . Pseudochol deficiency Neg Hx     Review of Systems: The review of systems is per the HPI.  All other systems were reviewed and are negative.  Physical Exam: BP 116/71  Pulse 80  Ht 5\' 4"  (1.626 m)  Wt 186 lb 1.9 oz (84.423 kg)  BMI 31.93 kg/m2 Patient is alert and in no acute distress. She is obese. Weight is unchanged. Skin is warm and dry. Color is normal.  HEENT is unremarkable. Normocephalic/atraumatic. PERRL. Sclera are nonicteric. Neck is supple. No masses. No JVD. Lungs are clear. Cardiac exam shows a regular rate and rhythm. Abdomen is soft. Extremities are without edema. Gait and ROM are intact. No gross neurologic deficits noted.  LABORATORY DATA:  Lab Results  Component Value Date   WBC 12.8*  04/04/2013   HGB 14.0 04/04/2013   HCT 41.3 04/04/2013   PLT 171 04/04/2013   GLUCOSE 135* 04/12/2013   CHOL 177 12/10/2012   TRIG 106.0 12/10/2012   HDL 57.10 12/10/2012   LDLCALC 99 12/10/2012   ALT 19 12/10/2012   AST 29 12/10/2012   NA 140 04/12/2013   K 4.2 04/12/2013   CL 104 04/12/2013   CREATININE 1.30* 04/12/2013   BUN 22 04/12/2013   CO2 24 04/12/2013   INR 1.07 08/03/2009   HGBA1C 5.4 05/10/2010     Assessment / Plan: 1. CAD - asymptomatic - normal Myoview in May 2014. Continue with CV risk factor modification.   2. HTN - BP is well controlled. Would leave her on her current regimen for now.  3. HLD - on statin.

## 2013-06-14 NOTE — Patient Instructions (Addendum)
Continue your current therapy  I will see you in 6 months with fasting lab work.   

## 2013-06-30 ENCOUNTER — Ambulatory Visit: Payer: 59 | Admitting: Sports Medicine

## 2013-07-06 ENCOUNTER — Ambulatory Visit (INDEPENDENT_AMBULATORY_CARE_PROVIDER_SITE_OTHER): Payer: 59 | Admitting: Sports Medicine

## 2013-07-06 ENCOUNTER — Encounter: Payer: Self-pay | Admitting: Sports Medicine

## 2013-07-06 VITALS — BP 123/79 | Ht 64.0 in | Wt 190.0 lb

## 2013-07-06 DIAGNOSIS — M4800 Spinal stenosis, site unspecified: Secondary | ICD-10-CM

## 2013-07-06 NOTE — Assessment & Plan Note (Addendum)
Foot neuropathy and hamstring discomfort likely stem from patient's spinal stenosis. Patient's PT exercises are doing a great job maintaining her core strength. Her dynamic movement screening exam was benign in clinic today. She is not doing many exercises to target the hamstrings. - Continue current exercises.  - Can be discharged from regular PT but would benefit from PT checkups every 6 weeks. - Add shallow lunge with ankle weights. Do not do deep lunges. - Do not do unsupported exercises with dynadisk or theraball. - Take vitamin B6 50 mgm qd - Follow up in 3 months PRN.

## 2013-07-06 NOTE — Patient Instructions (Signed)
Good to see you.  Continue doing your PT and have them check on you every 6 weeks. Your spinal stenosis is likely the cause of your neuropathy in your foot. However, you are strong and did a good job with dynamic movement screening in the clinic.  Keep working your hanstrings - do shallow lunges with ankle weights. Do not do deep lunges. Do not do any unsupported exercises included dynadisk or theraball. Take B6 which you can get over-the-counter. Follow up as needed in 3 months or sooner if needed.

## 2013-07-06 NOTE — Progress Notes (Signed)
Subjective:   CC: follow up spinal stenosis, left foot neuropathy  HPI:   Follow up spinal stenosis Patient is doing a series of exercises with physical therapist and states her core strength is improving. Her back feels better since doing these exercises. She denies red flag symptoms of new incontinence, perineal numbness/tingling, or leg weakness.   Left foot neuropathy Patient describes this as a "foot feels thick" sensation that has been present >1 year. She always feels it but it is worse if she does not wear her [facility]. She denies skin changes, redness, swelling, or other complaints. She wants to know what other exercises she can do to prevent worsening.  Hamstring discomfort Present >2 months. No pain, but more of a tightness. She is not currently doing PT for hamstrings. Symptoms are worse with sitting > 1 hour.    Review of Systems - Per HPI.  SH: Doing exercises religiously; doing some pool exercises as well.    Objective:  Physical Exam BP 123/79  Ht 5\' 4"  (1.626 m)  Wt 190 lb (86.183 kg)  BMI 32.60 kg/m2 GEN: NAD, pleasant EXTR: No LE edema, tenderness, deformity, or erythema GAIT:  Normal gait, normal heel walk, normal sideways and tight rope walk Unable to toe walk due to discomfort from hamstring tightness Full flexion, extension, and left and right lateral bending of lumbar spine with no pain Normal flexion at hip and extension at knee bilaterally    Assessment:     Pamela Gonzales is a 80 y.o. female with h/o spinal stenosis here for follow up and to discuss left foot neuropathy.    Plan:     Follow up spinal stenosis Left foot neuropathy Hamstring discomfort Foot neuropathy and hamstring discomfort likely stem from patient's spinal stenosis. Patient's PT exercises are doing a great job maintaining her core strength. Her dynamic movement screening exam was benign in clinic today. She is not doing many exercises to target the hamstrings. -  Continue current exercises.  - Can be discharged from regular PT but would benefit from PT checkups every 6 weeks. - Add shallow lunge with ankle weights. Do not do deep lunges. - Do not do unsupported exercises with dynadisk or theraball. - Take vitamin B6 50 mgm daily - Follow up in 3 months PRN.   Hilton Sinclair, MD [place]

## 2013-08-19 ENCOUNTER — Other Ambulatory Visit: Payer: Self-pay | Admitting: Cardiology

## 2013-09-01 ENCOUNTER — Other Ambulatory Visit: Payer: Self-pay

## 2013-09-01 ENCOUNTER — Telehealth: Payer: Self-pay | Admitting: Cardiology

## 2013-09-01 DIAGNOSIS — I4510 Unspecified right bundle-branch block: Secondary | ICD-10-CM

## 2013-09-01 DIAGNOSIS — IMO0001 Reserved for inherently not codable concepts without codable children: Secondary | ICD-10-CM

## 2013-09-01 DIAGNOSIS — R030 Elevated blood-pressure reading, without diagnosis of hypertension: Secondary | ICD-10-CM

## 2013-09-01 DIAGNOSIS — E7800 Pure hypercholesterolemia, unspecified: Secondary | ICD-10-CM

## 2013-09-01 NOTE — Telephone Encounter (Signed)
Returned call to patient she stated she would like to have fasting lab done a few days before her November appointment with Dr.Jordan.Lab order mailed to patient to have done at [facility] lab.

## 2013-09-01 NOTE — Telephone Encounter (Signed)
New Message  Pt called requests to come in early for labs. Please put in orders// or send the pt orders to have the lab completed at [facility]

## 2013-10-08 ENCOUNTER — Telehealth: Payer: Self-pay | Admitting: Cardiology

## 2013-10-08 ENCOUNTER — Other Ambulatory Visit: Payer: Self-pay

## 2013-10-08 MED ORDER — ATORVASTATIN CALCIUM 40 MG PO TABS: 40.0000 mg | ORAL_TABLET | Freq: Every day | ORAL | Status: DC

## 2013-10-08 NOTE — Telephone Encounter (Signed)
Pt called in wanting to know why her prescriptions have not been authorized to get refilled. Please call  Thanks

## 2013-10-08 NOTE — Telephone Encounter (Signed)
Patient had just called and spoke directly to Elly Modena, who got all medications taken care of.

## 2013-10-21 ENCOUNTER — Other Ambulatory Visit: Payer: Self-pay | Admitting: *Deleted

## 2013-10-21 ENCOUNTER — Telehealth: Payer: Self-pay | Admitting: Cardiology

## 2013-10-21 MED ORDER — CLOPIDOGREL BISULFATE 75 MG PO TABS
75.0000 mg | ORAL_TABLET | Freq: Every day | ORAL | Status: DC
Start: 2013-10-21 — End: 2013-12-16

## 2013-10-21 NOTE — Telephone Encounter (Signed)
Need a refill on her Plavix. Please call [facility] to [facility],she needs this today. Also need her Lisinopril,she said that was already called in.She wants to pick them both up today.Marland Kitchen

## 2013-10-22 ENCOUNTER — Other Ambulatory Visit: Payer: Self-pay | Admitting: Obstetrics and Gynecology

## 2013-10-22 ENCOUNTER — Other Ambulatory Visit: Payer: Self-pay

## 2013-10-22 DIAGNOSIS — Z1231 Encounter for screening mammogram for malignant neoplasm of breast: Secondary | ICD-10-CM

## 2013-10-22 DIAGNOSIS — M810 Age-related osteoporosis without current pathological fracture: Secondary | ICD-10-CM

## 2013-11-04 ENCOUNTER — Other Ambulatory Visit: Payer: Self-pay | Admitting: *Deleted

## 2013-11-04 MED ORDER — FOLIC ACID 1 MG PO TABS: 1.0000 mg | ORAL_TABLET | Freq: Every day | ORAL | Status: DC

## 2013-12-01 ENCOUNTER — Ambulatory Visit
Admission: RE | Admit: 2013-12-01 | Discharge: 2013-12-01 | Disposition: A | Discharge status: Home / Self Care | Payer: 59 | Source: Ambulatory Visit | Attending: Obstetrics and Gynecology | Admitting: Obstetrics and Gynecology

## 2013-12-01 ENCOUNTER — Ambulatory Visit
Admission: RE | Admit: 2013-12-01 | Discharge: 2013-12-01 | Disposition: A | Discharge status: Home / Self Care | Payer: 59 | Source: Ambulatory Visit

## 2013-12-01 DIAGNOSIS — M810 Age-related osteoporosis without current pathological fracture: Secondary | ICD-10-CM

## 2013-12-01 DIAGNOSIS — Z1231 Encounter for screening mammogram for malignant neoplasm of breast: Secondary | ICD-10-CM

## 2013-12-03 ENCOUNTER — Telehealth: Payer: Self-pay | Admitting: Cardiology

## 2013-12-03 DIAGNOSIS — Z79899 Other long term (current) drug therapy: Secondary | ICD-10-CM

## 2013-12-03 NOTE — Telephone Encounter (Signed)
Pt is coming in for lab work on Tuesday and she wants you to add the Vitamin D test please.

## 2013-12-03 NOTE — Telephone Encounter (Signed)
Returned call to patient Vit D ordered for 12/07/13.

## 2013-12-07 ENCOUNTER — Other Ambulatory Visit: Payer: Self-pay | Admitting: Cardiology

## 2013-12-07 LAB — CBC WITH DIFFERENTIAL/PLATELET
Basophils Absolute: 0.1 10*3/uL (ref 0.0–0.1)
Basophils Relative: 1 % (ref 0–1)
EOS ABS: 0.4 10*3/uL (ref 0.0–0.7)
Eosinophils Relative: 7 % — ABNORMAL HIGH (ref 0–5)
HEMATOCRIT: 41.6 % (ref 36.0–46.0)
HEMOGLOBIN: 13.7 g/dL (ref 12.0–15.0)
Lymphocytes Relative: 19 % (ref 12–46)
Lymphs Abs: 1.1 10*3/uL (ref 0.7–4.0)
MCH: 31.2 pg (ref 26.0–34.0)
MCHC: 32.9 g/dL (ref 30.0–36.0)
MCV: 94.8 fL (ref 78.0–100.0)
MONOS PCT: 14 % — AB (ref 3–12)
Monocytes Absolute: 0.8 10*3/uL (ref 0.1–1.0)
Neutro Abs: 3.3 10*3/uL (ref 1.7–7.7)
Neutrophils Relative %: 59 % (ref 43–77)
Platelets: 191 10*3/uL (ref 150–400)
RBC: 4.39 MIL/uL (ref 3.87–5.11)
RDW: 14.2 % (ref 11.5–15.5)
WBC: 5.6 10*3/uL (ref 4.0–10.5)

## 2013-12-08 LAB — HEPATIC FUNCTION PANEL
ALK PHOS: 49 U/L (ref 39–117)
ALT: 19 U/L (ref 0–35)
AST: 26 U/L (ref 0–37)
Albumin: 3.9 g/dL (ref 3.5–5.2)
BILIRUBIN DIRECT: 0.1 mg/dL (ref 0.0–0.3)
BILIRUBIN INDIRECT: 0.6 mg/dL (ref 0.2–1.2)
Total Bilirubin: 0.7 mg/dL (ref 0.2–1.2)
Total Protein: 6.5 g/dL (ref 6.0–8.3)

## 2013-12-08 LAB — VITAMIN D 25 HYDROXY (VIT D DEFICIENCY, FRACTURES): Vit D, 25-Hydroxy: 60 ng/mL (ref 30–89)

## 2013-12-08 LAB — BASIC METABOLIC PANEL
BUN: 15 mg/dL (ref 6–23)
CO2: 28 mEq/L (ref 19–32)
CREATININE: 0.81 mg/dL (ref 0.50–1.10)
Calcium: 9.5 mg/dL (ref 8.4–10.5)
Chloride: 105 mEq/L (ref 96–112)
GLUCOSE: 81 mg/dL (ref 70–99)
Potassium: 5 mEq/L (ref 3.5–5.3)
Sodium: 141 mEq/L (ref 135–145)

## 2013-12-08 LAB — LIPID PANEL
Cholesterol: 159 mg/dL (ref 0–200)
HDL: 58 mg/dL (ref >39)
LDL Cholesterol: 83 mg/dL (ref 0–99)
Total CHOL/HDL Ratio: 2.7 Ratio
Triglycerides: 88 mg/dL (ref <150)
VLDL: 18 mg/dL (ref 0–40)

## 2013-12-09 ENCOUNTER — Encounter: Payer: Self-pay | Admitting: Cardiology

## 2013-12-09 ENCOUNTER — Ambulatory Visit (INDEPENDENT_AMBULATORY_CARE_PROVIDER_SITE_OTHER): Payer: 59 | Admitting: Cardiology

## 2013-12-09 VITALS — BP 140/70 | HR 78 | Ht 64.5 in | Wt 193.0 lb

## 2013-12-09 DIAGNOSIS — I10 Essential (primary) hypertension: Secondary | ICD-10-CM

## 2013-12-09 DIAGNOSIS — I4510 Unspecified right bundle-branch block: Secondary | ICD-10-CM

## 2013-12-09 DIAGNOSIS — I2510 Atherosclerotic heart disease of native coronary artery without angina pectoris: Secondary | ICD-10-CM

## 2013-12-09 DIAGNOSIS — E7800 Pure hypercholesterolemia, unspecified: Secondary | ICD-10-CM

## 2013-12-09 NOTE — Progress Notes (Signed)
Pamela Gonzales Date of Birth: [birth year] Medical Record #[id number]  History of Present Illness: Pamela Gonzales is seen back today for follow up of CAD. She has a history of HLD, Lupus, GERD, OA, RBBB (felt to be benign) and CAD with past stents placed in 2005. She had stenting of the mid LAD and 2nd OM in 2005 with Cypher stents. Last Myoview was in May of 2014 was low risk. She has remained on Plavix.   Since her last visit she has done well from a cardiac standpoint without chest pain. She goes to the gym 6 days/week. She is limited some by spinal stenosis and disc problems but stays very active. Still struggles with weight. Overall feels very well.  Current Outpatient Prescriptions  Medication Sig Dispense Refill  . aspirin 81 MG tablet Take 81 mg by mouth daily.      Marland Kitchen atorvastatin (LIPITOR) 40 MG tablet Take 1 tablet (40 mg total) by mouth daily. 90 tablet 3  . BOOSTRIX 5-2.5-18.5 injection     . Calcium Citrate-Vitamin D (CITRACAL + D PO) Take by mouth.    . clopidogrel (PLAVIX) 75 MG tablet Take 1 tablet (75 mg total) by mouth daily. 30 tablet 1  . folic acid (FOLVITE) 1 MG tablet Take 1 tablet (1 mg total) by mouth daily. 90 tablet 0  . GuaiFENesin (MUCINEX PO) Take 1 tablet by mouth 2 (two) times daily.     Marland Kitchen lisinopril (PRINIVIL,ZESTRIL) 5 MG tablet TAKE 1 TABLET (5 MG TOTAL) BY MOUTH DAILY. 30 tablet 6  . MYRBETRIQ 50 MG TB24 tablet Take 1 tablet by mouth daily.  2  . NASONEX 50 MCG/ACT nasal spray 2 sprays by Nasal route Daily.    Marland Kitchen NITROSTAT 0.4 MG SL tablet Place 0.4 mg under the tongue as needed.    . pantoprazole (PROTONIX) 40 MG tablet Take 1 tablet (40 mg total) by mouth 2 (two) times daily.    Marland Kitchen ZOSTAVAX [id number] UNT/0.65ML injection      No current facility-administered medications for this visit.    Allergies  Allergen Reactions  . Cephalexin   . Oxycodone     itching  . Penicillins   . Sulfa Drugs Cross Reactors Other (See Comments)    Skin irritation.    Past  Medical History  Diagnosis Date  . Hypercholesteremia   . Lupus   . GERD (gastroesophageal reflux disease)   . Spinal stenosis   . Fibula fracture     right  . Gallstones   . Arthritis   . CAD (coronary artery disease) 05    stents  . RBBB   . Asthmatic bronchitis     Past Surgical History  Procedure Laterality Date  . Coronary angioplasty  2005  . Breast lumpectomy  1970  . Abdominal hysterectomy  1975  . Fibula fracture surgery  2011    Right Ankle  . Colonoscopy w/ biopsies    . Cholecystectomy  07/04/2011    Procedure: LAPAROSCOPIC CHOLECYSTECTOMY WITH INTRAOPERATIVE CHOLANGIOGRAM;  Surgeon: Merrie Roof, MD;  Location: [place];  Service: General;  Laterality: N/A;    History  Smoking status  . Never Smoker   Smokeless tobacco  . Not on file    History  Alcohol Use No    Family History  Problem Relation Age of Onset  . Cancer Father   . Heart attack Mother   . Heart attack Brother   . Cancer Sister   . Cancer Sister   .  Anesthesia problems Neg Hx   . Hypotension Neg Hx   . Malignant hyperthermia Neg Hx   . Pseudochol deficiency Neg Hx     Review of Systems: The review of systems is per the HPI.  All other systems were reviewed and are negative.  Physical Exam: BP 140/70 mmHg  Pulse 78  Ht 5' 4.5" (1.638 m)  Wt 193 lb (87.544 kg)  BMI 32.63 kg/m2 Patient is alert and in no acute distress. She is obese.  Skin is warm and dry. Color is normal.  HEENT is unremarkable. Normocephalic/atraumatic. PERRL. Sclera are nonicteric. Neck is supple. No masses. No JVD. Lungs are clear. Cardiac exam shows a regular rate and rhythm. Abdomen is soft. Extremities are without edema. Gait and ROM are intact. No gross neurologic deficits noted.  LABORATORY DATA:  Lab Results  Component Value Date   WBC 5.6 12/07/2013   HGB 13.7 12/07/2013   HCT 41.6 12/07/2013   PLT 191 12/07/2013   GLUCOSE 81 12/07/2013   CHOL 159 12/07/2013   TRIG 88 12/07/2013   HDL 58  12/07/2013   LDLCALC 83 12/07/2013   ALT 19 12/07/2013   AST 26 12/07/2013   NA 141 12/07/2013   K 5.0 12/07/2013   CL 105 12/07/2013   CREATININE 0.81 12/07/2013   BUN 15 12/07/2013   CO2 28 12/07/2013   INR 1.07 08/03/2009   HGBA1C 5.4 05/10/2010   Ecg: NSR with RBBB. No change from May 2014. I have personally reviewed and interpreted this study.   Assessment / Plan: 1. CAD - asymptomatic - normal Myoview in May 2014. Continue with CV risk factor modification.   2. HTN - BP is well controlled. Continue her current regimen.  3. HLD - on statin. Lipids are satisfactory. LFTs are normal.  I will follow up in 6 months.

## 2013-12-09 NOTE — Patient Instructions (Signed)
Continue your current therapy  I will see you in 6 months.   

## 2013-12-16 ENCOUNTER — Other Ambulatory Visit: Payer: Self-pay

## 2013-12-16 MED ORDER — CLOPIDOGREL BISULFATE 75 MG PO TABS: 75.0000 mg | ORAL_TABLET | Freq: Every day | ORAL | Status: DC

## 2013-12-27 ENCOUNTER — Telehealth: Payer: Self-pay | Admitting: Cardiology

## 2013-12-27 NOTE — Telephone Encounter (Signed)
Please call,have a question about her lab results she received.She says it is on page 4 or 5 of her lab results.

## 2013-12-27 NOTE — Telephone Encounter (Signed)
Returned call to patient she stated she was looking at 12/09/13 lab results on mychart.Stated she wanted to know what was abnormal.Dr.Jordan reviewed lab, advised cbc and chemistries normal,lipids look good.

## 2014-01-27 ENCOUNTER — Other Ambulatory Visit: Payer: Self-pay | Admitting: *Deleted

## 2014-01-27 MED ORDER — FOLIC ACID 1 MG PO TABS: 1.0000 mg | ORAL_TABLET | Freq: Every day | ORAL | Status: DC

## 2014-01-27 MED ORDER — LISINOPRIL 5 MG PO TABS: ORAL_TABLET | ORAL | Status: DC

## 2014-03-11 ENCOUNTER — Encounter (HOSPITAL_COMMUNITY): Payer: Self-pay

## 2014-03-11 ENCOUNTER — Other Ambulatory Visit (HOSPITAL_COMMUNITY): Payer: Self-pay | Admitting: *Deleted

## 2014-03-14 ENCOUNTER — Encounter (HOSPITAL_COMMUNITY): Payer: Self-pay

## 2014-03-15 ENCOUNTER — Encounter (HOSPITAL_COMMUNITY): Payer: Self-pay

## 2014-03-21 ENCOUNTER — Other Ambulatory Visit (HOSPITAL_COMMUNITY): Payer: Self-pay | Admitting: *Deleted

## 2014-03-24 ENCOUNTER — Encounter (HOSPITAL_COMMUNITY)
Admission: RE | Admit: 2014-03-24 | Discharge: 2014-03-24 | Disposition: A | Discharge status: Home / Self Care | Payer: Medicare Other | Source: Ambulatory Visit | Attending: Obstetrics and Gynecology | Admitting: Obstetrics and Gynecology

## 2014-03-24 DIAGNOSIS — M810 Age-related osteoporosis without current pathological fracture: Secondary | ICD-10-CM | POA: Diagnosis present

## 2014-03-24 MED ORDER — SODIUM CHLORIDE 0.9 % IV SOLN: Freq: Once | INTRAVENOUS | Status: DC

## 2014-03-24 MED ORDER — ZOLEDRONIC ACID 5 MG/100ML IV SOLN
5.0000 mg | Freq: Once | INTRAVENOUS | Status: AC
  Administered 2014-03-24: 5 mg via INTRAVENOUS

## 2014-03-25 MED ORDER — ZOLEDRONIC ACID 5 MG/100ML IV SOLN
INTRAVENOUS | Status: AC
  Filled 2014-03-25: qty 100

## 2014-06-24 ENCOUNTER — Telehealth: Payer: Self-pay | Admitting: Cardiology

## 2014-06-24 DIAGNOSIS — I4510 Unspecified right bundle-branch block: Secondary | ICD-10-CM

## 2014-06-24 DIAGNOSIS — I10 Essential (primary) hypertension: Secondary | ICD-10-CM

## 2014-06-24 DIAGNOSIS — E7800 Pure hypercholesterolemia, unspecified: Secondary | ICD-10-CM

## 2014-06-24 NOTE — Telephone Encounter (Signed)
Returned call to patient no answer.LMTC. 

## 2014-06-24 NOTE — Telephone Encounter (Signed)
Pt has an appt with Dr Martinique on this Wednesday morning. Does she need lab work before her appointment on Wednesday?If not before,does she need to fast the day she comes for her appointment?

## 2014-06-24 NOTE — Telephone Encounter (Signed)
Returned call to patient she will have fasting lab 06/28/14.

## 2014-06-24 NOTE — Telephone Encounter (Signed)
Returning your call. °

## 2014-06-28 ENCOUNTER — Telehealth: Payer: Self-pay | Admitting: Cardiology

## 2014-06-28 ENCOUNTER — Encounter: Payer: Self-pay | Admitting: *Deleted

## 2014-06-28 DIAGNOSIS — M8580 Other specified disorders of bone density and structure, unspecified site: Secondary | ICD-10-CM

## 2014-06-28 NOTE — Telephone Encounter (Signed)
Returned call to patient she stated she has never had low Vit D.Stated she has osteopenia which will not cover Vit D.Stated she will not have done.

## 2014-06-28 NOTE — Telephone Encounter (Signed)
Returning you call. Please call .Marland Kitchen Thanks

## 2014-06-29 LAB — HEPATIC FUNCTION PANEL
ALBUMIN: 4 g/dL (ref 3.5–5.2)
ALK PHOS: 44 U/L (ref 39–117)
ALT: 18 U/L (ref 0–35)
AST: 26 U/L (ref 0–37)
BILIRUBIN DIRECT: 0.2 mg/dL (ref 0.0–0.3)
Indirect Bilirubin: 0.7 mg/dL (ref 0.2–1.2)
Total Bilirubin: 0.9 mg/dL (ref 0.2–1.2)
Total Protein: 6.5 g/dL (ref 6.0–8.3)

## 2014-06-29 LAB — CBC WITH DIFFERENTIAL/PLATELET
BASOS ABS: 0.1 10*3/uL (ref 0.0–0.1)
Basophils Relative: 1 % (ref 0–1)
Eosinophils Absolute: 0.2 10*3/uL (ref 0.0–0.7)
Eosinophils Relative: 4 % (ref 0–5)
HCT: 41.9 % (ref 36.0–46.0)
Hemoglobin: 14 g/dL (ref 12.0–15.0)
LYMPHS PCT: 20 % (ref 12–46)
Lymphs Abs: 1.1 10*3/uL (ref 0.7–4.0)
MCH: 31.4 pg (ref 26.0–34.0)
MCHC: 33.4 g/dL (ref 30.0–36.0)
MCV: 93.9 fL (ref 78.0–100.0)
MONO ABS: 0.6 10*3/uL (ref 0.1–1.0)
MPV: 10.6 fL (ref 8.6–12.4)
Monocytes Relative: 11 % (ref 3–12)
Neutro Abs: 3.5 10*3/uL (ref 1.7–7.7)
Neutrophils Relative %: 64 % (ref 43–77)
Platelets: 192 10*3/uL (ref 150–400)
RBC: 4.46 MIL/uL (ref 3.87–5.11)
RDW: 14 % (ref 11.5–15.5)
WBC: 5.5 10*3/uL (ref 4.0–10.5)

## 2014-06-29 LAB — BASIC METABOLIC PANEL
BUN: 13 mg/dL (ref 6–23)
CALCIUM: 9.4 mg/dL (ref 8.4–10.5)
CO2: 29 meq/L (ref 19–32)
Chloride: 104 mEq/L (ref 96–112)
Creat: 0.9 mg/dL (ref 0.50–1.10)
GLUCOSE: 95 mg/dL (ref 70–99)
Potassium: 4.8 mEq/L (ref 3.5–5.3)
Sodium: 142 mEq/L (ref 135–145)

## 2014-06-29 LAB — LIPID PANEL
CHOL/HDL RATIO: 2.9 ratio
CHOLESTEROL: 180 mg/dL (ref 0–200)
HDL: 63 mg/dL (ref >=46)
LDL CALC: 95 mg/dL (ref 0–99)
Triglycerides: 112 mg/dL (ref <150)
VLDL: 22 mg/dL (ref 0–40)

## 2014-06-30 ENCOUNTER — Ambulatory Visit (INDEPENDENT_AMBULATORY_CARE_PROVIDER_SITE_OTHER): Payer: 59 | Admitting: Cardiology

## 2014-06-30 ENCOUNTER — Encounter: Payer: Self-pay | Admitting: Cardiology

## 2014-06-30 VITALS — BP 126/64 | HR 86 | Ht 64.0 in | Wt 195.0 lb

## 2014-06-30 DIAGNOSIS — J45909 Unspecified asthma, uncomplicated: Secondary | ICD-10-CM | POA: Insufficient documentation

## 2014-06-30 DIAGNOSIS — I10 Essential (primary) hypertension: Secondary | ICD-10-CM | POA: Diagnosis not present

## 2014-06-30 DIAGNOSIS — E7800 Pure hypercholesterolemia, unspecified: Secondary | ICD-10-CM

## 2014-06-30 DIAGNOSIS — I2510 Atherosclerotic heart disease of native coronary artery without angina pectoris: Secondary | ICD-10-CM

## 2014-06-30 DIAGNOSIS — J4530 Mild persistent asthma, uncomplicated: Secondary | ICD-10-CM | POA: Diagnosis not present

## 2014-06-30 NOTE — Patient Instructions (Signed)
Stop lisinopril   Monitor your blood pressure and let me know if it increases.  Continue your other therapy  I will see you in 6 months.

## 2014-06-30 NOTE — Progress Notes (Signed)
Pamela Gonzales Date of Birth: 1930/08/17 Medical Record #[id number]  History of Present Illness: Pamela Gonzales is seen back today for follow up of CAD. She has a history of HLD, Lupus, GERD, OA, RBBB (felt to be benign) and CAD with past stents placed in 2005. She had stenting of the mid LAD and 2nd OM in 2005 with Cypher stents. Last Myoview was in May of 2014 was low risk. She has remained on Plavix.   Since her last visit she has done well from a cardiac standpoint without chest pain. She goes to the gym 6 days/week and does some water exercises. She is limited some by spinal stenosis and disc problems but stays very active. Still struggles with weight. She does note that she has had bronchitis in February the last 2 years and she is having some persistent asthma symptoms. On QVar. Notes some left sided earache and sore throat on the left side. Occasional cough.   Current Outpatient Prescriptions  Medication Sig Dispense Refill  . albuterol (PROVENTIL HFA;VENTOLIN HFA) 108 (90 BASE) MCG/ACT inhaler Inhale 2 puffs into the lungs every 6 (six) hours as needed for wheezing or shortness of breath.    Marland Kitchen aspirin 81 MG tablet Take 81 mg by mouth daily.      Marland Kitchen atorvastatin (LIPITOR) 40 MG tablet Take 1 tablet (40 mg total) by mouth daily. 90 tablet 3  . beclomethasone (QVAR) 40 MCG/ACT inhaler Inhale 2 puffs into the lungs daily.    . Calcium Citrate-Vitamin D (CITRACAL + D PO) Take by mouth.    . clopidogrel (PLAVIX) 75 MG tablet Take 1 tablet (75 mg total) by mouth daily. 90 tablet 3  . folic acid (FOLVITE) 1 MG tablet Take 1 tablet (1 mg total) by mouth daily. 90 tablet 1  . GuaiFENesin (MUCINEX PO) Take 1 tablet by mouth 2 (two) times daily.     Marland Kitchen MYRBETRIQ 50 MG TB24 tablet Take 1 tablet by mouth daily.  2  . NASONEX 50 MCG/ACT nasal spray 2 sprays by Nasal route Daily.    Marland Kitchen NITROSTAT 0.4 MG SL tablet Place 0.4 mg under the tongue as needed.    . pantoprazole (PROTONIX) 40 MG tablet Take 1 tablet  (40 mg total) by mouth 2 (two) times daily. (Patient taking differently: Take 40 mg by mouth daily. )     No current facility-administered medications for this visit.    Allergies  Allergen Reactions  . Cephalexin   . Oxycodone     itching  . Penicillins   . Sulfa Drugs Cross Reactors Other (See Comments)    Skin irritation.    Past Medical History  Diagnosis Date  . Hypercholesteremia   . Lupus   . GERD (gastroesophageal reflux disease)   . Spinal stenosis   . Fibula fracture     right  . Gallstones   . Arthritis   . CAD (coronary artery disease) 05    stents  . RBBB   . Asthmatic bronchitis     Past Surgical History  Procedure Laterality Date  . Coronary angioplasty  2005  . Breast lumpectomy  1970  . Abdominal hysterectomy  1975  . Fibula fracture surgery  2011    Right Ankle  . Colonoscopy w/ biopsies    . Cholecystectomy  07/04/2011    Procedure: LAPAROSCOPIC CHOLECYSTECTOMY WITH INTRAOPERATIVE CHOLANGIOGRAM;  Surgeon: Merrie Roof, MD;  Location: [place];  Service: General;  Laterality: N/A;    History  Smoking status  . Never Smoker   Smokeless tobacco  . Not on file    History  Alcohol Use No    Family History  Problem Relation Age of Onset  . Cancer Father   . Heart attack Mother   . Heart attack Brother   . Cancer Sister   . Cancer Sister   . Anesthesia problems Neg Hx   . Hypotension Neg Hx   . Malignant hyperthermia Neg Hx   . Pseudochol deficiency Neg Hx     Review of Systems: The review of systems is per the HPI.  All other systems were reviewed and are negative.  Physical Exam: BP 126/64 mmHg  Pulse 86  Ht 5\' 4"  (1.626 m)  Wt 88.451 kg (195 lb)  BMI 33.46 kg/m2 Patient is alert and in no acute distress. She is obese.  Skin is warm and dry. Color is normal.  HEENT is unremarkable. Normocephalic/atraumatic. PERRL. Sclera are nonicteric. Neck is supple. No masses. No JVD. Lungs are clear. Cardiac exam shows a regular rate and  rhythm. Abdomen is soft. Extremities are without edema. Gait and ROM are intact. No gross neurologic deficits noted.  LABORATORY DATA:  Lab Results  Component Value Date   WBC 5.5 06/28/2014   HGB 14.0 06/28/2014   HCT 41.9 06/28/2014   PLT 192 06/28/2014   GLUCOSE 95 06/28/2014   CHOL 180 06/28/2014   TRIG 112 06/28/2014   HDL 63 06/28/2014   LDLCALC 95 06/28/2014   ALT 18 06/28/2014   AST 26 06/28/2014   NA 142 06/28/2014   K 4.8 06/28/2014   CL 104 06/28/2014   CREATININE 0.90 06/28/2014   BUN 13 06/28/2014   CO2 29 06/28/2014   INR 1.07 08/03/2009   HGBA1C 5.4 05/10/2010   Ecg: NSR with RBBB. No change from May 2014. I have    Assessment / Plan: 1. CAD - s/p stenting of the LAD and OM in 2005 with Cypher stents. Asymptomatic - normal Myoview in May 2014. Continue with CV risk factor modification. Continue ASA and Plavix.  2. HTN - BP is well controlled.   3. HLD - on statin. Lipids are satisfactory. LFTs are normal.  4. Asthma. I wonder if ACEi may be contributing to her symptoms. She is going to stop ACEi and we will see if this helps. She is going to track her BP. If it increased we could start losartan instead.   I will follow up in 6 months.

## 2014-07-22 ENCOUNTER — Other Ambulatory Visit: Payer: Self-pay

## 2014-07-22 MED ORDER — FOLIC ACID 1 MG PO TABS: 1.0000 mg | ORAL_TABLET | Freq: Every day | ORAL | Status: DC

## 2014-08-04 ENCOUNTER — Telehealth: Payer: Self-pay | Admitting: Cardiology

## 2014-08-04 NOTE — Addendum Note (Signed)
Addended by: Golden Hurter D on: 08/04/2014 01:40 PM   Modules accepted: Orders, Medications

## 2014-08-04 NOTE — Telephone Encounter (Signed)
She could cut her folate dose in half 0.5 mg  Keonia Pasko Martinique MD, [facility]

## 2014-08-04 NOTE — Telephone Encounter (Signed)
Pt called in stating that she would like to speak with [name] about her Folic Acid. She is concerned that she might be taking too much. She states that she is taking 1 mg due to what was prescribed and she says that her Setrum Silver contains 488mcgs. Please call  Thanks

## 2014-08-04 NOTE — Telephone Encounter (Signed)
Received call from patient Dr.Jordan advised to decrease folic acid to 0.5 mg daily.

## 2014-08-04 NOTE — Telephone Encounter (Signed)
FORWARD TO DR Martinique AND CHERYL WILL DEFER?  WILL CALL PATIENT WITH ANSWER

## 2014-08-04 NOTE — Telephone Encounter (Signed)
Returned call to patient no answer.LMTC. 

## 2014-08-29 ENCOUNTER — Other Ambulatory Visit: Payer: Self-pay | Admitting: *Deleted

## 2014-08-29 MED ORDER — FOLIC ACID 1 MG PO TABS: ORAL_TABLET | ORAL | Status: DC

## 2014-08-30 ENCOUNTER — Encounter: Payer: Self-pay | Admitting: Cardiology

## 2014-08-30 NOTE — Telephone Encounter (Signed)
Received a email from patient.She wanted to ask Dr.Jordan if ok to take Osteo Flex for joint stiffness.Also requested recent lab to be sent to PCP Dr.Ross and she requested copy to be mailed to her.Copy of recent lab faxed to [place] and copy mailed to patient.Message sent to [place].

## 2014-10-24 ENCOUNTER — Other Ambulatory Visit: Payer: Self-pay | Admitting: *Deleted

## 2014-10-24 ENCOUNTER — Other Ambulatory Visit: Payer: Self-pay | Admitting: Cardiology

## 2014-10-24 MED ORDER — ATORVASTATIN CALCIUM 40 MG PO TABS: 40.0000 mg | ORAL_TABLET | Freq: Every day | ORAL | Status: DC

## 2014-10-24 MED ORDER — FOLIC ACID 1 MG PO TABS: ORAL_TABLET | ORAL | Status: DC

## 2014-11-09 ENCOUNTER — Other Ambulatory Visit: Payer: Self-pay

## 2014-11-09 DIAGNOSIS — Z1231 Encounter for screening mammogram for malignant neoplasm of breast: Secondary | ICD-10-CM

## 2014-12-16 ENCOUNTER — Other Ambulatory Visit: Payer: Self-pay | Admitting: *Deleted

## 2014-12-16 MED ORDER — CLOPIDOGREL BISULFATE 75 MG PO TABS: 75.0000 mg | ORAL_TABLET | Freq: Every day | ORAL | Status: DC

## 2014-12-19 ENCOUNTER — Ambulatory Visit
Admission: RE | Admit: 2014-12-19 | Discharge: 2014-12-19 | Disposition: A | Discharge status: Home / Self Care | Payer: Medicare Other | Source: Ambulatory Visit

## 2014-12-19 DIAGNOSIS — Z1231 Encounter for screening mammogram for malignant neoplasm of breast: Secondary | ICD-10-CM

## 2014-12-21 ENCOUNTER — Telehealth: Payer: Self-pay | Admitting: Cardiology

## 2014-12-21 MED ORDER — CLOPIDOGREL BISULFATE 75 MG PO TABS: 75.0000 mg | ORAL_TABLET | Freq: Every day | ORAL | Status: DC

## 2014-12-21 NOTE — Telephone Encounter (Signed)
°*  STAT* If patient is at the pharmacy, call can be transferred to refill team.   1. Which medications need to be refilled? (please list name of each medication and dose if known) Plavix   2. Which pharmacy/location (including street and city if local pharmacy) [facility] [address]  3. Do they need a 30 day or 90 day supply? [place]

## 2014-12-21 NOTE — Telephone Encounter (Signed)
90 day refill for Plavix sent to pharmacy.

## 2015-01-11 ENCOUNTER — Encounter: Payer: Self-pay | Admitting: Cardiology

## 2015-01-11 ENCOUNTER — Ambulatory Visit (INDEPENDENT_AMBULATORY_CARE_PROVIDER_SITE_OTHER): Payer: Medicare Other | Admitting: Cardiology

## 2015-01-11 VITALS — BP 136/80 | HR 78 | Ht 64.0 in | Wt 196.7 lb

## 2015-01-11 DIAGNOSIS — I4510 Unspecified right bundle-branch block: Secondary | ICD-10-CM | POA: Diagnosis not present

## 2015-01-11 DIAGNOSIS — I10 Essential (primary) hypertension: Secondary | ICD-10-CM

## 2015-01-11 DIAGNOSIS — I2510 Atherosclerotic heart disease of native coronary artery without angina pectoris: Secondary | ICD-10-CM

## 2015-01-11 DIAGNOSIS — E7800 Pure hypercholesterolemia, unspecified: Secondary | ICD-10-CM

## 2015-01-11 NOTE — Patient Instructions (Signed)
Continue your current therapy  I will see you in 6 months with lab work   

## 2015-01-11 NOTE — Progress Notes (Signed)
Pamela Gonzales Date of Birth: 10-Dec-1930 Medical Record [id number]  History of Present Illness: Pamela Gonzales is seen back today for follow up of CAD. She has a history of HLD, Lupus, GERD, OA, RBBB and CAD with past stents placed in 2005. She had stenting of the mid LAD and 2nd OM in 2005 with Cypher stents. Last Myoview was in May of 2014 was low risk. She has remained on Plavix.   On follow up today she is feeling very well. No chest pain or dyspnea. Prior cough resolved with stopping lisinopril. Has some joint stiffness but still exercises at the gym daily.   Current Outpatient Prescriptions  Medication Sig Dispense Refill  . albuterol (PROVENTIL HFA;VENTOLIN HFA) 108 (90 BASE) MCG/ACT inhaler Inhale 2 puffs into the lungs every 6 (six) hours as needed for wheezing or shortness of breath.    Marland Kitchen aspirin 81 MG tablet Take 81 mg by mouth daily.      Marland Kitchen atorvastatin (LIPITOR) 40 MG tablet Take 1 tablet (40 mg total) by mouth daily. 90 tablet 2  . beclomethasone (QVAR) 40 MCG/ACT inhaler Inhale 2 puffs into the lungs daily.    . clopidogrel (PLAVIX) 75 MG tablet Take 1 tablet (75 mg total) by mouth daily. 90 tablet 3  . folic acid (FOLVITE) 0.5 MG tablet Take 0.5 mg by mouth daily.    . Misc Natural Products (OSTEO BI-FLEX ADV DOUBLE ST) TABS Take 1 tablet by mouth daily.    . Multiple Vitamins-Minerals (CENTRUM SILVER ULTRA MENS) TABS Take 220 tablets by mouth daily.    Marland Kitchen MYRBETRIQ 50 MG TB24 tablet Take 1 tablet by mouth daily.  2  . NASONEX 50 MCG/ACT nasal spray 2 sprays by Nasal route Daily.    Marland Kitchen NITROSTAT 0.4 MG SL tablet Place 0.4 mg under the tongue as needed.    . pantoprazole (PROTONIX) 40 MG tablet Take 1 tablet (40 mg total) by mouth 2 (two) times daily. (Patient taking differently: Take 40 mg by mouth daily. )     No current facility-administered medications for this visit.    Allergies  Allergen Reactions  . Cephalexin   . Lisinopril Cough  . Oxycodone     itching  .  Penicillins   . Sulfa Drugs Cross Reactors Other (See Comments)    Skin irritation.    Past Medical History  Diagnosis Date  . Hypercholesteremia   . Lupus ([place])   . GERD (gastroesophageal reflux disease)   . Spinal stenosis   . Fibula fracture     right  . Gallstones   . Arthritis   . CAD (coronary artery disease) 05    stents  . RBBB   . Asthmatic bronchitis     Past Surgical History  Procedure Laterality Date  . Coronary angioplasty  2005  . Breast lumpectomy  1970  . Abdominal hysterectomy  1975  . Fibula fracture surgery  2011    Right Ankle  . Colonoscopy w/ biopsies    . Cholecystectomy  07/04/2011    Procedure: LAPAROSCOPIC CHOLECYSTECTOMY WITH INTRAOPERATIVE CHOLANGIOGRAM;  Surgeon: Merrie Roof, MD;  Location: [place];  Service: General;  Laterality: N/A;    History  Smoking status  . Never Smoker   Smokeless tobacco  . Not on file    History  Alcohol Use No    Family History  Problem Relation Age of Onset  . Cancer Father   . Heart attack Mother   . Heart attack Brother   .  Cancer Sister   . Cancer Sister   . Anesthesia problems Neg Hx   . Hypotension Neg Hx   . Malignant hyperthermia Neg Hx   . Pseudochol deficiency Neg Hx     Review of Systems: The review of systems is per the HPI.  All other systems were reviewed and are negative.  Physical Exam: BP 136/80 mmHg  Pulse 78  Ht 5\' 4"  (1.626 m)  Wt 89.223 kg (196 lb 11.2 oz)  BMI 33.75 kg/m2 Patient is alert and in no acute distress. She is obese.  Skin is warm and dry. Color is normal.  HEENT is unremarkable. Normocephalic/atraumatic. PERRL. Sclera are nonicteric. Neck is supple. No masses. No JVD. Lungs are clear. Cardiac exam shows a regular rate and rhythm. Abdomen is soft. Extremities are without edema. Gait and ROM are intact. No gross neurologic deficits noted.  LABORATORY DATA:  Lab Results  Component Value Date   WBC 5.5 06/28/2014   HGB 14.0 06/28/2014   HCT 41.9 06/28/2014     PLT 192 06/28/2014   GLUCOSE 95 06/28/2014   CHOL 180 06/28/2014   TRIG 112 06/28/2014   HDL 63 06/28/2014   LDLCALC 95 06/28/2014   ALT 18 06/28/2014   AST 26 06/28/2014   NA 142 06/28/2014   K 4.8 06/28/2014   CL 104 06/28/2014   CREATININE 0.90 06/28/2014   BUN 13 06/28/2014   CO2 29 06/28/2014   INR 1.07 08/03/2009   HGBA1C 5.4 05/10/2010   Ecg: NSR with RBBB. Rate 78. No change from May 2014.I have personally reviewed and interpreted this study.    Assessment / Plan: 1. CAD - s/p stenting of the LAD and OM in 2005 with Cypher stents. Asymptomatic - normal Myoview in May 2014. Continue with CV risk factor modification. Continue ASA and Plavix. She is interested in having repeat stress test next visit.   2. HTN - BP is well controlled.   3. HLD - on statin. Lipids are satisfactory. LFTs are normal. Repeat labs next visit.  4. Cough- secondary to ACEi. Resolved.   I will follow up in 6 months.

## 2015-03-27 ENCOUNTER — Other Ambulatory Visit (HOSPITAL_COMMUNITY): Payer: Self-pay | Admitting: *Deleted

## 2015-03-28 ENCOUNTER — Ambulatory Visit (HOSPITAL_COMMUNITY)
Admission: RE | Admit: 2015-03-28 | Discharge: 2015-03-28 | Disposition: A | Discharge status: Home / Self Care | Payer: Medicare Other | Source: Ambulatory Visit | Attending: Obstetrics and Gynecology | Admitting: Obstetrics and Gynecology

## 2015-03-28 DIAGNOSIS — M810 Age-related osteoporosis without current pathological fracture: Secondary | ICD-10-CM | POA: Insufficient documentation

## 2015-03-28 MED ORDER — ZOLEDRONIC ACID 5 MG/100ML IV SOLN
5.0000 mg | Freq: Once | INTRAVENOUS | Status: AC
  Administered 2015-03-28: 5 mg via INTRAVENOUS

## 2015-03-28 MED ORDER — ZOLEDRONIC ACID 5 MG/100ML IV SOLN
INTRAVENOUS | Status: AC
  Administered 2015-03-28: 5 mg via INTRAVENOUS
  Filled 2015-03-28: qty 100

## 2015-03-30 ENCOUNTER — Telehealth: Payer: Self-pay | Admitting: Cardiology

## 2015-03-30 NOTE — Telephone Encounter (Signed)
New message   Pt calling for rn   She would not tell me why other than her other doctor put her on a new medication

## 2015-03-30 NOTE — Telephone Encounter (Signed)
Pt was put on losartan 25mg  daily by PCP. Med list updated.   Affirmed OK to take, continue to check BPs at home.

## 2015-07-05 ENCOUNTER — Other Ambulatory Visit: Payer: Self-pay | Admitting: Cardiology

## 2015-07-06 ENCOUNTER — Telehealth: Payer: Self-pay

## 2015-07-06 LAB — BASIC METABOLIC PANEL
BUN: 16 mg/dL (ref 7–25)
CALCIUM: 9.2 mg/dL (ref 8.6–10.4)
CHLORIDE: 106 mmol/L (ref 98–110)
CO2: 24 mmol/L (ref 20–31)
Creat: 0.9 mg/dL — ABNORMAL HIGH (ref 0.60–0.88)
GLUCOSE: 84 mg/dL (ref 65–99)
Potassium: 4.7 mmol/L (ref 3.5–5.3)
Sodium: 143 mmol/L (ref 135–146)

## 2015-07-06 LAB — CBC WITH DIFFERENTIAL/PLATELET
BASOS PCT: 1 %
Basophils Absolute: 58 cells/uL (ref 0–200)
EOS ABS: 232 {cells}/uL (ref 15–500)
Eosinophils Relative: 4 %
HEMATOCRIT: 40.9 % (ref 35.0–45.0)
HEMOGLOBIN: 13.4 g/dL (ref 11.7–15.5)
LYMPHS ABS: 1334 {cells}/uL (ref 850–3900)
LYMPHS PCT: 23 %
MCH: 30.8 pg (ref 27.0–33.0)
MCHC: 32.8 g/dL (ref 32.0–36.0)
MCV: 94 fL (ref 80.0–100.0)
MONO ABS: 754 {cells}/uL (ref 200–950)
MPV: 10 fL (ref 7.5–12.5)
Monocytes Relative: 13 %
NEUTROS PCT: 59 %
Neutro Abs: 3422 cells/uL (ref 1500–7800)
Platelets: 199 10*3/uL (ref 140–400)
RBC: 4.35 MIL/uL (ref 3.80–5.10)
RDW: 14.2 % (ref 11.0–15.0)
WBC: 5.8 10*3/uL (ref 3.8–10.8)

## 2015-07-06 LAB — HEPATIC FUNCTION PANEL
ALT: 14 U/L (ref 6–29)
AST: 22 U/L (ref 10–35)
Albumin: 4 g/dL (ref 3.6–5.1)
Alkaline Phosphatase: 46 U/L (ref 33–130)
BILIRUBIN DIRECT: 0.1 mg/dL (ref <=0.2)
BILIRUBIN INDIRECT: 0.6 mg/dL (ref 0.2–1.2)
Total Bilirubin: 0.7 mg/dL (ref 0.2–1.2)
Total Protein: 6.6 g/dL (ref 6.1–8.1)

## 2015-07-06 LAB — LIPID PANEL
CHOLESTEROL: 174 mg/dL (ref 125–200)
HDL: 66 mg/dL (ref >=46)
LDL Cholesterol: 89 mg/dL (ref <130)
Total CHOL/HDL Ratio: 2.6 Ratio (ref <=5.0)
Triglycerides: 94 mg/dL (ref <150)
VLDL: 19 mg/dL (ref <30)

## 2015-07-06 LAB — VITAMIN D 25 HYDROXY (VIT D DEFICIENCY, FRACTURES): Vit D, 25-Hydroxy: 51 ng/mL (ref 30–100)

## 2015-07-06 NOTE — Telephone Encounter (Signed)
Patient walked in office 07/05/15 requesting Vit D be added to lab work she just had done.[facility] lab called Vit D added.

## 2015-07-10 ENCOUNTER — Encounter: Payer: Self-pay | Admitting: Cardiology

## 2015-07-10 ENCOUNTER — Ambulatory Visit (INDEPENDENT_AMBULATORY_CARE_PROVIDER_SITE_OTHER): Payer: Medicare Other | Admitting: Cardiology

## 2015-07-10 VITALS — BP 142/80 | HR 78 | Ht 64.0 in | Wt 200.0 lb

## 2015-07-10 DIAGNOSIS — E7800 Pure hypercholesterolemia, unspecified: Secondary | ICD-10-CM | POA: Diagnosis not present

## 2015-07-10 DIAGNOSIS — I10 Essential (primary) hypertension: Secondary | ICD-10-CM

## 2015-07-10 DIAGNOSIS — I4510 Unspecified right bundle-branch block: Secondary | ICD-10-CM | POA: Diagnosis not present

## 2015-07-10 DIAGNOSIS — I2510 Atherosclerotic heart disease of native coronary artery without angina pectoris: Secondary | ICD-10-CM

## 2015-07-10 NOTE — Progress Notes (Signed)
Pamela Gonzales Date of Birth: 05-19-1930 Medical Record [id number]  History of Present Illness: Pamela Gonzales is seen back today for follow up of CAD. She has a history of HLD, Lupus, GERD, OA, RBBB and CAD with past stents placed in 2005. She had stenting of the mid LAD and 2nd OM in 2005 with Cypher stents. Last Myoview was in May of 2014 was low risk. She has remained on Plavix.   On follow up today she is feeling very well. No chest pain or dyspnea. She is having a lot of back problems and is currently in a PT program. She is doing water aerobics. She does report one time when she went to the beach and her left ankle and foot were swollen. This resolved and she hasn't had it again. She thinks it may have been related to a bug bite. BP at home controlled [id number] systolic.   Current Outpatient Prescriptions  Medication Sig Dispense Refill  . aspirin 81 MG tablet Take 81 mg by mouth daily.      Marland Kitchen atorvastatin (LIPITOR) 40 MG tablet Take 1 tablet (40 mg total) by mouth daily. 90 tablet 2  . beclomethasone (QVAR) 40 MCG/ACT inhaler Inhale 2 puffs into the lungs daily.    . clopidogrel (PLAVIX) 75 MG tablet Take 1 tablet (75 mg total) by mouth daily. 90 tablet 3  . folic acid (FOLVITE) 0.5 MG tablet Take 0.5 mg by mouth daily.    Marland Kitchen losartan (COZAAR) 25 MG tablet Take 25 mg by mouth daily.    . Misc Natural Products (OSTEO BI-FLEX ADV DOUBLE ST) TABS Take 1 tablet by mouth daily.    . Multiple Vitamins-Minerals (CENTRUM SILVER ULTRA MENS) TABS Take 220 tablets by mouth daily.    Marland Kitchen MYRBETRIQ 50 MG TB24 tablet Take 1 tablet by mouth daily.  2  . NASONEX 50 MCG/ACT nasal spray 2 sprays by Nasal route Daily.    Marland Kitchen NITROSTAT 0.4 MG SL tablet Place 0.4 mg under the tongue as needed.    . pantoprazole (PROTONIX) 40 MG tablet Take 1 tablet (40 mg total) by mouth 2 (two) times daily. (Patient taking differently: Take 40 mg by mouth daily. )     No current facility-administered medications for this visit.      Allergies  Allergen Reactions  . Cephalexin   . Lisinopril Cough  . Oxycodone     itching  . Penicillins   . Sulfa Drugs Cross Reactors Other (See Comments)    Skin irritation.    Past Medical History  Diagnosis Date  . Hypercholesteremia   . Lupus ([place])   . GERD (gastroesophageal reflux disease)   . Spinal stenosis   . Fibula fracture     right  . Gallstones   . Arthritis   . CAD (coronary artery disease) 05    stents  . RBBB   . Asthmatic bronchitis     Past Surgical History  Procedure Laterality Date  . Coronary angioplasty  2005  . Breast lumpectomy  1970  . Abdominal hysterectomy  1975  . Fibula fracture surgery  2011    Right Ankle  . Colonoscopy w/ biopsies    . Cholecystectomy  07/04/2011    Procedure: LAPAROSCOPIC CHOLECYSTECTOMY WITH INTRAOPERATIVE CHOLANGIOGRAM;  Surgeon: Merrie Roof, MD;  Location: [place];  Service: General;  Laterality: N/A;    History  Smoking status  . Never Smoker   Smokeless tobacco  . Not on file  History  Alcohol Use No    Family History  Problem Relation Age of Onset  . Cancer Father   . Heart attack Mother   . Heart attack Brother   . Cancer Sister   . Cancer Sister   . Anesthesia problems Neg Hx   . Hypotension Neg Hx   . Malignant hyperthermia Neg Hx   . Pseudochol deficiency Neg Hx     Review of Systems: The review of systems is per the HPI.  All other systems were reviewed and are negative.  Physical Exam: BP 142/80 mmHg  Pulse 78  Ht 5\' 4"  (1.626 m)  Wt 200 lb (90.719 kg)  BMI 34.31 kg/m2 Patient is alert and in no acute distress. She is obese.  Skin is warm and dry. Color is normal.  HEENT is unremarkable. Normocephalic/atraumatic. PERRL. Sclera are nonicteric. Neck is supple. No masses. No JVD. Lungs are clear. Cardiac exam shows a regular rate and rhythm. Abdomen is soft. Extremities are without edema. Gait and ROM are intact. No gross neurologic deficits noted.  LABORATORY DATA:  Lab  Results  Component Value Date   WBC 5.8 07/05/2015   HGB 13.4 07/05/2015   HCT 40.9 07/05/2015   PLT 199 07/05/2015   GLUCOSE 84 07/05/2015   CHOL 174 07/05/2015   TRIG 94 07/05/2015   HDL 66 07/05/2015   LDLCALC 89 07/05/2015   ALT 14 07/05/2015   AST 22 07/05/2015   NA 143 07/05/2015   K 4.7 07/05/2015   CL 106 07/05/2015   CREATININE 0.90* 07/05/2015   BUN 16 07/05/2015   CO2 24 07/05/2015   INR 1.07 08/03/2009   HGBA1C 5.4 05/10/2010   Ecg: NSR with RBBB. Rate 78. No change from prior.I have personally reviewed and interpreted this study.    Assessment / Plan: 1. CAD - s/p stenting of the LAD and OM in 2005 with Cypher stents. Asymptomatic - normal Myoview in May 2014. Continue with CV risk factor modification. Continue ASA and Plavix. Will update a Lexiscan myoview now.  2. HTN - BP is well controlled.   3. HLD - on statin. Lipids are satisfactory. LFTs are normal.  4. Chronic RBBB since 2014.    I will follow up in 6 months.

## 2015-07-10 NOTE — Patient Instructions (Signed)
We will schedule you for a nuclear stress test  Continue your current therapy   

## 2015-07-12 ENCOUNTER — Telehealth: Payer: Self-pay | Admitting: Cardiology

## 2015-07-12 NOTE — Telephone Encounter (Signed)
New message    The pt wants to speak with the nurse to see if the nurse can pull up the glucose the pt said the blood was done last and the the pt did not see it on "My Chart"

## 2015-07-12 NOTE — Telephone Encounter (Signed)
Returned call to patient no answer.Left message on personal voice mail glucose on 07/05/15 84.

## 2015-07-21 ENCOUNTER — Telehealth (HOSPITAL_COMMUNITY): Payer: Self-pay

## 2015-07-21 NOTE — Telephone Encounter (Signed)
Encounter complete. 

## 2015-07-26 ENCOUNTER — Ambulatory Visit (HOSPITAL_COMMUNITY)
Admission: RE | Admit: 2015-07-26 | Discharge: 2015-07-26 | Disposition: A | Discharge status: Home / Self Care | Payer: Medicare Other | Source: Ambulatory Visit | Attending: Cardiovascular Disease | Admitting: Cardiovascular Disease

## 2015-07-26 DIAGNOSIS — Z6834 Body mass index (BMI) 34.0-34.9, adult: Secondary | ICD-10-CM | POA: Insufficient documentation

## 2015-07-26 DIAGNOSIS — E7800 Pure hypercholesterolemia, unspecified: Secondary | ICD-10-CM | POA: Insufficient documentation

## 2015-07-26 DIAGNOSIS — I2510 Atherosclerotic heart disease of native coronary artery without angina pectoris: Secondary | ICD-10-CM | POA: Insufficient documentation

## 2015-07-26 DIAGNOSIS — I4510 Unspecified right bundle-branch block: Secondary | ICD-10-CM | POA: Diagnosis not present

## 2015-07-26 DIAGNOSIS — E669 Obesity, unspecified: Secondary | ICD-10-CM | POA: Insufficient documentation

## 2015-07-26 DIAGNOSIS — I10 Essential (primary) hypertension: Secondary | ICD-10-CM | POA: Insufficient documentation

## 2015-07-26 LAB — MYOCARDIAL PERFUSION IMAGING
CHL CUP NUCLEAR SDS: 7
CHL CUP NUCLEAR SRS: 2
CHL CUP RESTING HR STRESS: 71 {beats}/min
LV dias vol: 69 mL (ref 46–106)
LV sys vol: 19 mL
Peak HR: 90 {beats}/min
SSS: 7
TID: 1.22

## 2015-07-26 MED ORDER — REGADENOSON 0.4 MG/5ML IV SOLN
0.4000 mg | Freq: Once | INTRAVENOUS | Status: AC
  Administered 2015-07-26: 0.4 mg via INTRAVENOUS

## 2015-07-26 MED ORDER — TECHNETIUM TC 99M TETROFOSMIN IV KIT
29.0000 | PACK | Freq: Once | INTRAVENOUS | Status: AC | PRN
  Administered 2015-07-26: 29 via INTRAVENOUS
  Filled 2015-07-26: qty 29

## 2015-07-26 MED ORDER — AMINOPHYLLINE 25 MG/ML IV SOLN
75.0000 mg | Freq: Once | INTRAVENOUS | Status: AC
  Administered 2015-07-26: 75 mg via INTRAVENOUS

## 2015-07-26 MED ORDER — TECHNETIUM TC 99M TETROFOSMIN IV KIT
10.9000 | PACK | Freq: Once | INTRAVENOUS | Status: AC | PRN
  Administered 2015-07-26: 10.9 via INTRAVENOUS
  Filled 2015-07-26: qty 11

## 2015-07-28 ENCOUNTER — Other Ambulatory Visit: Payer: Self-pay

## 2015-07-28 MED ORDER — ATORVASTATIN CALCIUM 40 MG PO TABS: 40.0000 mg | ORAL_TABLET | Freq: Every day | ORAL | Status: DC

## 2015-08-04 ENCOUNTER — Telehealth: Payer: Self-pay

## 2015-08-04 MED ORDER — ATORVASTATIN CALCIUM 40 MG PO TABS: 40.0000 mg | ORAL_TABLET | Freq: Every day | ORAL | Status: DC

## 2015-08-04 NOTE — Telephone Encounter (Addendum)
Received a call from patient requesting 90 day refill for lipitor.Refill sent to pharmacy.Also requesting last stress test to be sent to PCP.Copy of 07/26/15 myoview sent to [facility].

## 2015-10-27 ENCOUNTER — Other Ambulatory Visit: Payer: Self-pay | Admitting: Cardiology

## 2015-10-27 NOTE — Telephone Encounter (Signed)
Rx(s) sent to pharmacy electronically.  

## 2015-11-15 ENCOUNTER — Other Ambulatory Visit: Payer: Self-pay | Admitting: Family Medicine

## 2015-11-15 DIAGNOSIS — Z1231 Encounter for screening mammogram for malignant neoplasm of breast: Secondary | ICD-10-CM

## 2015-11-15 DIAGNOSIS — R5381 Other malaise: Secondary | ICD-10-CM

## 2015-12-05 ENCOUNTER — Other Ambulatory Visit: Payer: Self-pay | Admitting: Family Medicine

## 2015-12-05 DIAGNOSIS — E2839 Other primary ovarian failure: Secondary | ICD-10-CM

## 2016-01-04 ENCOUNTER — Encounter: Payer: Self-pay | Admitting: Cardiology

## 2016-01-06 NOTE — Progress Notes (Signed)
Pamela Gonzales Date of Birth: [birth year] Medical Record [id number]  History of Present Illness: Pamela Gonzales is seen back today for follow up of CAD. She has a history of HLD, Lupus, GERD, OA, RBBB and CAD with past stents placed in 2005. She had stenting of the mid LAD and 2nd OM in 2005 with Cypher stents. Last Myoview was in June 2017 and was normal. She has remained on Plavix.   On follow up today she is feeling very well. No chest pain or dyspnea. She is still having a lot of back problems and some shoulder tendinitis. She is doing water aerobics.   Current Outpatient Prescriptions  Medication Sig Dispense Refill  . aspirin 81 MG tablet Take 81 mg by mouth daily.      Marland Kitchen atorvastatin (LIPITOR) 40 MG tablet Take 1 tablet (40 mg total) by mouth daily. 90 tablet 3  . beclomethasone (QVAR) 40 MCG/ACT inhaler Inhale 2 puffs into the lungs daily.    . clopidogrel (PLAVIX) 75 MG tablet Take 1 tablet (75 mg total) by mouth daily. 90 tablet 3  . folic acid (FOLVITE) 1 MG tablet TAKE 1/2 TABLET BY MOUTH EVERY DAY 45 tablet 6  . losartan (COZAAR) 25 MG tablet Take 25 mg by mouth daily.    . Misc Natural Products (OSTEO BI-FLEX ADV DOUBLE ST) TABS Take 1 tablet by mouth daily.    . Multiple Vitamins-Minerals (CENTRUM SILVER ULTRA MENS) TABS Take 220 tablets by mouth daily.    Marland Kitchen MYRBETRIQ 50 MG TB24 tablet Take 1 tablet by mouth daily.  2  . NASONEX 50 MCG/ACT nasal spray 2 sprays by Nasal route Daily.    Marland Kitchen NITROSTAT 0.4 MG SL tablet Place 0.4 mg under the tongue as needed.    . pantoprazole (PROTONIX) 40 MG tablet Take 1 tablet (40 mg total) by mouth 2 (two) times daily. (Patient taking differently: Take 40 mg by mouth daily. )     No current facility-administered medications for this visit.     Allergies  Allergen Reactions  . Cephalexin   . Lisinopril Cough  . Oxycodone     itching  . Penicillins   . Sulfa Drugs Cross Reactors Other (See Comments)    Skin irritation.    Past Medical  History:  Diagnosis Date  . Arthritis   . Asthmatic bronchitis   . CAD (coronary artery disease) 05   stents  . Fibula fracture    right  . Gallstones   . GERD (gastroesophageal reflux disease)   . Hypercholesteremia   . Lupus   . RBBB   . Spinal stenosis     Past Surgical History:  Procedure Laterality Date  . ABDOMINAL HYSTERECTOMY  1975  . BREAST LUMPECTOMY  1970  . CHOLECYSTECTOMY  07/04/2011   Procedure: LAPAROSCOPIC CHOLECYSTECTOMY WITH INTRAOPERATIVE CHOLANGIOGRAM;  Surgeon: Merrie Roof, MD;  Location: [place];  Service: General;  Laterality: N/A;  . COLONOSCOPY W/ BIOPSIES    . CORONARY ANGIOPLASTY  2005  . FIBULA FRACTURE SURGERY  2011   Right Ankle    History  Smoking Status  . Never Smoker  Smokeless Tobacco  . Never Used    History  Alcohol Use No    Family History  Problem Relation Age of Onset  . Cancer Father   . Heart attack Mother   . Heart attack Brother   . Cancer Sister   . Cancer Sister   . Anesthesia problems Neg Hx   .  Hypotension Neg Hx   . Malignant hyperthermia Neg Hx   . Pseudochol deficiency Neg Hx     Review of Systems: The review of systems is per the HPI.  All other systems were reviewed and are negative.  Physical Exam: There were no vitals taken for this visit. Patient is alert and in no acute distress. She is obese.  Skin is warm and dry. Color is normal.  HEENT is unremarkable. Normocephalic/atraumatic. PERRL. Sclera are nonicteric. Neck is supple. No masses. No JVD. Lungs are clear. Cardiac exam shows a regular rate and rhythm. Abdomen is soft. Extremities are without edema. Gait and ROM are intact. No gross neurologic deficits noted.  LABORATORY DATA:  Lab Results  Component Value Date   WBC 5.8 07/05/2015   HGB 13.4 07/05/2015   HCT 40.9 07/05/2015   PLT 199 07/05/2015   GLUCOSE 84 07/05/2015   CHOL 174 07/05/2015   TRIG 94 07/05/2015   HDL 66 07/05/2015   LDLCALC 89 07/05/2015   ALT 14 07/05/2015   AST 22  07/05/2015   NA 143 07/05/2015   K 4.7 07/05/2015   CL 106 07/05/2015   CREATININE 0.90 (H) 07/05/2015   BUN 16 07/05/2015   CO2 24 07/05/2015   INR 1.07 08/03/2009   HGBA1C 5.4 05/10/2010    Myoview study 07/26/15:Study Highlights    The left ventricular ejection fraction is hyperdynamic (>65%).  Nuclear stress EF: 72%.  There was no ST segment deviation noted during stress.  The study is normal.  This is a low risk study.   This is a normal pharmacologic nuclear study with no evidence of prior infarct or ischemia. Normal LVEF.       Assessment / Plan: 1. CAD - s/p stenting of the LAD and OM in 2005 with Cypher stents. Asymptomatic - normal Myoview in June 2017. Continue with CV risk factor modification. Continue ASA and Plavix.   2. HTN - BP is well controlled.   3. HLD - on statin. Lipids are satisfactory. LFTs are normal.  4. Chronic RBBB since 2014.    I will follow up in 6 months.

## 2016-01-08 ENCOUNTER — Ambulatory Visit
Admission: RE | Admit: 2016-01-08 | Discharge: 2016-01-08 | Disposition: A | Discharge status: Home / Self Care | Payer: Medicare Other | Source: Ambulatory Visit | Attending: Family Medicine | Admitting: Family Medicine

## 2016-01-08 DIAGNOSIS — Z1231 Encounter for screening mammogram for malignant neoplasm of breast: Secondary | ICD-10-CM

## 2016-01-08 DIAGNOSIS — E2839 Other primary ovarian failure: Secondary | ICD-10-CM

## 2016-01-09 ENCOUNTER — Ambulatory Visit (INDEPENDENT_AMBULATORY_CARE_PROVIDER_SITE_OTHER): Payer: Medicare Other | Admitting: Cardiology

## 2016-01-09 ENCOUNTER — Encounter: Payer: Self-pay | Admitting: Cardiology

## 2016-01-09 VITALS — BP 139/86 | HR 88 | Ht 66.0 in | Wt 202.6 lb

## 2016-01-09 DIAGNOSIS — I10 Essential (primary) hypertension: Secondary | ICD-10-CM | POA: Diagnosis not present

## 2016-01-09 DIAGNOSIS — E7800 Pure hypercholesterolemia, unspecified: Secondary | ICD-10-CM

## 2016-01-09 DIAGNOSIS — I2510 Atherosclerotic heart disease of native coronary artery without angina pectoris: Secondary | ICD-10-CM

## 2016-01-09 NOTE — Patient Instructions (Signed)
Continue your current therapy  I will see you in 6 months.   

## 2016-01-10 ENCOUNTER — Ambulatory Visit: Payer: Medicare Other

## 2016-01-10 ENCOUNTER — Other Ambulatory Visit: Payer: Medicare Other

## 2016-01-26 ENCOUNTER — Other Ambulatory Visit: Payer: Medicare Other

## 2016-01-26 ENCOUNTER — Ambulatory Visit: Payer: Medicare Other

## 2016-02-29 ENCOUNTER — Telehealth: Payer: Self-pay | Admitting: Cardiology

## 2016-02-29 NOTE — Telephone Encounter (Signed)
Pt notified that Martinique is not in the office and she states that she will await call back

## 2016-02-29 NOTE — Telephone Encounter (Signed)
New message      It is ok to take tumeric?  Pt states that her orthopedic doctor suggested that she take it if it is ok with Dr Martinique.  Please call

## 2016-02-29 NOTE — Telephone Encounter (Signed)
Returned call to patient left message on personal voice mail Turmeric not recommended with Plavix and Aspirin.

## 2016-02-29 NOTE — Telephone Encounter (Signed)
Turmeric not recommended with ASA and Plavix.

## 2016-03-22 ENCOUNTER — Telehealth: Payer: Self-pay | Admitting: Cardiology

## 2016-03-22 MED ORDER — CLOPIDOGREL BISULFATE 75 MG PO TABS
75.0000 mg | ORAL_TABLET | Freq: Every day | ORAL | 1 refills | Status: DC
Start: 2016-03-22 — End: 2016-10-05

## 2016-03-22 MED ORDER — CLOPIDOGREL BISULFATE 75 MG PO TABS: 75.0000 mg | ORAL_TABLET | Freq: Every day | ORAL | 1 refills | Status: DC

## 2016-03-22 NOTE — Telephone Encounter (Signed)
Refill sent to the pharmacy electronically.  

## 2016-03-22 NOTE — Telephone Encounter (Signed)
°  New Prob   *STAT* If patient is at the pharmacy, call can be transferred to refill team.   1. Which medications need to be refilled? (please list name of each medication and dose if known)  clopidogrel (PLAVIX) 75 MG tablet  2. Which pharmacy/location (including street and city if local pharmacy) is medication to be sent to? [facility]  3. Do they need a 30 day or 90 day supply? 90 days

## 2016-04-23 ENCOUNTER — Telehealth: Payer: Self-pay | Admitting: Cardiology

## 2016-04-23 NOTE — Telephone Encounter (Signed)
Pt wants to know if she will need lab work before her 09-11-14 appt?

## 2016-04-24 NOTE — Telephone Encounter (Signed)
Returned call to patient.Advised Dr.Jordan out office this week.I will check with him next week about lab work and call you back.

## 2016-04-29 ENCOUNTER — Other Ambulatory Visit: Payer: Self-pay

## 2016-04-29 DIAGNOSIS — I2510 Atherosclerotic heart disease of native coronary artery without angina pectoris: Secondary | ICD-10-CM

## 2016-04-29 DIAGNOSIS — I10 Essential (primary) hypertension: Secondary | ICD-10-CM

## 2016-04-29 DIAGNOSIS — E7800 Pure hypercholesterolemia, unspecified: Secondary | ICD-10-CM

## 2016-05-02 NOTE — Telephone Encounter (Signed)
Fasting lab orders( bmet,lipid,hepatic panels,cbc) mailed to patient to have done before appointment with Dr.Jordan 07/11/16.

## 2016-05-22 ENCOUNTER — Other Ambulatory Visit: Payer: Self-pay | Admitting: Gastroenterology

## 2016-05-22 DIAGNOSIS — R1013 Epigastric pain: Secondary | ICD-10-CM

## 2016-05-29 ENCOUNTER — Ambulatory Visit
Admission: RE | Admit: 2016-05-29 | Discharge: 2016-05-29 | Disposition: A | Discharge status: Home / Self Care | Payer: Medicare Other | Source: Ambulatory Visit | Attending: Gastroenterology | Admitting: Gastroenterology

## 2016-05-29 ENCOUNTER — Other Ambulatory Visit: Payer: Self-pay | Admitting: Gastroenterology

## 2016-05-29 ENCOUNTER — Telehealth: Payer: Self-pay | Admitting: Cardiovascular Disease

## 2016-05-29 DIAGNOSIS — R1013 Epigastric pain: Secondary | ICD-10-CM

## 2016-05-29 NOTE — Telephone Encounter (Signed)
Returned call to patient she stated she is scheduled for endoscopy 06/11/16 and she wants to ask Dr.Jordan if ok to hold Plavix 7 days prior.Message sent to Sharon for advice.

## 2016-05-29 NOTE — Telephone Encounter (Signed)
Please call,when you have time.

## 2016-05-29 NOTE — Telephone Encounter (Signed)
Yes may hold Plavix for 7 days for endoscopic procedure.   Kristine Tiley [place] MD, [facility]

## 2016-05-31 NOTE — Telephone Encounter (Signed)
Returned call to patient left message on personal voice mail Dr.Jordan's recommendations. 

## 2016-06-11 ENCOUNTER — Ambulatory Visit (HOSPITAL_COMMUNITY): Payer: Medicare Other | Admitting: Anesthesiology

## 2016-06-11 ENCOUNTER — Encounter (HOSPITAL_COMMUNITY): Payer: Self-pay | Admitting: Anesthesiology

## 2016-06-11 ENCOUNTER — Ambulatory Visit (HOSPITAL_COMMUNITY)
Admission: RE | Admit: 2016-06-11 | Discharge: 2016-06-11 | Disposition: A | Discharge status: Home / Self Care | Payer: Medicare Other | Source: Ambulatory Visit | Attending: Gastroenterology | Admitting: Gastroenterology

## 2016-06-11 ENCOUNTER — Encounter (HOSPITAL_COMMUNITY)
Admission: RE | Disposition: A | Discharge status: Home / Self Care | Payer: Self-pay | Source: Ambulatory Visit | Attending: Gastroenterology

## 2016-06-11 DIAGNOSIS — R110 Nausea: Secondary | ICD-10-CM | POA: Insufficient documentation

## 2016-06-11 DIAGNOSIS — Z7902 Long term (current) use of antithrombotics/antiplatelets: Secondary | ICD-10-CM | POA: Insufficient documentation

## 2016-06-11 DIAGNOSIS — I10 Essential (primary) hypertension: Secondary | ICD-10-CM | POA: Insufficient documentation

## 2016-06-11 DIAGNOSIS — K317 Polyp of stomach and duodenum: Secondary | ICD-10-CM | POA: Insufficient documentation

## 2016-06-11 DIAGNOSIS — Z7982 Long term (current) use of aspirin: Secondary | ICD-10-CM | POA: Diagnosis not present

## 2016-06-11 DIAGNOSIS — K449 Diaphragmatic hernia without obstruction or gangrene: Secondary | ICD-10-CM | POA: Diagnosis not present

## 2016-06-11 DIAGNOSIS — R1013 Epigastric pain: Secondary | ICD-10-CM | POA: Diagnosis present

## 2016-06-11 DIAGNOSIS — I2510 Atherosclerotic heart disease of native coronary artery without angina pectoris: Secondary | ICD-10-CM | POA: Diagnosis not present

## 2016-06-11 DIAGNOSIS — Z955 Presence of coronary angioplasty implant and graft: Secondary | ICD-10-CM | POA: Diagnosis not present

## 2016-06-11 DIAGNOSIS — K219 Gastro-esophageal reflux disease without esophagitis: Secondary | ICD-10-CM | POA: Insufficient documentation

## 2016-06-11 HISTORY — PX: ESOPHAGOGASTRODUODENOSCOPY (EGD) WITH PROPOFOL: SHX5813

## 2016-06-11 SURGERY — ESOPHAGOGASTRODUODENOSCOPY (EGD) WITH PROPOFOL
Anesthesia: Monitor Anesthesia Care

## 2016-06-11 MED ORDER — LACTATED RINGERS IV SOLN
INTRAVENOUS | Status: DC
Start: 2016-06-11 — End: 2016-06-11
  Administered 2016-06-11: 09:00:00 via INTRAVENOUS

## 2016-06-11 MED ORDER — LACTATED RINGERS IV SOLN
INTRAVENOUS | Status: DC | PRN
  Administered 2016-06-11: 08:00:00 via INTRAVENOUS

## 2016-06-11 MED ORDER — PROPOFOL 500 MG/50ML IV EMUL
INTRAVENOUS | Status: DC | PRN
  Administered 2016-06-11: 50 mg via INTRAVENOUS

## 2016-06-11 MED ORDER — PROPOFOL 500 MG/50ML IV EMUL
INTRAVENOUS | Status: DC | PRN
  Administered 2016-06-11: 100 ug/kg/min via INTRAVENOUS

## 2016-06-11 MED ORDER — SODIUM CHLORIDE 0.9 % IV SOLN: INTRAVENOUS | Status: DC

## 2016-06-11 MED ORDER — PROPOFOL 10 MG/ML IV BOLUS
INTRAVENOUS | Status: AC
  Filled 2016-06-11: qty 20

## 2016-06-11 SURGICAL SUPPLY — 14 items

## 2016-06-11 NOTE — Transfer of Care (Signed)
Immediate Anesthesia Transfer of Care Note  Patient: Pamela Gonzales  Procedure(s) Performed: Procedure(s): ESOPHAGOGASTRODUODENOSCOPY (EGD) WITH PROPOFOL (N/A)  Patient Location: PACU  Anesthesia Type:MAC  Level of Consciousness:  sedated, patient cooperative and responds to stimulation  Airway & Oxygen Therapy:Patient Spontanous Breathing and Patient connected to face mask oxgen  Post-op Assessment:  Report given to PACU RN and Post -op Vital signs reviewed and stable  Post vital signs:  Reviewed and stable  Last Vitals:  Vitals:   06/11/16 0851  BP: (!) 182/70  Pulse: 76  Resp: 16  Temp: 73.4 C    Complications: No apparent anesthesia complications

## 2016-06-11 NOTE — Anesthesia Preprocedure Evaluation (Addendum)
Anesthesia Evaluation  Patient identified by MRN, date of birth, ID band Patient awake    Reviewed: Allergy & Precautions, H&P , NPO status , Patient's Chart, lab work & pertinent test results  History of Anesthesia Complications Negative for: history of anesthetic complications  Airway Mallampati: I  TM Distance: >3 FB Neck ROM: Full    Dental  (+) Teeth Intact, Dental Advisory Given   Pulmonary neg pulmonary ROS,    Pulmonary exam normal        Cardiovascular hypertension, Pt. on medications + CAD and + Cardiac Stents  Normal cardiovascular exam Rhythm:Regular Rate:Normal     Neuro/Psych    GI/Hepatic Neg liver ROS, GERD  Medicated and Controlled,  Endo/Other  negative endocrine ROS  Renal/GU negative Renal ROS     Musculoskeletal   Abdominal   Peds  Hematology   Anesthesia Other Findings   Reproductive/Obstetrics                             Anesthesia Physical  Anesthesia Plan  ASA: III  Anesthesia Plan: MAC   Post-op Pain Management:    Induction:   Airway Management Planned: Natural Airway and Simple Face Mask  Additional Equipment:   Intra-op Plan:   Post-operative Plan:   Informed Consent: I have reviewed the patients History and Physical, chart, labs and discussed the procedure including the risks, benefits and alternatives for the proposed anesthesia with the patient or authorized representative who has indicated his/her understanding and acceptance.   Dental advisory given  Plan Discussed with: CRNA, Anesthesiologist and Surgeon  Anesthesia Plan Comments:         Anesthesia Quick Evaluation

## 2016-06-11 NOTE — Op Note (Signed)
[facility] Patient Name: Pamela Gonzales Procedure Date: 06/11/2016 MRN: [id number] Attending MD: Garlan Fair , MD Date of Birth: [birth year] CSN: [id number] Age: 83 Admit Type: Outpatient Procedure:                Upper GI endoscopy Indications:              Epigastric abdominal pain, Nausea Providers:                Garlan Fair, MD, Zenon Mayo, RN, Corliss Parish, Technician Referring MD:              Medicines:                Propofol per Anesthesia Complications:            No immediate complications. Estimated Blood Loss:     Estimated blood loss was minimal. Procedure:                Pre-Anesthesia Assessment:                           - Prior to the procedure, a History and Physical                            was performed, and patient medications and                            allergies were reviewed. The patient's tolerance of                            previous anesthesia was also reviewed. The risks                            and benefits of the procedure and the sedation                            options and risks were discussed with the patient.                            All questions were answered, and informed consent                            was obtained. Prior Anticoagulants: The patient has                            taken Plavix (clopidogrel), last dose was 7 days                            prior to procedure. ASA Grade Assessment: II - A                            patient with mild systemic disease. After reviewing  the risks and benefits, the patient was deemed in                            satisfactory condition to undergo the procedure.                           After obtaining informed consent, the endoscope was                            passed under direct vision. Throughout the                            procedure, the patient's blood pressure, pulse, and          oxygen saturations were monitored continuously. The                            EG-[id number] ([id number]) scope was introduced through the                            mouth, and advanced to the second part of duodenum.                            The upper GI endoscopy was accomplished without                            difficulty. The patient tolerated the procedure                            well. Scope In: Scope Out: Findings:      The Z-line was regular and was found 35 cm from the incisors.      The examined esophagus was normal.      Multiple 3 to 5 mm sessile benign fundic gland polyps were found in the       gastric body and fundus. Biopsies were taken with a cold forceps for       histology. There was a large complex hiatal hernia with Lysbeth Galas gastric       linear erosions in the proximal gastric body without signs of bleeding.      The examined duodenum was normal. Impression:               - Z-line regular, 35 cm from the incisors.                           - Normal esophagus.                           - Multiple gastric polyps. Biopsied.                           - Normal examined duodenum. Moderate Sedation:      N/A- Per Anesthesia Care Recommendation:           - Patient has a contact number available for  emergencies. The signs and symptoms of potential                            delayed complications were discussed with the                            patient. Return to normal activities tomorrow.                            Written discharge instructions were provided to the                            patient.                           - Await pathology results.                           - Resume previous diet.                           - Continue present medications. Procedure Code(s):        --- Professional ---                           [phone], Esophagogastroduodenoscopy, flexible,                            transoral; with biopsy, single or  multiple Diagnosis Code(s):        --- Professional ---                           K31.7, Polyp of stomach and duodenum                           R10.13, Epigastric pain                           R11.0, Nausea CPT copyright 2016 American Medical Association. All rights reserved. The codes documented in this report are preliminary and upon coder review may  be revised to meet current compliance requirements. Earle Gell, MD Garlan Fair, MD 06/11/2016 10:11:33 AM This report has been signed electronically. Number of Addenda: 0

## 2016-06-11 NOTE — Anesthesia Postprocedure Evaluation (Addendum)
Anesthesia Post Note  Patient: [name]  Procedure(s) Performed: Procedure(s) (LRB): ESOPHAGOGASTRODUODENOSCOPY (EGD) WITH PROPOFOL (N/A)  Patient location during evaluation: Endoscopy Anesthesia Type: MAC Level of consciousness: awake and alert Pain management: pain level controlled Vital Signs Assessment: post-procedure vital signs reviewed and stable Respiratory status: spontaneous breathing and respiratory function stable Cardiovascular status: stable Anesthetic complications: no       Last Vitals:  Vitals:   06/11/16 1030 06/11/16 1034  BP: (!) 186/90 (!) 180/78  Pulse: 70 72  Resp: 12 18  Temp:      Last Pain:  Vitals:   06/11/16 1008  TempSrc: Oral                 Benard Minturn DANIEL

## 2016-06-11 NOTE — H&P (Signed)
Problem: Epigastric discomfort and nausea on chronic aspirin and Plavix therapy. 02/22/2011 normal screening colonoscopy was performed. 05/29/2016 barium esophagram with tablet and upper GI x-ray series showed esophageal dysmotility, moderate size hiatal hernia with complex morphology, and distal esophageal fold irregularity. The barium tablet traversed the esophagus and entered the stomach without obstruction.  History: The patient is an 82 year old female born 08/10/1950. She is scheduled to undergo diagnostic esophagogastroduodenoscopy to evaluate nausea and epigastric discomfort on aspirin and Plavix therapy. She stopped taking Plavix one week ago. She underwent a normal screening colonoscopy in January 2013.  Past medical history: Osteoarthritis. Coronary artery disease. Osteoporosis. Systemic lupus erythematosus. Cholecystectomy to treat gallstones. Right ankle fracture surgery. Bilateral cataract surgery. Benign breast biopsy.  Medication allergies: Oxycodone with acetaminophen. Tramadol. Sulfa drugs. Lisinopril. Keflex.  Exam: The patient is alert and lying comfortably on the endoscopy stretcher. Abdomen is soft and nontender to palpation. Lungs are clear to auscultation. Cardiac exam reveals a regular rhythm.  Plan: Proceed with diagnostic esophagogastroduodenoscopy.

## 2016-06-11 NOTE — Discharge Instructions (Signed)

## 2016-06-12 ENCOUNTER — Encounter (HOSPITAL_COMMUNITY): Payer: Self-pay | Admitting: Gastroenterology

## 2016-06-29 NOTE — Addendum Note (Signed)
Addendum  created 06/29/16 1008 by Duane Boston, MD   Sign clinical note

## 2016-07-03 ENCOUNTER — Other Ambulatory Visit: Payer: Self-pay | Admitting: Gastroenterology

## 2016-07-08 LAB — BASIC METABOLIC PANEL
BUN: 13 mg/dL (ref 7–25)
CHLORIDE: 107 mmol/L (ref 98–110)
CO2: 23 mmol/L (ref 20–31)
CREATININE: 0.89 mg/dL — AB (ref 0.60–0.88)
Calcium: 9.3 mg/dL (ref 8.6–10.4)
GLUCOSE: 98 mg/dL (ref 65–99)
Potassium: 4.3 mmol/L (ref 3.5–5.3)
Sodium: 142 mmol/L (ref 135–146)

## 2016-07-08 LAB — HEPATIC FUNCTION PANEL
ALBUMIN: 3.6 g/dL (ref 3.6–5.1)
ALK PHOS: 47 U/L (ref 33–130)
ALT: 16 U/L (ref 6–29)
AST: 25 U/L (ref 10–35)
BILIRUBIN DIRECT: 0.2 mg/dL (ref <=0.2)
BILIRUBIN TOTAL: 0.8 mg/dL (ref 0.2–1.2)
Indirect Bilirubin: 0.6 mg/dL (ref 0.2–1.2)
Total Protein: 6.4 g/dL (ref 6.1–8.1)

## 2016-07-08 LAB — CBC WITH DIFFERENTIAL/PLATELET
BASOS PCT: 1 %
Basophils Absolute: 59 cells/uL (ref 0–200)
EOS PCT: 2 %
Eosinophils Absolute: 118 cells/uL (ref 15–500)
HCT: 39.3 % (ref 35.0–45.0)
HEMOGLOBIN: 12.5 g/dL (ref 11.7–15.5)
LYMPHS ABS: 1062 {cells}/uL (ref 850–3900)
LYMPHS PCT: 18 %
MCH: 29 pg (ref 27.0–33.0)
MCHC: 31.8 g/dL — AB (ref 32.0–36.0)
MCV: 91.2 fL (ref 80.0–100.0)
MPV: 10.3 fL (ref 7.5–12.5)
Monocytes Absolute: 590 cells/uL (ref 200–950)
Monocytes Relative: 10 %
NEUTROS PCT: 69 %
Neutro Abs: 4071 cells/uL (ref 1500–7800)
Platelets: 224 10*3/uL (ref 140–400)
RBC: 4.31 MIL/uL (ref 3.80–5.10)
RDW: 14.9 % (ref 11.0–15.0)
WBC: 5.9 10*3/uL (ref 3.8–10.8)

## 2016-07-08 LAB — LIPID PANEL
Cholesterol: 172 mg/dL (ref <200)
HDL: 59 mg/dL (ref >50)
LDL Cholesterol: 91 mg/dL (ref <100)
Total CHOL/HDL Ratio: 2.9 Ratio (ref <5.0)
Triglycerides: 111 mg/dL (ref <150)
VLDL: 22 mg/dL (ref <30)

## 2016-07-09 ENCOUNTER — Ambulatory Visit (HOSPITAL_COMMUNITY)
Admission: RE | Admit: 2016-07-09 | Discharge: 2016-07-09 | Disposition: A | Discharge status: Home / Self Care | Payer: Medicare Other | Source: Ambulatory Visit | Attending: Gastroenterology | Admitting: Gastroenterology

## 2016-07-09 ENCOUNTER — Encounter (HOSPITAL_COMMUNITY)
Admission: RE | Disposition: A | Discharge status: Home / Self Care | Payer: Self-pay | Source: Ambulatory Visit | Attending: Gastroenterology

## 2016-07-09 ENCOUNTER — Encounter (HOSPITAL_COMMUNITY): Payer: Self-pay | Admitting: *Deleted

## 2016-07-09 DIAGNOSIS — M329 Systemic lupus erythematosus, unspecified: Secondary | ICD-10-CM | POA: Diagnosis not present

## 2016-07-09 DIAGNOSIS — K5900 Constipation, unspecified: Secondary | ICD-10-CM | POA: Insufficient documentation

## 2016-07-09 DIAGNOSIS — D128 Benign neoplasm of rectum: Secondary | ICD-10-CM | POA: Diagnosis not present

## 2016-07-09 DIAGNOSIS — M1990 Unspecified osteoarthritis, unspecified site: Secondary | ICD-10-CM | POA: Diagnosis not present

## 2016-07-09 DIAGNOSIS — Z9049 Acquired absence of other specified parts of digestive tract: Secondary | ICD-10-CM | POA: Diagnosis not present

## 2016-07-09 DIAGNOSIS — R1030 Lower abdominal pain, unspecified: Secondary | ICD-10-CM | POA: Diagnosis not present

## 2016-07-09 DIAGNOSIS — I2510 Atherosclerotic heart disease of native coronary artery without angina pectoris: Secondary | ICD-10-CM | POA: Diagnosis not present

## 2016-07-09 DIAGNOSIS — K449 Diaphragmatic hernia without obstruction or gangrene: Secondary | ICD-10-CM | POA: Diagnosis not present

## 2016-07-09 HISTORY — PX: FLEXIBLE SIGMOIDOSCOPY: SHX5431

## 2016-07-09 SURGERY — SIGMOIDOSCOPY, FLEXIBLE
Anesthesia: Moderate Sedation

## 2016-07-09 MED ORDER — FLEET ENEMA 7-19 GM/118ML RE ENEM
ENEMA | RECTAL | Status: AC
  Filled 2016-07-09: qty 1

## 2016-07-09 MED ORDER — FLEET ENEMA 7-19 GM/118ML RE ENEM
1.0000 | ENEMA | Freq: Once | RECTAL | Status: AC
  Administered 2016-07-09: 1 via RECTAL

## 2016-07-09 NOTE — Discharge Instructions (Signed)
Flexible Sigmoidoscopy, Care After  This sheet gives you information about how to care for yourself after your procedure. Your health care provider may also give you more specific instructions. If you have problems or questions, contact your health care provider.  What can I expect after the procedure?  After the procedure, it is common to have:  · Abdominal cramping or pain.  · Bloating.  · A small amount of rectal bleeding if you had a biopsy.    Follow these instructions at home:  · Take over-the-counter and prescription medicines only as told by your health care provider.  · Do not drive for 24 hours if you received a medicine to help you relax (sedative).  · Keep all follow-up visits as told by your health care provider. This is important.  Contact a health care provider if:  · You have abdominal pain or cramping that gets worse or is not helped with medicine.  · You continue to have small amounts of rectal bleeding after 24 hours.  · You have nausea or vomiting.  · You feel weak or dizzy.  · You have a fever.  Get help right away if:  · You pass large blood clots or see a large amount of blood in the toilet after having a bowel movement.  · You have nausea or vomiting for more than 24 hours after the procedure.  This information is not intended to replace advice given to you by your health care provider. Make sure you discuss any questions you have with your health care provider.  Document Released: 01/19/2013 Document Revised: 08/04/2015 Document Reviewed: 04/15/2015  Elsevier Interactive Patient Education © 2018 [employer]

## 2016-07-09 NOTE — H&P (Signed)
Procedure: Unsedated flexible proctosigmoidoscopy to evaluate constipation. 06/11/2016 esophagogastroduodenoscopy showed a large complex hiatal hernia with gastric Lysbeth Galas) erosions and benign fundic gland gastric polyps. Normal screening colonoscopies were performed in January 2013 and September 2008.  History: The patient is an 83 year old female born 08-17-1930. She consumes prune juice daily to have a daily bowel movement. In order to pass a bowel movement, she has to strain excessively associated with lower abdominal discomfort but no gastrointestinal bleeding.  She is scheduled to undergo diagnostic flexible proctosigmoidoscopy today.  Past medical history: Osteoarthritis. Coronary artery disease. Systemic lupus erythematosus. Osteoporosis. Cholecystectomy treat gallstones. Bilateral cataract surgery.  Exam: The patient is alert and lying comfortably on the endoscopy stretcher. Abdomen is soft and nontender to palpation. Lungs are clear to auscultation. Cardiac exam reveals a regular rhythm.  Plan: Proceed with diagnostic flexible proctosigmoidoscopy

## 2016-07-09 NOTE — Op Note (Signed)
[facility] Patient Name: Pamela Gonzales Procedure Date: 07/09/2016 MRN: [id number] Attending MD: Garlan Fair , MD Date of Birth: April 03, 1930 CSN: [id number] Age: 83 Admit Type: Outpatient Procedure:                Flexible Sigmoidoscopy Indications:              Lower abdominal pain, Constipation Providers:                Garlan Fair, MD, Burtis Junes, RN, Tinnie Gens,                            Technician Referring MD:              Medicines:                None Complications:            No immediate complications. Estimated Blood Loss:     Estimated blood loss was minimal. Procedure:                Pre-Anesthesia Assessment:                           - Prior to the procedure, a History and Physical                            was performed, and patient medications and                            allergies were reviewed. The patient's tolerance of                            previous anesthesia was also reviewed. The risks                            and benefits of the procedure and the sedation                            options and risks were discussed with the patient.                            All questions were answered, and informed consent                            was obtained. Prior Anticoagulants: The patient has                            taken Plavix (clopidogrel), last dose was day of                            procedure. ASA Grade Assessment: II - A patient                            with mild systemic disease. After reviewing the  risks and benefits, the patient was deemed in                            satisfactory condition to undergo the procedure.                           After obtaining informed consent, the scope was                            passed under direct vision. The EG-[id number])                            scope was introduced through the anus and advanced                            to the the sigmoid  colon. The flexible                            sigmoidoscopy was accomplished without difficulty.                            The patient tolerated the procedure well. The                            quality of the bowel preparation was adequate. Scope In: Scope Out: Findings:      The perianal and digital rectal examinations were normal.      A 3 mm polyp was found in the rectum. The polyp was sessile. The polyp       was removed with a cold biopsy forceps. Resection and retrieval were       complete.      The exam was otherwise without abnormality. Impression:               - One 3 mm polyp in the rectum, removed with a cold                            biopsy forceps. Resected and retrieved.                           - The examination was otherwise normal. Moderate Sedation:      No sedation Recommendation:           - Patient has a contact number available for                            emergencies. The signs and symptoms of potential                            delayed complications were discussed with the                            patient. Return to normal activities tomorrow.                            Written discharge instructions  were provided to the                            patient.                           - Await pathology results. Procedure Code(s):        --- Professional ---                           [phone], Sigmoidoscopy, flexible; with biopsy, single                            or multiple Diagnosis Code(s):        --- Professional ---                           K62.1, Rectal polyp                           R10.30, Lower abdominal pain, unspecified                           K59.00, Constipation, unspecified CPT copyright 2016 American Medical Association. All rights reserved. The codes documented in this report are preliminary and upon coder review may  be revised to meet current compliance requirements. Earle Gell, MD Garlan Fair, MD 07/09/2016 3:26:20  PM This report has been signed electronically. Number of Addenda: 0

## 2016-07-10 ENCOUNTER — Encounter (HOSPITAL_COMMUNITY): Payer: Self-pay | Admitting: Gastroenterology

## 2016-07-10 NOTE — Progress Notes (Signed)
Pamela Gonzales Date of Birth: 1930/07/09 Medical Record #[id number]  History of Present Illness: Pamela Gonzales is seen back today for follow up of CAD. She has a history of HLD, Lupus, GERD, OA, RBBB and CAD with past stents placed in 2005. She had stenting of the mid LAD and 2nd OM in 2005 with Cypher stents. Last Myoview was in June 2017 and was normal. She has remained on Plavix.   She was seen by GI in May for abdominal pain. Upper EGD showed multiple gastric polyps that were biopsed. Flexible sigmoidoscopy showed one some 3 mm polyp. UGI showed a moderate to large hiatal hernia and nonspecific esophageal dysmotility disorder.   On follow up today she is feeling very well.  No chest pain or dyspnea.  She is doing water aerobics 3x per week. Goes to gym as well. Mostly limited by rectal issues. BP readings at home OK.   Current Outpatient Prescriptions  Medication Sig Dispense Refill  . aspirin 81 MG tablet Take 81 mg by mouth daily.      Marland Kitchen atorvastatin (LIPITOR) 40 MG tablet Take 1 tablet (40 mg total) by mouth daily. 90 tablet 3  . beclomethasone (QVAR) 40 MCG/ACT inhaler Inhale 1 puff into the lungs daily.     . Calcium Citrate-Vitamin D (CALCIUM + D PO) Take 1 tablet by mouth 2 (two) times daily.    . clopidogrel (PLAVIX) 75 MG tablet Take 1 tablet (75 mg total) by mouth daily. 90 tablet 1  . dextromethorphan-guaiFENesin (MUCINEX DM) 30-600 MG 12hr tablet Take 2 tablets by mouth daily.    . folic acid (FOLVITE) 1 MG tablet TAKE 1/2 TABLET BY MOUTH EVERY DAY 45 tablet 6  . losartan (COZAAR) 25 MG tablet Take 25 mg by mouth daily.    . Multiple Vitamins-Minerals (MULTIVITAMIN WITH MINERALS) tablet Take 1 tablet by mouth daily.    Marland Kitchen MYRBETRIQ 50 MG TB24 tablet Take 1 tablet by mouth daily.  2  . NASONEX 50 MCG/ACT nasal spray 2 sprays by Nasal route Daily.    Marland Kitchen NITROSTAT 0.4 MG SL tablet Place 0.4 mg under the tongue every 5 (five) minutes as needed for chest pain.     . pantoprazole  (PROTONIX) 40 MG tablet Take 40 mg by mouth daily.     No current facility-administered medications for this visit.     Allergies  Allergen Reactions  . Cephalexin   . Lisinopril Cough  . Oxycodone     itching  . Penicillins   . Sulfa Drugs Cross Reactors Other (See Comments)    Skin irritation.    Past Medical History:  Diagnosis Date  . Arthritis   . Asthmatic bronchitis   . CAD (coronary artery disease) 05   stents  . Fibula fracture    right  . Gallstones   . GERD (gastroesophageal reflux disease)   . Hypercholesteremia   . Lupus   . RBBB   . Spinal stenosis     Past Surgical History:  Procedure Laterality Date  . ABDOMINAL HYSTERECTOMY  1975  . BREAST LUMPECTOMY  1970  . CHOLECYSTECTOMY  07/04/2011   Procedure: LAPAROSCOPIC CHOLECYSTECTOMY WITH INTRAOPERATIVE CHOLANGIOGRAM;  Surgeon: Merrie Roof, MD;  Location: [place];  Service: General;  Laterality: N/A;  . COLONOSCOPY W/ BIOPSIES    . CORONARY ANGIOPLASTY  2005  . ESOPHAGOGASTRODUODENOSCOPY (EGD) WITH PROPOFOL N/A 06/11/2016   Procedure: ESOPHAGOGASTRODUODENOSCOPY (EGD) WITH PROPOFOL;  Surgeon: Garlan Fair, MD;  Location: [facility];  Service: Endoscopy;  Laterality: N/A;  . FIBULA FRACTURE SURGERY  2011   Right Ankle  . FLEXIBLE SIGMOIDOSCOPY N/A 07/09/2016   Procedure: FLEXIBLE SIGMOIDOSCOPY;  Surgeon: Garlan Fair, MD;  Location: [facility];  Service: Endoscopy;  Laterality: N/A;    History  Smoking Status  . Never Smoker  Smokeless Tobacco  . Never Used    History  Alcohol Use No    Family History  Problem Relation Age of Onset  . Cancer Father   . Heart attack Mother   . Heart attack Brother   . Cancer Sister   . Cancer Sister   . Anesthesia problems Neg Hx   . Hypotension Neg Hx   . Malignant hyperthermia Neg Hx   . Pseudochol deficiency Neg Hx     Review of Systems: The review of systems is per the HPI.  All other systems were reviewed and are negative.  Physical  Exam: BP 130/72   Pulse 91   Ht 5\' 5"  (1.651 m)   Wt 203 lb 12.8 oz (92.4 kg)   BMI 33.91 kg/m  Patient is alert and in no acute distress. She is obese.  Skin is warm and dry. Color is normal.  HEENT is unremarkable. Normocephalic/atraumatic. PERRL. Sclera are nonicteric. Neck is supple. No masses. No JVD. Lungs are clear. Cardiac exam shows a regular rate and rhythm. Normal S1-2. No gallop or murmur. Abdomen is soft. Extremities are without edema. Gait and ROM are intact. No gross neurologic deficits noted.  LABORATORY DATA:  Lab Results  Component Value Date   WBC 5.9 07/08/2016   HGB 12.5 07/08/2016   HCT 39.3 07/08/2016   PLT 224 07/08/2016   GLUCOSE 98 07/08/2016   CHOL 172 07/08/2016   TRIG 111 07/08/2016   HDL 59 07/08/2016   LDLCALC 91 07/08/2016   ALT 16 07/08/2016   AST 25 07/08/2016   NA 142 07/08/2016   K 4.3 07/08/2016   CL 107 07/08/2016   CREATININE 0.89 (H) 07/08/2016   BUN 13 07/08/2016   CO2 23 07/08/2016   INR 1.07 08/03/2009   HGBA1C 5.4 05/10/2010    Myoview study 07/26/15:Study Highlights    The left ventricular ejection fraction is hyperdynamic (>65%).  Nuclear stress EF: 72%.  There was no ST segment deviation noted during stress.  The study is normal.  This is a low risk study.   This is a normal pharmacologic nuclear study with no evidence of prior infarct or ischemia. Normal LVEF.     Ecg today shows NSR with rate 91. RBBB. No change. I have personally reviewed and interpreted this study.   Assessment / Plan: 1. CAD - s/p stenting of the LAD and OM in 2005 with Cypher stents. Asymptomatic - normal Myoview in June 2017. Continue with CV risk factor modification. Since she has first generation DES I would favor continuing DAPT indefinitely unless she has bleeding issues.   2. HTN - BP is well controlled.   3. HLD - on statin. Lipids are satisfactory. LFTs are normal.  4. Chronic RBBB since 2014.    I will follow up in 6  months.

## 2016-07-11 ENCOUNTER — Ambulatory Visit (INDEPENDENT_AMBULATORY_CARE_PROVIDER_SITE_OTHER): Payer: Medicare Other | Admitting: Cardiology

## 2016-07-11 ENCOUNTER — Encounter: Payer: Self-pay | Admitting: Cardiology

## 2016-07-11 VITALS — BP 130/72 | HR 91 | Ht 65.0 in | Wt 203.8 lb

## 2016-07-11 DIAGNOSIS — E7800 Pure hypercholesterolemia, unspecified: Secondary | ICD-10-CM

## 2016-07-11 DIAGNOSIS — I10 Essential (primary) hypertension: Secondary | ICD-10-CM | POA: Diagnosis not present

## 2016-07-11 DIAGNOSIS — I2510 Atherosclerotic heart disease of native coronary artery without angina pectoris: Secondary | ICD-10-CM

## 2016-07-11 NOTE — Patient Instructions (Signed)
Continue your current therapy  I will see you in 6 months.   

## 2016-07-16 ENCOUNTER — Encounter: Payer: Self-pay | Admitting: Internal Medicine

## 2016-07-27 ENCOUNTER — Other Ambulatory Visit: Payer: Self-pay | Admitting: Cardiology

## 2016-09-18 ENCOUNTER — Ambulatory Visit: Payer: Medicare Other | Admitting: Internal Medicine

## 2016-10-05 ENCOUNTER — Other Ambulatory Visit: Payer: Self-pay | Admitting: Cardiology

## 2016-10-11 ENCOUNTER — Encounter: Payer: Self-pay | Admitting: *Deleted

## 2016-10-24 ENCOUNTER — Ambulatory Visit: Payer: Medicare Other | Admitting: Internal Medicine

## 2016-11-01 ENCOUNTER — Other Ambulatory Visit: Payer: Self-pay | Admitting: Cardiology

## 2016-12-09 ENCOUNTER — Other Ambulatory Visit: Payer: Self-pay | Admitting: Family Medicine

## 2016-12-09 DIAGNOSIS — Z1231 Encounter for screening mammogram for malignant neoplasm of breast: Secondary | ICD-10-CM

## 2016-12-16 DIAGNOSIS — J45909 Unspecified asthma, uncomplicated: Secondary | ICD-10-CM | POA: Insufficient documentation

## 2016-12-16 DIAGNOSIS — K224 Dyskinesia of esophagus: Secondary | ICD-10-CM | POA: Insufficient documentation

## 2016-12-16 DIAGNOSIS — M1990 Unspecified osteoarthritis, unspecified site: Secondary | ICD-10-CM | POA: Insufficient documentation

## 2016-12-16 DIAGNOSIS — D131 Benign neoplasm of stomach: Secondary | ICD-10-CM | POA: Insufficient documentation

## 2016-12-16 DIAGNOSIS — K449 Diaphragmatic hernia without obstruction or gangrene: Secondary | ICD-10-CM | POA: Insufficient documentation

## 2016-12-16 DIAGNOSIS — M810 Age-related osteoporosis without current pathological fracture: Secondary | ICD-10-CM | POA: Insufficient documentation

## 2016-12-16 DIAGNOSIS — K648 Other hemorrhoids: Secondary | ICD-10-CM | POA: Insufficient documentation

## 2016-12-16 DIAGNOSIS — K5790 Diverticulosis of intestine, part unspecified, without perforation or abscess without bleeding: Secondary | ICD-10-CM | POA: Insufficient documentation

## 2016-12-16 DIAGNOSIS — K259 Gastric ulcer, unspecified as acute or chronic, without hemorrhage or perforation: Secondary | ICD-10-CM | POA: Insufficient documentation

## 2016-12-16 DIAGNOSIS — K8020 Calculus of gallbladder without cholecystitis without obstruction: Secondary | ICD-10-CM | POA: Insufficient documentation

## 2016-12-16 DIAGNOSIS — S82409A Unspecified fracture of shaft of unspecified fibula, initial encounter for closed fracture: Secondary | ICD-10-CM | POA: Insufficient documentation

## 2016-12-24 NOTE — Progress Notes (Signed)
Pamela Gonzales Date of Birth: 12/15/1930 Medical Record #[id number]  History of Present Illness: Pamela Gonzales is seen back today for follow up of CAD. She has a history of HLD, Lupus, GERD, OA, RBBB and CAD with past stents placed in 2005. She had stenting of the mid LAD and 2nd OM in 2005 with Cypher stents. Last Myoview was in June 2017 and was normal. She has remained on Plavix.   She was seen by GI in May for abdominal pain. Upper EGD showed multiple gastric polyps that were biopsed. Flexible sigmoidoscopy showed one some 3 mm polyp. UGI showed a moderate to large hiatal hernia and nonspecific esophageal dysmotility disorder.   On follow up today she is feeling very well.  She remains very active with water aerobics and going to the gym she did give up singing in the choir at church because of difficulty climbing stairs to the loft. She has chronic back pain with radiculopathy in left foot. She continues to have constipation and is planning to see Dr. Hilarie Fredrickson.   Current Outpatient Medications  Medication Sig Dispense Refill  . aspirin 81 MG tablet Take 81 mg by mouth daily.      Marland Kitchen atorvastatin (LIPITOR) 40 MG tablet Take 1 tablet (40 mg total) by mouth daily. 90 tablet 3  . Calcium Citrate-Vitamin D (CALCIUM + D PO) Take 1 tablet by mouth 2 (two) times daily.    . clopidogrel (PLAVIX) 75 MG tablet TAKE 1 TABLET (75 MG TOTAL) BY MOUTH DAILY. 90 tablet 3  . dextromethorphan-guaiFENesin (MUCINEX DM) 30-600 MG 12hr tablet Take 2 tablets by mouth daily.    . folic acid (FOLVITE) 1 MG tablet TAKE 1/2 TABLET BY MOUTH EVERY DAY 45 tablet 3  . losartan (COZAAR) 25 MG tablet Take 25 mg by mouth daily.    . Multiple Vitamins-Minerals (MULTIVITAMIN WITH MINERALS) tablet Take 1 tablet by mouth daily.    Marland Kitchen MYRBETRIQ 50 MG TB24 tablet Take 1 tablet by mouth daily.  2  . NASONEX 50 MCG/ACT nasal spray 2 sprays by Nasal route Daily.    Marland Kitchen NITROSTAT 0.4 MG SL tablet Place 0.4 mg under the tongue every 5 (five)  minutes as needed for chest pain.     . pantoprazole (PROTONIX) 40 MG tablet Take 40 mg by mouth daily.    . sucralfate (CARAFATE) 1 g tablet TAKE 1 TABLET BY MOUTH ON AN EMPTY STOMACH BEFORE MEALS TWICE A DAY AND AT BEDTIME  2   No current facility-administered medications for this visit.     Allergies  Allergen Reactions  . Cephalexin   . Lisinopril Cough  . Oxycodone     itching  . Penicillins   . Sulfa Drugs Cross Reactors Other (See Comments)    Skin irritation.  . Tramadol     Past Medical History:  Diagnosis Date  . Arthritis   . Asthmatic bronchitis   . Benign fundic gland polyps of stomach   . CAD (coronary artery disease) 05   stents  . Diverticulosis   . Esophageal dysmotility   . Fibula fracture    right  . Gallstones   . Gastric erosions   . GERD (gastroesophageal reflux disease)   . Hiatal hernia   . Hypercholesteremia   . Internal hemorrhoids   . Lupus   . Osteoarthritis   . Osteoporosis   . RBBB   . Spinal stenosis     Past Surgical History:  Procedure Laterality Date  . ABDOMINAL  HYSTERECTOMY  1975  . BREAST LUMPECTOMY  1970  . CATARACT EXTRACTION, BILATERAL    . CHOLECYSTECTOMY  07/04/2011   Procedure: LAPAROSCOPIC CHOLECYSTECTOMY WITH INTRAOPERATIVE CHOLANGIOGRAM;  Surgeon: Merrie Roof, MD;  Location: [facility];  Service: General;  Laterality: N/A;  . COLONOSCOPY W/ BIOPSIES    . CORONARY ANGIOPLASTY  2005  . ESOPHAGOGASTRODUODENOSCOPY (EGD) WITH PROPOFOL N/A 06/11/2016   Procedure: ESOPHAGOGASTRODUODENOSCOPY (EGD) WITH PROPOFOL;  Surgeon: Garlan Fair, MD;  Location: [facility];  Service: Endoscopy;  Laterality: N/A;  . FIBULA FRACTURE SURGERY  2011   Right Ankle  . FLEXIBLE SIGMOIDOSCOPY N/A 07/09/2016   Procedure: FLEXIBLE SIGMOIDOSCOPY;  Surgeon: Garlan Fair, MD;  Location: [facility];  Service: Endoscopy;  Laterality: N/A;    Social History   Tobacco Use  Smoking Status Never Smoker  Smokeless Tobacco Never Used     Social History   Substance and Sexual Activity  Alcohol Use No    Family History  Problem Relation Age of Onset  . Cancer Father   . Heart attack Mother   . Heart attack Brother   . Cancer Sister   . Cancer Sister   . Anesthesia problems Neg Hx   . Hypotension Neg Hx   . Malignant hyperthermia Neg Hx   . Pseudochol deficiency Neg Hx     Review of Systems: The review of systems is per the HPI.  All other systems were reviewed and are negative.  Physical Exam: BP 124/70   Pulse 88   Ht 5\' 5"  (1.651 m)   Wt 202 lb 6.4 oz (91.8 kg)   SpO2 94%   BMI 33.68 kg/m  GENERAL:  Well appearing HEENT:  PERRL, EOMI, sclera are clear. Oropharynx is clear. NECK:  No jugular venous distention, carotid upstroke brisk and symmetric, no bruits, no thyromegaly or adenopathy LUNGS:  Clear to auscultation bilaterally CHEST:  Unremarkable HEART:  RRR,  PMI not displaced or sustained,S1 and S2 within normal limits, no S3, no S4: no clicks, no rubs, no murmurs ABD:  Soft, nontender. BS +, no masses or bruits. No hepatomegaly, no splenomegaly EXT:  2 + pulses throughout, no edema, no cyanosis no clubbing SKIN:  Warm and dry.  No rashes NEURO:  Alert and oriented x 3. Cranial nerves II through XII intact. PSYCH:  Cognitively intact    LABORATORY DATA:  Lab Results  Component Value Date   WBC 5.9 07/08/2016   HGB 12.5 07/08/2016   HCT 39.3 07/08/2016   PLT 224 07/08/2016   GLUCOSE 98 07/08/2016   CHOL 172 07/08/2016   TRIG 111 07/08/2016   HDL 59 07/08/2016   LDLCALC 91 07/08/2016   ALT 16 07/08/2016   AST 25 07/08/2016   NA 142 07/08/2016   K 4.3 07/08/2016   CL 107 07/08/2016   CREATININE 0.89 (H) 07/08/2016   BUN 13 07/08/2016   CO2 23 07/08/2016   INR 1.07 08/03/2009   HGBA1C 5.4 05/10/2010    Myoview study 07/26/15:Study Highlights    The left ventricular ejection fraction is hyperdynamic (>65%).  Nuclear stress EF: 72%.  There was no ST segment deviation noted  during stress.  The study is normal.  This is a low risk study.   This is a normal pharmacologic nuclear study with no evidence of prior infarct or ischemia. Normal LVEF.       Assessment / Plan: 1. CAD - s/p stenting of the LAD and OM in 2005 with Cypher stents. Normal Myoview  in June 2017. Continue with CV risk factor modification. She is asymptomatic. Since she has first generation DES I would favor continuing DAPT indefinitely unless she has bleeding issues.   2. HTN - BP is well controlled.   3. HLD - on statin. Lipids are satisfactory. LFTs are normal.  4. Chronic RBBB since 2014.    I will follow up in 6 months with lab work

## 2016-12-27 ENCOUNTER — Ambulatory Visit: Payer: Medicare Other | Admitting: Cardiology

## 2016-12-27 ENCOUNTER — Encounter: Payer: Self-pay | Admitting: Cardiology

## 2016-12-27 ENCOUNTER — Other Ambulatory Visit: Payer: Self-pay

## 2016-12-27 VITALS — BP 124/70 | HR 88 | Ht 65.0 in | Wt 202.4 lb

## 2016-12-27 DIAGNOSIS — I10 Essential (primary) hypertension: Secondary | ICD-10-CM | POA: Diagnosis not present

## 2016-12-27 DIAGNOSIS — E7800 Pure hypercholesterolemia, unspecified: Secondary | ICD-10-CM

## 2016-12-27 DIAGNOSIS — I4510 Unspecified right bundle-branch block: Secondary | ICD-10-CM

## 2016-12-27 DIAGNOSIS — I2510 Atherosclerotic heart disease of native coronary artery without angina pectoris: Secondary | ICD-10-CM | POA: Diagnosis not present

## 2016-12-27 MED ORDER — NITROGLYCERIN 0.4 MG SL SUBL: 0.4000 mg | SUBLINGUAL_TABLET | SUBLINGUAL | 11 refills | Status: DC | PRN

## 2016-12-27 NOTE — Patient Instructions (Addendum)
Continue your current therapy  I will see you in 6 months with lab work   

## 2016-12-31 ENCOUNTER — Encounter: Payer: Self-pay | Admitting: Internal Medicine

## 2016-12-31 ENCOUNTER — Ambulatory Visit (INDEPENDENT_AMBULATORY_CARE_PROVIDER_SITE_OTHER): Payer: Medicare Other | Admitting: Internal Medicine

## 2016-12-31 VITALS — BP 130/76 | HR 72 | Ht 65.0 in | Wt 201.2 lb

## 2016-12-31 DIAGNOSIS — K219 Gastro-esophageal reflux disease without esophagitis: Secondary | ICD-10-CM

## 2016-12-31 DIAGNOSIS — R110 Nausea: Secondary | ICD-10-CM | POA: Diagnosis not present

## 2016-12-31 DIAGNOSIS — K5900 Constipation, unspecified: Secondary | ICD-10-CM

## 2016-12-31 MED ORDER — PANTOPRAZOLE SODIUM 40 MG PO TBEC: 40.0000 mg | DELAYED_RELEASE_TABLET | Freq: Every day | ORAL | 4 refills | Status: DC

## 2016-12-31 NOTE — Patient Instructions (Signed)
We have sent the following medications to your pharmacy for you to pick up at your convenience: Pantoprazole 40 mg daily  Discontinue sucralfate (carafate).  Please purchase the following medications over the counter and take as directed: Benefiber-Take 1 teaspoon once daily. Work your way up to 1 tablespoon  Call or "MyChart" our office if you have any worsening of abdominal discomfort or nausea with discontinuation of sucralfate. Our phone number is [phone].  Please follow up with Dr Hilarie Fredrickson in 3-4 months.  CC:Dr Melinda Crutch

## 2016-12-31 NOTE — Progress Notes (Signed)
Patient ID: Pamela Gonzales, female   DOB: 18-Dec-1930, 83 y.o.   MRN: [id number]  HPI: Pamela Gonzales is an 83 year old female with a past medical history of GERD, complex hiatal hernia, history of gastritis, history of diverticulosis, history of colon polyp, who is seen to establish care after her gastroenterologist, Dr. Earle Gell, retired.  She also has a history of arthritis, CAD on Plavix with remote PCI, hypercholesterolemia, osteoporosis and spinal stenosis.  She is here alone today.  She last saw Dr. Wynetta Emery in May 2018 after having undergone workup for nausea, epigastric pain and mild constipation.  She had an upper endoscopy on 06/11/2016 which showed a regular Z line, a complex hiatal hernia with Cameron's erosions, benign fundic gland polyps and was otherwise normal.  Biopsies were benign without H. pylori.  She then had an upper GI series with KUB showing a moderate to large bilobed hiatal hernia evidence of gastric reflux and nonspecific esophageal dysmotility.  The 13 mm barium tablet passed into the stomach without problem.  She also underwent a flex sig in June 2018 to evaluate her lower abdominal pain and constipation.  This revealed a 3 mm rectal polyp removed with cold forceps which was a tubular adenoma without high-grade dysplasia.  The exam was otherwise unremarkable.  She was treated with pantoprazole 40 mg daily, which was long-term for her but sucralfate was added before meals and at bedtime.  She also took MiraLAX for some time but reported this caused GI irritation and so she stopped it.  She also tried Dulcolax and Metamucil which did the same thing to her abdomen and that it was "irritating".  Most recently she has been feeling fairly well.  She still has some regurgitation and reflux but this is not heartburn or indigestion.  She is avoiding acidic foods and caffeine.  She avoids eating late at night.  Her epigastric pain has largely resolved though she has had some mild nausea.   No vomiting recently.  Earlier this year she had 2 episodes of isolated vomiting.  No hematemesis.  Bowel movements have been occurring every other day but are formed and complete.  She can feel some lower abdominal irritation after bowel movement for 1 or 2 hours but this is not cramping or pain.  She has not had any blood in her stool or melena.  She is eating a high fiber diet but feels she can eat even more fiber.  Her last full colonoscopy was normal in 2013.  She has had prior cholecystectomy, hysterectomy and appendectomy.  Past Medical History:  Diagnosis Date  . Arthritis   . Asthmatic bronchitis   . Benign fundic gland polyps of stomach   . CAD (coronary artery disease) 05   stents  . Diverticulosis   . Esophageal dysmotility   . Fibula fracture    right  . Gallstones   . Gastric erosions   . GERD (gastroesophageal reflux disease)   . Hiatal hernia   . Hypercholesteremia   . Internal hemorrhoids   . Lupus   . Osteoarthritis   . Osteoporosis   . RBBB   . Spinal stenosis     Past Surgical History:  Procedure Laterality Date  . ABDOMINAL HYSTERECTOMY  1975  . BREAST LUMPECTOMY  1970  . CATARACT EXTRACTION, BILATERAL    . CHOLECYSTECTOMY  07/04/2011   Procedure: LAPAROSCOPIC CHOLECYSTECTOMY WITH INTRAOPERATIVE CHOLANGIOGRAM;  Surgeon: Merrie Roof, MD;  Location: [facility];  Service: General;  Laterality: N/A;  .  COLONOSCOPY W/ BIOPSIES    . CORONARY ANGIOPLASTY  2005  . ESOPHAGOGASTRODUODENOSCOPY (EGD) WITH PROPOFOL N/A 06/11/2016   Procedure: ESOPHAGOGASTRODUODENOSCOPY (EGD) WITH PROPOFOL;  Surgeon: Garlan Fair, MD;  Location: [facility];  Service: Endoscopy;  Laterality: N/A;  . FIBULA FRACTURE SURGERY  2011   Right Ankle  . FLEXIBLE SIGMOIDOSCOPY N/A 07/09/2016   Procedure: FLEXIBLE SIGMOIDOSCOPY;  Surgeon: Garlan Fair, MD;  Location: [facility];  Service: Endoscopy;  Laterality: N/A;    Outpatient Medications Prior to Visit  Medication Sig  Dispense Refill  . aspirin 81 MG tablet Take 81 mg by mouth daily.      Marland Kitchen atorvastatin (LIPITOR) 40 MG tablet Take 1 tablet (40 mg total) by mouth daily. 90 tablet 3  . Calcium Citrate-Vitamin D (CALCIUM + D PO) Take 1 tablet by mouth 2 (two) times daily.    . clopidogrel (PLAVIX) 75 MG tablet TAKE 1 TABLET (75 MG TOTAL) BY MOUTH DAILY. 90 tablet 3  . dextromethorphan-guaiFENesin (MUCINEX DM) 30-600 MG 12hr tablet Take 2 tablets by mouth daily.    . folic acid (FOLVITE) 1 MG tablet TAKE 1/2 TABLET BY MOUTH EVERY DAY 45 tablet 3  . losartan (COZAAR) 25 MG tablet Take 25 mg by mouth daily.    . Multiple Vitamins-Minerals (MULTIVITAMIN WITH MINERALS) tablet Take 1 tablet by mouth daily.    Marland Kitchen MYRBETRIQ 50 MG TB24 tablet Take 1 tablet by mouth daily.  2  . NASONEX 50 MCG/ACT nasal spray 2 sprays by Nasal route Daily.    . nitroGLYCERIN (NITROSTAT) 0.4 MG SL tablet Place 1 tablet (0.4 mg total) under the tongue every 5 (five) minutes as needed for chest pain. 25 tablet 11  . pantoprazole (PROTONIX) 40 MG tablet Take 40 mg by mouth daily.    . sucralfate (CARAFATE) 1 g tablet TAKE 1 TABLET BY MOUTH ON AN EMPTY STOMACH BEFORE MEALS TWICE A DAY AND AT BEDTIME  2   No facility-administered medications prior to visit.     Allergies  Allergen Reactions  . Cephalexin   . Lisinopril Cough  . Oxycodone     itching  . Penicillins   . Sulfa Drugs Cross Reactors Other (See Comments)    Skin irritation.  . Tramadol     Family History  Problem Relation Age of Onset  . Cancer Father   . Heart attack Mother   . Heart attack Brother   . Cancer Sister   . Cancer Sister   . Anesthesia problems Neg Hx   . Hypotension Neg Hx   . Malignant hyperthermia Neg Hx   . Pseudochol deficiency Neg Hx     Social History   Tobacco Use  . Smoking status: Never Smoker  . Smokeless tobacco: Never Used  Substance Use Topics  . Alcohol use: No  . Drug use: No    ROS: As per history of present illness,  otherwise negative  BP 130/76   Pulse 72   Ht 5\' 5"  (1.651 m)   Wt 201 lb 4 oz (91.3 kg)   BMI 33.49 kg/m  Constitutional: Well-developed and well-nourished. No distress. HEENT: Normocephalic and atraumatic. Oropharynx is clear and moist. Conjunctivae are normal.  No scleral icterus. Neck: Neck supple. Trachea midline. Cardiovascular: Normal rate, regular rhythm and intact distal pulses. No M/R/G Pulmonary/chest: Effort normal and breath sounds normal. No wheezing, rales or rhonchi. Abdominal: Soft, nontender, nondistended. Bowel sounds active throughout. There are no masses palpable. No hepatosplenomegaly. Extremities: no clubbing, cyanosis, or  edema Neurological: Alert and oriented to person place and time. Skin: Skin is warm and dry.  Psychiatric: Normal mood and affect. Behavior is normal.  RELEVANT LABS AND IMAGING: CBC    Component Value Date/Time   WBC 5.9 07/08/2016 0838   RBC 4.31 07/08/2016 0838   HGB 12.5 07/08/2016 0838   HCT 39.3 07/08/2016 0838   PLT 224 07/08/2016 0838   MCV 91.2 07/08/2016 0838   MCH 29.0 07/08/2016 0838   MCHC 31.8 (L) 07/08/2016 0838   RDW 14.9 07/08/2016 0838   LYMPHSABS 1,062 07/08/2016 0838   MONOABS 590 07/08/2016 0838   EOSABS 118 07/08/2016 0838   BASOSABS 59 07/08/2016 0838    CMP     Component Value Date/Time   NA 142 07/08/2016 0838   K 4.3 07/08/2016 0838   CL 107 07/08/2016 0838   CO2 23 07/08/2016 0838   GLUCOSE 98 07/08/2016 0838   BUN 13 07/08/2016 0838   CREATININE 0.89 (H) 07/08/2016 0838   CALCIUM 9.3 07/08/2016 0838   PROT 6.4 07/08/2016 0838   ALBUMIN 3.6 07/08/2016 0838   AST 25 07/08/2016 0838   ALT 16 07/08/2016 0838   ALKPHOS 47 07/08/2016 0838   BILITOT 0.8 07/08/2016 0838   GFRNONAA 38 (L) 04/12/2013 0340   GFRAA 44 (L) 04/12/2013 0340    ASSESSMENT/PLAN: 83 year old female with a past medical history of GERD, complex hiatal hernia, history of gastritis, history of diverticulosis, history of colon  polyp, who is seen to establish care  1. GERD with complex HH --she does have a complex hiatal hernia is seen by endoscopy and upper GI series.  I reviewed these test today.  Her heartburn and indigestion are well controlled.  Her mild nausea may be related to this hernia.  She is following an anti-GERD diet and lifestyle modifications.  I will continue her on pantoprazole 40 mg once daily.  We will stop the sucralfate as she cannot tell that it has benefited her much.  If on stopping it she has worsening nausea or upper abdominal pain she is asked to notify me and she voices understanding.  2.  Mild constipation --I am going to add Benefiber 1 teaspoon working up to 1 tablespoon daily.  She is having a full and complete bowel movement every other day but feels that she would feel even better if she went daily.  I tried to provide reassurance that this is not necessary but we will try Benefiber and see how she responds.  3.  CRC screening/history of small rectal adenoma --she had a small tubular adenoma in the rectum earlier this year.  She states that Dr. Wynetta Emery had previously recommended against full colonoscopy based on age.  We discussed this today and after our discussion at this point we will not proceed with full colonoscopy.  We can discuss this again on future follow-up.  3-54-month follow-up, sooner if needed     QP:RFFM, Dwyane Luo, [place], [place] [address]

## 2017-01-09 ENCOUNTER — Ambulatory Visit: Payer: Medicare Other

## 2017-01-10 ENCOUNTER — Ambulatory Visit: Payer: Medicare Other

## 2017-01-31 ENCOUNTER — Ambulatory Visit
Admission: RE | Admit: 2017-01-31 | Discharge: 2017-01-31 | Disposition: A | Discharge status: Home / Self Care | Payer: Medicare Other | Source: Ambulatory Visit | Attending: Family Medicine | Admitting: Family Medicine

## 2017-01-31 DIAGNOSIS — Z1231 Encounter for screening mammogram for malignant neoplasm of breast: Secondary | ICD-10-CM

## 2017-03-26 ENCOUNTER — Encounter: Payer: Self-pay | Admitting: Internal Medicine

## 2017-05-01 ENCOUNTER — Encounter: Payer: Self-pay | Admitting: Neurology

## 2017-05-19 ENCOUNTER — Encounter: Payer: Self-pay | Admitting: *Deleted

## 2017-06-09 ENCOUNTER — Encounter: Payer: Self-pay | Admitting: Internal Medicine

## 2017-06-09 ENCOUNTER — Encounter

## 2017-06-09 ENCOUNTER — Ambulatory Visit: Payer: Medicare Other | Admitting: Internal Medicine

## 2017-06-09 VITALS — BP 104/68 | HR 85 | Ht 64.0 in | Wt 200.0 lb

## 2017-06-09 DIAGNOSIS — K219 Gastro-esophageal reflux disease without esophagitis: Secondary | ICD-10-CM | POA: Diagnosis not present

## 2017-06-09 DIAGNOSIS — K5900 Constipation, unspecified: Secondary | ICD-10-CM | POA: Diagnosis not present

## 2017-06-09 DIAGNOSIS — K449 Diaphragmatic hernia without obstruction or gangrene: Secondary | ICD-10-CM | POA: Diagnosis not present

## 2017-06-09 DIAGNOSIS — K644 Residual hemorrhoidal skin tags: Secondary | ICD-10-CM | POA: Diagnosis not present

## 2017-06-09 MED ORDER — PANTOPRAZOLE SODIUM 40 MG PO TBEC: 40.0000 mg | DELAYED_RELEASE_TABLET | Freq: Every day | ORAL | 4 refills | Status: DC

## 2017-06-09 MED ORDER — HYDROCORTISONE ACE-PRAMOXINE 2.5-1 % RE CREA: 1.0000 "application " | TOPICAL_CREAM | Freq: Two times a day (BID) | RECTAL | 0 refills | Status: DC | PRN

## 2017-06-09 MED ORDER — SUCRALFATE 1 G PO TABS: ORAL_TABLET | ORAL | 3 refills | Status: DC

## 2017-06-09 NOTE — Progress Notes (Signed)
Subjective:    Patient ID: Pamela Gonzales, female    DOB: 1930/11/21, 84 y.o.   MRN: [id number]  HPI Pamela Gonzales is an 84 year-old with past medical history of GERD with complex hiatal hernia, history of gastritis, history of diverticulosis, history of colon polyp, history of mild constipation who is here for follow-up.  She was last seen on 12/31/2016.  She also has a history of arthritis, CAD on Plavix with remote PCI, hyperlipidemia, osteoporosis and spinal stenosis.  She is here alone today.  At the time of her last visit we discussed her reflux with complex hiatal hernia.  She had been maintained on pantoprazole 40 mg daily.  We stopped Carafate because we were not sure if it was helping.  She has been having occasional upper abdominal pain.  She feels that the pain may be slightly more noticeable off of the Carafate.  Pain has no definitive trigger.  She was taking the sucralfate tablets.  From a mild constipation standpoint she had previously tried MiraLAX which caused bloating and abdominal cramping.  MiraLAX was stopped and she tried Benefiber which helps better than MiraLAX but still was associated with abdominal bloating and still straining with stool.  She is now using prune juice which seems to be the most beneficial for her.  She still feels constipation however and feels "grumpy".  She will occasionally have rectal discomfort after bowel movement.  She is using Preparation H for external hemorrhoid discomfort.  She has remained very active and is involved in water aerobics and exercise on a near daily basis.  She recently returned from a trip to the beach with 7 of her girlfriends.   Review of Systems As per HPI, otherwise negative  Current Medications, Allergies, Past Medical History, Past Surgical History, Family History and Social History were reviewed in [employer] record.     Objective:   Physical Exam BP 104/68   Pulse 85   Ht 5\' 4"  (1.626 m)    Wt 200 lb (90.7 kg)   BMI 34.33 kg/m  Constitutional: Well-developed and well-nourished. No distress. HEENT: Normocephalic and atraumatic. Conjunctivae are normal.  No scleral icterus. Neck: Neck supple. Trachea midline. Cardiovascular: Normal rate, regular rhythm and intact distal pulses. No M/R/G Pulmonary/chest: Effort normal and breath sounds normal. No wheezing, rales or rhonchi. Abdominal: Soft, nontender, nondistended. Bowel sounds active throughout. There are no masses palpable. No hepatosplenomegaly. Extremities: no clubbing, cyanosis, or edema Neurological: Alert and oriented to person place and time. Skin: Skin is warm and dry. Psychiatric: Normal mood and affect. Behavior is normal.     Assessment & Plan:  84 year-old with past medical history of GERD with complex hiatal hernia, history of gastritis, history of diverticulosis, history of colon polyp, history of mild constipation who is here for follow-up.  1. GERD/hiatal hernia/upper abd discomfort --pantoprazole 40 mg daily is working well.  Some of her upper abdominal discomfort may be secondary to her known hiatal hernia.  I am going to start sucralfate back but have her make the tablets into a slurry for hopefully better efficacy in the esophagus and proximal stomach.  She can use this 3 times daily AC and at bedtime on an as-needed basis.  2. Constipation --overall mild and MiraLAX and Benefiber led to bloating and crampy lower discomfort.  We will have her use prune juice 4 ounces daily to see if this is more effective for more regular bowel movement.  3. External hemorrhoids --Analpram  twice daily as needed, this will replace Preparation H over-the-counter  4-6 month ROV 25 minutes spent with the patient today. Greater than 50% was spent in counseling and coordination of care with the patient

## 2017-06-09 NOTE — Patient Instructions (Addendum)
Please follow up with Dr Hilarie Fredrickson in 4-6 months.  Continue pantoprazole 40 mg daily.  Carafate- Make into a slurry-- mix 10 ml warm water with tablet and dissolve tablet then drink-- Take this three times daily before meals and at bedtime.  Use analpram twice daily as needed to rectum.  Take Prune Juice 4 ounces daily.  If you are age 82 or older, your body mass index should be between 23-30. Your Body mass index is 34.33 kg/m. If this is out of the aforementioned range listed, please consider follow up with your Primary Care Provider.  If you are age 35 or younger, your body mass index should be between 19-25. Your Body mass index is 34.33 kg/m. If this is out of the aformentioned range listed, please consider follow up with your Primary Care Provider.

## 2017-06-10 ENCOUNTER — Telehealth: Payer: Self-pay | Admitting: Internal Medicine

## 2017-06-10 NOTE — Telephone Encounter (Signed)
Patient has questions regarding medication carafate and how many times to take it a day. Pt seen yesterday 5.13.19.

## 2017-06-10 NOTE — Telephone Encounter (Signed)
Left voicemail for patient to call back. 

## 2017-06-10 NOTE — Telephone Encounter (Signed)
Advised patient that she is to take carafate before meals and at bedtime, so in essence, 4 times daily. She states she was told by Dr Hilarie Fredrickson to take it for 3 days then just take it once daily.   Dr Hilarie Fredrickson, would you mind clarifying?

## 2017-06-10 NOTE — Telephone Encounter (Signed)
Analpram was to be used BID to TID x 3 days, then PRN  Sucralfate slurry TID-AC and HS PRN (would using scheduled TIDAC and HS for 1 week before determining how often it is needed)  Hope this helps

## 2017-06-11 NOTE — Telephone Encounter (Signed)
I have spoken to patient to give Dr Pamela Gonzales clarification on medications. She verbalizes understanding.

## 2017-06-11 NOTE — Telephone Encounter (Signed)
Patient returning call to discuss medication.

## 2017-06-16 ENCOUNTER — Telehealth: Payer: Self-pay | Admitting: Internal Medicine

## 2017-06-16 NOTE — Telephone Encounter (Signed)
Left voicemail for patient to call back. 

## 2017-06-16 NOTE — Telephone Encounter (Signed)
Pt said she is returning your call °

## 2017-06-17 MED ORDER — HYDROCORTISONE ACE-PRAMOXINE 2.5-1 % RE CREA: 1.0000 "application " | TOPICAL_CREAM | Freq: Two times a day (BID) | RECTAL | 1 refills | Status: DC | PRN

## 2017-06-17 NOTE — Telephone Encounter (Signed)
Patient returning call stating to please call her cell phone back

## 2017-06-17 NOTE — Telephone Encounter (Signed)
I have spoken to patient who seems to have gotten from her pharmacy only 12 individual analpram packs (not even enough for a full month) rather than the 30 gram tube we prescribed. Therefore, she was applying 1 application internally twice daily rather then to the outside of the rectum. I have contacted the pharmacy who states "there may have been an availability issue" and they arent sure why patient was given 12 individual packs. However, they will provide patient with 30 gram tube this time. Patient has been advised to apply to outside of rectum twice daily as needed. She verbalizes understanding.

## 2017-06-21 NOTE — Progress Notes (Signed)
Pamela Gonzales Date of Birth: 09/09/1930 Medical Record #[id number]  History of Present Illness: Pamela Gonzales is seen back today for follow up of CAD. She has a history of HLD, Lupus, GERD, OA, RBBB and CAD with past stents placed in 2005. She had stenting of the mid LAD and 2nd OM in 2005 with Cypher stents. Last Myoview was in June 2017 and was normal. She has remained on Plavix.   On follow up today she is feeling very well.  She remains very active with water aerobics 3x/week. She has chronic back pain as well as neuropathy in left foot. She denies any chest pain or dyspnea. No edema.   Current Outpatient Medications  Medication Sig Dispense Refill  . aspirin 81 MG tablet Take 81 mg by mouth daily.      . Calcium Citrate-Vitamin D (CALCIUM + D PO) Take 1 tablet by mouth 2 (two) times daily.    . clopidogrel (PLAVIX) 75 MG tablet TAKE 1 TABLET (75 MG TOTAL) BY MOUTH DAILY. 90 tablet 3  . dextromethorphan-guaiFENesin (MUCINEX DM) 30-600 MG 12hr tablet Take 2 tablets by mouth daily.    . folic acid (FOLVITE) 1 MG tablet TAKE 1/2 TABLET BY MOUTH EVERY DAY 45 tablet 3  . hydrocortisone-pramoxine (ANALPRAM HC) 2.5-1 % rectal cream Place 1 application rectally 2 (two) times daily as needed for hemorrhoids or anal itching. 30 g 1  . losartan (COZAAR) 25 MG tablet Take 25 mg by mouth daily.    . Multiple Vitamins-Minerals (MULTIVITAMIN WITH MINERALS) tablet Take 1 tablet by mouth daily.    Marland Kitchen MYRBETRIQ 50 MG TB24 tablet Take 1 tablet by mouth daily.  2  . NASONEX 50 MCG/ACT nasal spray 2 sprays by Nasal route Daily.    . nitroGLYCERIN (NITROSTAT) 0.4 MG SL tablet Place 1 tablet (0.4 mg total) under the tongue every 5 (five) minutes as needed for chest pain. 25 tablet 11  . pantoprazole (PROTONIX) 40 MG tablet Take 1 tablet (40 mg total) by mouth daily. 30 tablet 4  . sucralfate (CARAFATE) 1 g tablet Take 1 tablet before meals and at bedtime (4 times daily)-make into slurry 120 tablet 3   No current  facility-administered medications for this visit.     Allergies  Allergen Reactions  . Cephalexin   . Lisinopril Cough  . Oxycodone     itching  . Penicillins   . Sulfa Drugs Cross Reactors Other (See Comments)    Skin irritation.  . Tramadol     Past Medical History:  Diagnosis Date  . Arthritis   . Asthmatic bronchitis   . Benign fundic gland polyps of stomach   . CAD (coronary artery disease) 05   stents  . Diverticulosis   . Esophageal dysmotility   . Fibula fracture    right  . Gallstones   . Gastric erosions   . GERD (gastroesophageal reflux disease)   . Hiatal hernia   . Hypercholesteremia   . Internal hemorrhoids   . Lupus ([place])   . Osteoarthritis   . Osteoporosis   . RBBB   . Spinal stenosis   . Tubular adenoma of colon     Past Surgical History:  Procedure Laterality Date  . ABDOMINAL HYSTERECTOMY  1975  . BREAST EXCISIONAL BIOPSY Right   . BREAST LUMPECTOMY  1970  . CATARACT EXTRACTION, BILATERAL    . CHOLECYSTECTOMY  07/04/2011   Procedure: LAPAROSCOPIC CHOLECYSTECTOMY WITH INTRAOPERATIVE CHOLANGIOGRAM;  Surgeon: Merrie Roof, MD;  Location: [facility];  Service: General;  Laterality: N/A;  . COLONOSCOPY W/ BIOPSIES    . CORONARY ANGIOPLASTY  2005  . ESOPHAGOGASTRODUODENOSCOPY (EGD) WITH PROPOFOL N/A 06/11/2016   Procedure: ESOPHAGOGASTRODUODENOSCOPY (EGD) WITH PROPOFOL;  Surgeon: Garlan Fair, MD;  Location: [facility];  Service: Endoscopy;  Laterality: N/A;  . FIBULA FRACTURE SURGERY  2011   Right Ankle  . FLEXIBLE SIGMOIDOSCOPY N/A 07/09/2016   Procedure: FLEXIBLE SIGMOIDOSCOPY;  Surgeon: Garlan Fair, MD;  Location: [facility];  Service: Endoscopy;  Laterality: N/A;    Social History   Tobacco Use  Smoking Status Never Smoker  Smokeless Tobacco Never Used    Social History   Substance and Sexual Activity  Alcohol Use No    Family History  Problem Relation Age of Onset  . Lung cancer Father   . Heart attack Mother   .  Heart attack Brother   . Heart disease Brother   . Rectal cancer Brother        only 1 kidney  . Cancer Sister   . Pancreatic cancer Sister   . Heart disease Sister   . Anesthesia problems Neg Hx   . Hypotension Neg Hx   . Malignant hyperthermia Neg Hx   . Pseudochol deficiency Neg Hx   . Breast cancer Neg Hx   . Colon cancer Neg Hx   . Stomach cancer Neg Hx     Review of Systems: The review of systems is per the HPI.  All other systems were reviewed and are negative.  Physical Exam: BP (!) 162/76   Pulse 77   Ht 5\' 4"  (1.626 m)   Wt 201 lb (91.2 kg)   BMI 34.50 kg/m  GENERAL:  Well appearing, overweight WF in NAD HEENT:  PERRL, EOMI, sclera are clear. Oropharynx is clear. NECK:  No jugular venous distention, carotid upstroke brisk and symmetric, no bruits, no thyromegaly or adenopathy LUNGS:  Clear to auscultation bilaterally CHEST:  Unremarkable HEART:  RRR,  PMI not displaced or sustained,S1 and S2 within normal limits, no S3, no S4: no clicks, no rubs, no murmurs EXT:  2 + pulses throughout, no edema, no cyanosis no clubbing SKIN:  Warm and dry.  No rashes NEURO:  Alert and oriented x 3. Cranial nerves II through XII intact. PSYCH:  Cognitively intact       LABORATORY DATA:  Lab Results  Component Value Date   WBC 5.9 07/08/2016   HGB 12.5 07/08/2016   HCT 39.3 07/08/2016   PLT 224 07/08/2016   GLUCOSE 93 06/26/2017   CHOL 165 06/26/2017   TRIG 115 06/26/2017   HDL 53 06/26/2017   LDLCALC 89 06/26/2017   ALT 17 06/26/2017   AST 21 06/26/2017   NA 143 06/26/2017   K 4.4 06/26/2017   CL 107 (H) 06/26/2017   CREATININE 0.81 06/26/2017   BUN 12 06/26/2017   CO2 23 06/26/2017   INR 1.07 08/03/2009   HGBA1C 5.4 05/10/2010    Myoview study 07/26/15:Study Highlights    The left ventricular ejection fraction is hyperdynamic (>65%).  Nuclear stress EF: 72%.  There was no ST segment deviation noted during stress.  The study is normal.  This is a  low risk study.   This is a normal pharmacologic nuclear study with no evidence of prior infarct or ischemia. Normal LVEF.       Assessment / Plan: 1. CAD - s/p stenting of the LAD and OM in 2005 with first generation Cypher  stents. Normal Myoview in June 2017.  She is asymptomatic. Since she has first generation DES I would favor continuing DAPT indefinitely unless she has bleeding issues. She is in agreement with this.   2. HTN - BP is elevated today but has been well controlled. Will monitor on current therapy.  3. HLD - on statin. LDL 89. Goal 70. Will increase lipitor to 80 mg daily. Repeat lab 3 months.  4. Chronic RBBB since 2014.    I will follow up in 6 months

## 2017-06-25 ENCOUNTER — Telehealth: Payer: Self-pay | Admitting: Cardiology

## 2017-06-25 DIAGNOSIS — E7800 Pure hypercholesterolemia, unspecified: Secondary | ICD-10-CM

## 2017-06-25 DIAGNOSIS — I2510 Atherosclerotic heart disease of native coronary artery without angina pectoris: Secondary | ICD-10-CM

## 2017-06-25 DIAGNOSIS — I10 Essential (primary) hypertension: Secondary | ICD-10-CM

## 2017-06-25 NOTE — Telephone Encounter (Signed)
Requesting if labs are needed prior to office visit this Friday. Last lab work ordered 06/2016.

## 2017-06-25 NOTE — Telephone Encounter (Signed)
Yes lets get fasting labs  [name] Martin[place] MD, [facility]

## 2017-06-25 NOTE — Telephone Encounter (Signed)
Pt calling    Pt want to know if she need labs when she come in Friday.

## 2017-06-25 NOTE — Telephone Encounter (Signed)
Returned call to patient Dr.Jordan advised fasting bmet,lipid and hepatic panels.Labs will be placed in lab box.Stated she will have done 06/26/17.

## 2017-06-26 LAB — BASIC METABOLIC PANEL
BUN/Creatinine Ratio: 15 (ref 12–28)
BUN: 12 mg/dL (ref 8–27)
CALCIUM: 9.2 mg/dL (ref 8.7–10.3)
CO2: 23 mmol/L (ref 20–29)
CREATININE: 0.81 mg/dL (ref 0.57–1.00)
Chloride: 107 mmol/L — ABNORMAL HIGH (ref 96–106)
GFR calc Af Amer: 77 mL/min/{1.73_m2} (ref >59)
GFR, EST NON AFRICAN AMERICAN: 67 mL/min/{1.73_m2} (ref >59)
Glucose: 93 mg/dL (ref 65–99)
Potassium: 4.4 mmol/L (ref 3.5–5.2)
Sodium: 143 mmol/L (ref 134–144)

## 2017-06-26 LAB — HEPATIC FUNCTION PANEL
ALBUMIN: 3.9 g/dL (ref 3.5–4.7)
ALT: 17 IU/L (ref 0–32)
AST: 21 IU/L (ref 0–40)
Alkaline Phosphatase: 53 IU/L (ref 39–117)
Bilirubin Total: 0.6 mg/dL (ref 0.0–1.2)
Bilirubin, Direct: 0.13 mg/dL (ref 0.00–0.40)
TOTAL PROTEIN: 6.1 g/dL (ref 6.0–8.5)

## 2017-06-26 LAB — LIPID PANEL W/O CHOL/HDL RATIO
CHOLESTEROL TOTAL: 165 mg/dL (ref 100–199)
HDL: 53 mg/dL (ref >39)
LDL CALC: 89 mg/dL (ref 0–99)
TRIGLYCERIDES: 115 mg/dL (ref 0–149)
VLDL CHOLESTEROL CAL: 23 mg/dL (ref 5–40)

## 2017-06-27 ENCOUNTER — Encounter: Payer: Self-pay | Admitting: Cardiology

## 2017-06-27 ENCOUNTER — Ambulatory Visit: Payer: Medicare Other | Admitting: Cardiology

## 2017-06-27 VITALS — BP 162/76 | HR 77 | Ht 64.0 in | Wt 201.0 lb

## 2017-06-27 DIAGNOSIS — I10 Essential (primary) hypertension: Secondary | ICD-10-CM

## 2017-06-27 DIAGNOSIS — I4510 Unspecified right bundle-branch block: Secondary | ICD-10-CM | POA: Diagnosis not present

## 2017-06-27 DIAGNOSIS — I2510 Atherosclerotic heart disease of native coronary artery without angina pectoris: Secondary | ICD-10-CM

## 2017-06-27 DIAGNOSIS — E7800 Pure hypercholesterolemia, unspecified: Secondary | ICD-10-CM | POA: Diagnosis not present

## 2017-06-27 MED ORDER — ATORVASTATIN CALCIUM 80 MG PO TABS: 80.0000 mg | ORAL_TABLET | Freq: Every day | ORAL | 3 refills | Status: DC

## 2017-06-27 NOTE — Patient Instructions (Signed)
Increase lipitor to 80 mg daily  We will recheck lipids in 3 months.   Continue your current therapy

## 2017-06-27 NOTE — Addendum Note (Signed)
Addended by: Kathyrn Lass on: 06/27/2017 11:15 AM   Modules accepted: Orders

## 2017-07-03 ENCOUNTER — Other Ambulatory Visit: Payer: Self-pay | Admitting: Internal Medicine

## 2017-07-07 ENCOUNTER — Other Ambulatory Visit: Payer: Self-pay | Admitting: Internal Medicine

## 2017-07-09 ENCOUNTER — Other Ambulatory Visit: Payer: Self-pay | Admitting: Internal Medicine

## 2017-07-09 ENCOUNTER — Telehealth: Payer: Self-pay | Admitting: Internal Medicine

## 2017-07-09 NOTE — Telephone Encounter (Signed)
Left voicemail advising patient that pharmacy states that they do have script and it is already ready for pick up.

## 2017-07-28 ENCOUNTER — Encounter

## 2017-07-28 ENCOUNTER — Encounter: Payer: Self-pay | Admitting: Neurology

## 2017-07-28 ENCOUNTER — Ambulatory Visit: Payer: Medicare Other | Admitting: Neurology

## 2017-07-28 VITALS — BP 130/64 | HR 81 | Ht 64.0 in | Wt 200.5 lb

## 2017-07-28 DIAGNOSIS — R278 Other lack of coordination: Secondary | ICD-10-CM

## 2017-07-28 DIAGNOSIS — R2681 Unsteadiness on feet: Secondary | ICD-10-CM | POA: Diagnosis not present

## 2017-07-28 DIAGNOSIS — R202 Paresthesia of skin: Secondary | ICD-10-CM | POA: Diagnosis not present

## 2017-07-28 NOTE — Patient Instructions (Addendum)
Referral to start physical therapy for balance training  Recommend that you start using a gait assist device, a cane at the least.  Talk to your physical therapist to explore the appropriate option for you  Return for NCS/EMG of the legs  ELECTROMYOGRAM AND NERVE CONDUCTION STUDIES (EMG/NCS) INSTRUCTIONS  How to Prepare The neurologist conducting the EMG will need to know if you have certain medical conditions. Tell the neurologist and other EMG lab personnel if you: . Have a pacemaker or any other electrical medical device . Take blood-thinning medications . Have hemophilia, a blood-clotting disorder that causes prolonged bleeding Bathing Take a shower or bath shortly before your exam in order to remove oils from your skin. Don't apply lotions or creams before the exam.  What to Expect You'll likely be asked to change into a hospital gown for the procedure and lie down on an examination table. The following explanations can help you understand what will happen during the exam.  . Electrodes. The neurologist or a technician places surface electrodes at various locations on your skin depending on where you're experiencing symptoms. Or the neurologist may insert needle electrodes at different sites depending on your symptoms.  . Sensations. The electrodes will at times transmit a tiny electrical current that you may feel as a twinge or spasm. The needle electrode may cause discomfort or pain that usually ends shortly after the needle is removed. If you are concerned about discomfort or pain, you may want to talk to the neurologist about taking a short break during the exam.  . Instructions. During the needle EMG, the neurologist will assess whether there is any spontaneous electrical activity when the muscle is at rest - activity that isn't present in healthy muscle tissue - and the degree of activity when you slightly contract the muscle.  He or she will give you instructions on resting and  contracting a muscle at appropriate times. Depending on what muscles and nerves the neurologist is examining, he or she may ask you to change positions during the exam.  After your EMG You may experience some temporary, minor bruising where the needle electrode was inserted into your muscle. This bruising should fade within several days. If it persists, contact your primary care doctor.

## 2017-07-28 NOTE — Progress Notes (Signed)
[facility] [place] [facility] Clinic Note - Initial Visit   Date: 07/28/17  Pamela Gonzales MRN: [id number] DOB: [birth year]   Dear Dr. Harrington Challenger:  Thank you for your kind referral of Pamela Gonzales for consultation of neuropathy. Although her history is well known to you, please allow [place] to reiterate it for the purpose of our medical record. The patient was accompanied to the clinic by self.    History of Present Illness: Pamela Gonzales is a 84 y.o. right-handed Caucasian female with hyperlipidemia, hypertension, CAD, GERD, OA, SLE, osteoporosis presenting for evaluation of neuropathy.   She lives at [facility].  Starting in 2016, she began having "thickening sensation" over the toes and entire foot as well as imbalance and was stumbling more frequently.  She suffered two falls in the past few years and bruised her ribs on the left.  With prolonged standing, she has weakness in the left foot and feels lack of control.  Symptoms are intermittent and always triggered by standing and walking. There is no pain or tingling in the left foot or low back pain.  She has instability with gait and walks unassisted, but relies heavily on grab bars in the hallway. Occasionally, she has spasm in the back, no leg cramps.  She does not have similar problems in the right leg.    She stays very active and goes to the gym three times per week.   She has no personal history of diabetes, family history of neuropathy, no heavy alcohol consumption.  She saw her PCP for these symptoms and had vitmain B12 and TSH levels checked which were normal. She was evaluated by Dr. Sherwood Gambler initially for these symptoms who ordered MRI lumbar spine which showed degenerative changes with severe lumbar canal stenosis at L4-5 and severe biforaminal stenosis at L5-S1.    Out-side paper records, electronic medical record, and images have been reviewed where available and summarized as:  MRI lumbar spine  01/2017:  Degenerative changes at L1-2 and L2-3 levels, mild disc bulbing and canal stenosis at L3-4 level.  Significant degeneration at L4-5 and L5-S1, progressed from 2012.  At L4-5 level, there is severe hypertrophic facet arthropathy, ligamentum flavum thickening, circumfrential disc bulbing, and marked to severe multifactorial canal stenosis.  At L5-S1, there is circumfrential disc bulbing, hypertrophic facet atrhropathy, ligamentum flavum thickening, and mild canal but marked to severe lateral recess stenosis bilaterally at L5-S1 level. Labs 04/28/2017:  TSH 1.89, vitamin B12 1140  Past Medical History:  Diagnosis Date  . Arthritis   . Asthmatic bronchitis   . Benign fundic gland polyps of stomach   . CAD (coronary artery disease) 05   stents  . Diverticulosis   . Esophageal dysmotility   . Fibula fracture    right  . Gallstones   . Gastric erosions   . GERD (gastroesophageal reflux disease)   . Hiatal hernia   . Hypercholesteremia   . Internal hemorrhoids   . Lupus ([place])   . Osteoarthritis   . Osteoporosis   . RBBB   . Spinal stenosis   . Tubular adenoma of colon     Past Surgical History:  Procedure Laterality Date  . ABDOMINAL HYSTERECTOMY  1975  . BREAST EXCISIONAL BIOPSY Right   . BREAST LUMPECTOMY  1970  . CATARACT EXTRACTION, BILATERAL    . CHOLECYSTECTOMY  07/04/2011   Procedure: LAPAROSCOPIC CHOLECYSTECTOMY WITH INTRAOPERATIVE CHOLANGIOGRAM;  Surgeon: Merrie Roof, MD;  Location: [facility];  Service: General;  Laterality: N/A;  . COLONOSCOPY W/ BIOPSIES    . CORONARY ANGIOPLASTY  2005  . ESOPHAGOGASTRODUODENOSCOPY (EGD) WITH PROPOFOL N/A 06/11/2016   Procedure: ESOPHAGOGASTRODUODENOSCOPY (EGD) WITH PROPOFOL;  Surgeon: Garlan Fair, MD;  Location: [facility];  Service: Endoscopy;  Laterality: N/A;  . FIBULA FRACTURE SURGERY  2011   Right Ankle  . FLEXIBLE SIGMOIDOSCOPY N/A 07/09/2016   Procedure: FLEXIBLE SIGMOIDOSCOPY;  Surgeon: Garlan Fair, MD;   Location: [facility];  Service: Endoscopy;  Laterality: N/A;     Medications:  Outpatient Encounter Medications as of 07/28/2017  Medication Sig  . aspirin 81 MG tablet Take 81 mg by mouth daily.    Marland Kitchen atorvastatin (LIPITOR) 80 MG tablet Take 1 tablet (80 mg total) by mouth daily.  . Calcium Citrate-Vitamin D (CALCIUM + D PO) Take 1 tablet by mouth 2 (two) times daily.  . clopidogrel (PLAVIX) 75 MG tablet TAKE 1 TABLET (75 MG TOTAL) BY MOUTH DAILY.  Marland Kitchen dextromethorphan-guaiFENesin (MUCINEX DM) 30-600 MG 12hr tablet Take 2 tablets by mouth daily.  . folic acid (FOLVITE) 1 MG tablet TAKE 1/2 TABLET BY MOUTH EVERY DAY  . hydrocortisone-pramoxine (ANALPRAM HC) 2.5-1 % rectal cream Place 1 application rectally 2 (two) times daily as needed for hemorrhoids or anal itching.  . losartan (COZAAR) 25 MG tablet Take 25 mg by mouth daily.  . Multiple Vitamins-Minerals (MULTIVITAMIN WITH MINERALS) tablet Take 1 tablet by mouth daily.  Marland Kitchen MYRBETRIQ 50 MG TB24 tablet Take 1 tablet by mouth daily.  Marland Kitchen NASONEX 50 MCG/ACT nasal spray 2 sprays by Nasal route Daily.  . nitroGLYCERIN (NITROSTAT) 0.4 MG SL tablet Place 1 tablet (0.4 mg total) under the tongue every 5 (five) minutes as needed for chest pain.  . pantoprazole (PROTONIX) 40 MG tablet Take 1 tablet (40 mg total) by mouth daily.  . sucralfate (CARAFATE) 1 g tablet Take 1 tablet before meals and at bedtime (4 times daily)-make into slurry  . [DISCONTINUED] hydrocortisone-pramoxine (ANALPRAM-HC) 2.5-1 % rectal cream PLACE 1 APPLICATION RETALLY TWICE A DAY AS NEEDED FOR HEMORRHOIDS OR ANAL ITCHING   No facility-administered encounter medications on file as of 07/28/2017.      Allergies:  Allergies  Allergen Reactions  . Cephalexin   . Lisinopril Cough  . Oxycodone     itching  . Penicillins   . Sulfa Drugs Cross Reactors Other (See Comments)    Skin irritation.  . Tramadol     Family History: Family History  Problem Relation Age of Onset  .  Lung cancer Father   . Heart attack Mother   . Heart attack Brother   . Heart disease Brother   . Rectal cancer Brother        only 1 kidney  . Cancer Sister   . Pancreatic cancer Sister   . Heart disease Sister   . Anesthesia problems Neg Hx   . Hypotension Neg Hx   . Malignant hyperthermia Neg Hx   . Pseudochol deficiency Neg Hx   . Breast cancer Neg Hx   . Colon cancer Neg Hx   . Stomach cancer Neg Hx     Social History: Social History   Tobacco Use  . Smoking status: Never Smoker  . Smokeless tobacco: Never Used  Substance Use Topics  . Alcohol use: No  . Drug use: No   Social History   Social History Narrative   Lives alone.  She has two 2 sons.  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    Review of Systems:  CONSTITUTIONAL: No fevers, chills, night sweats, or weight loss.   EYES: No visual changes or eye pain ENT: No hearing changes.  No history of nose bleeds.   RESPIRATORY: No cough, wheezing and shortness of breath.   CARDIOVASCULAR: Negative for chest pain, and palpitations.   GI: Negative for abdominal discomfort, blood in stools or black stools.  No recent change in bowel habits.   GU:  No history of incontinence.   MUSCLOSKELETAL: No history of joint pain or swelling.  No myalgias.   SKIN: Negative for lesions, rash, and itching.   HEMATOLOGY/ONCOLOGY: Negative for prolonged bleeding, bruising easily, and swollen nodes.  No history of cancer.   ENDOCRINE: Negative for cold or heat intolerance, polydipsia or goiter.   PSYCH:  No depression or anxiety symptoms.   NEURO: As Above.   Vital Signs:  BP 130/64   Pulse 81   Ht 5\' 4"  (1.626 m)   Wt 200 lb 8 oz (90.9 kg)   SpO2 96%   BMI 34.42 kg/m    General Medical Exam:   General:  Well appearing, comfortable.   Eyes/ENT: see cranial nerve examination.   Neck: No masses appreciated.  Full range of motion without tenderness.  No carotid bruits. Respiratory:  Clear to  auscultation, good air entry bilaterally.   Cardiac:  Regular rate and rhythm, no murmur.   Extremities:  No deformities, edema, or skin discoloration.  Skin:  No rashes or lesions.  Neurological Exam: MENTAL STATUS including orientation to time, place, person, recent and remote memory, attention span and concentration, language, and fund of knowledge is normal.  Speech is not dysarthric.  CRANIAL NERVES: II:  No visual field defects.  Unremarkable fundi.   III-IV-VI: Pupils equal round and reactive to light.  Normal conjugate, extra-ocular eye movements in all directions of gaze.  No nystagmus.  No ptosis.   V:  Normal facial sensation.    VII:  Normal facial symmetry and movements.  No pathologic facial reflexes.  VIII:  Normal hearing and vestibular function.   IX-X:  Normal palatal movement.   XI:  Normal shoulder shrug and head rotation.   XII:  Normal tongue strength and range of motion, no deviation or fasciculation.  MOTOR:  No atrophy, fasciculations or abnormal movements.  No pronator drift.  Tone is normal.    Right Upper Extremity:    Left Upper Extremity:    Deltoid  5/5   Deltoid  5/5   Biceps  5/5   Biceps  5/5   Triceps  5/5   Triceps  5/5   Wrist extensors  5/5   Wrist extensors  5/5   Wrist flexors  5/5   Wrist flexors  5/5   Finger extensors  5/5   Finger extensors  5/5   Finger flexors  5/5   Finger flexors  5/5   Dorsal interossei  5/5   Dorsal interossei  5/5   Abductor pollicis  5/5   Abductor pollicis  5/5   Tone (Ashworth scale)  0  Tone (Ashworth scale)  0   Right Lower Extremity:    Left Lower Extremity:    Hip flexors  5/5   Hip flexors  5/5   Hip extensors  5/5   Hip extensors  5/5   Knee flexors  5/5   Knee flexors  5/5   Knee extensors  5/5   Knee extensors  5/5   Dorsiflexors  5/5  Dorsiflexors  5/5   Plantarflexors  5/5   Plantarflexors  5/5   Toe extensors  5/5   Toe extensors  5/5   Toe flexors  5-/5   Toe flexors  5-/5   Tone (Ashworth  scale)  0  Tone (Ashworth scale)  0   MSRs:  Right                                                                 Left brachioradialis 2+  brachioradialis 2+  biceps 2+  biceps 2+  triceps 2+  triceps 2+  patellar 2+  patellar 2+  ankle jerk 0  ankle jerk 0  Hoffman no  Hoffman no  plantar response down  plantar response down   SENSORY:  Vibration is reduced to 90% at the great toe bilaterally, temperature and pin prick is mildly reduced over the dorsum of the feet bilaterally.  Proprioception is intact.  There is mild sway with Romberg testing.   COORDINATION/GAIT: Normal finger-to- nose-finger.  Intact rapid alternating movements bilaterally.  Gait appears wide-based, unassisted, and stable.  She is unable to perform stressed or tandem gait unassisted.  IMPRESSION: 1.  Probable idiopathic peripheral neuropathy affecting the feet, worse on the left 2.  Lumbosacral canal stenosis at L4-5 and biforaminal stenosis at L5-S1   PLAN/RECOMMENDATIONS:  She had overlapping symptoms of the above, therefore will order NCS/EMG of the legs to determine the degree of involvement of neuropathy vs lumbosacral radiculopathy.  If there is evidence of neuropathy, additional labs will be checked With her imbalance, I stressed the importance of fall precautions and encouraged her to use gait assist device, at the least, a cane and consider even a rollator, which provides greater stability She will be referred to physical therapy for balance training   Further recommendations pending results   Thank you for allowing me to participate in patient's care.  If I can answer any additional questions, I would be pleased to do so.    Sincerely,    Donika K. Posey Pronto, DO

## 2017-08-01 ENCOUNTER — Telehealth: Payer: Self-pay | Admitting: Neurology

## 2017-08-01 NOTE — Telephone Encounter (Signed)
Spoke with patient and made her aware. She states she will just have PT at [facility]. She already has an order. Will call with any questions/problems.

## 2017-08-01 NOTE — Telephone Encounter (Signed)
Referral faxed to [facility] at [phone] with confirmation received.

## 2017-08-01 NOTE — Telephone Encounter (Signed)
Pt requesting this office to Fax a PT prescription to [facility].

## 2017-08-01 NOTE — Telephone Encounter (Signed)
Beth from [facility] to state they usually do not see pts like this case. She states they are usually referred to the [facility] office or the one on [address] states if there are any questions to call her at [phone].

## 2017-08-01 NOTE — Telephone Encounter (Signed)
Left message on machine for patient to call back.  To let her know they normally don't see this at the orthopaedic office and ask her what she would like to do. Referral was already sent to [facility] on [address] by [place].

## 2017-08-09 ENCOUNTER — Other Ambulatory Visit: Payer: Self-pay | Admitting: Cardiology

## 2017-08-26 ENCOUNTER — Ambulatory Visit (INDEPENDENT_AMBULATORY_CARE_PROVIDER_SITE_OTHER): Payer: Medicare Other | Admitting: Neurology

## 2017-08-26 DIAGNOSIS — R202 Paresthesia of skin: Secondary | ICD-10-CM | POA: Diagnosis not present

## 2017-08-26 DIAGNOSIS — R2681 Unsteadiness on feet: Secondary | ICD-10-CM

## 2017-08-26 DIAGNOSIS — R278 Other lack of coordination: Secondary | ICD-10-CM

## 2017-08-26 DIAGNOSIS — G629 Polyneuropathy, unspecified: Secondary | ICD-10-CM

## 2017-08-26 NOTE — Procedures (Signed)
[facility] Neurology  [address], [place]  [place], [place] [address] Tel: [phone] Fax:  [phone] Test Date:  08/26/2017  Patient: Pamela Gonzales DOB: Mar 10, 1930 Physician: Narda Amber, DO  Sex: Female Height: 5\' 4"  Ref Phys: Narda Amber, DO  ID#: [id number] Temp: 34.9C Technician:    Patient Complaints: This is an 84 year old female referred for evaluation of bilateral feet numbness and imbalance.  NCV & EMG Findings: Extensive electrodiagnostic testing of the right lower extremity and additional studies of the left shows:  1. Bilateral sural and superficial peroneal sensory responses are absent. 2. Bilateral peroneal motor responses are within normal limits. Bilateral tibial motor responses show reduced amplitude (R2.0, L3.6 mV).   3. Bilateral tibial H reflex studies shows prolonged latencies.  4. Chronic motor axon loss changes are seen affecting the muscles below the knee and biceps femoris short head muscles bilaterally.  There is no evidence of active denervation.   Impression: 1. The electrophysiologic findings are most consistent with a chronic and symmetric sensorimotor axonal polyneuropathy affecting the lower extremities. 2. There is a superimposed chronic S1 radiculopathy affecting bilateral lower extremities, mild in degree electrically.   ___________________________ Narda Amber, DO    Nerve Conduction Studies Anti Sensory Summary Table   Site NR Peak (ms) Norm Peak (ms) P-T Amp (V) Norm P-T Amp  Left Sup Peroneal Anti Sensory (Ant Lat Mall)  34.9C  12 cm NR  <4.6  >3  Right Sup Peroneal Anti Sensory (Ant Lat Mall)  34.9C  12 cm NR  <4.6  >3  Left Sural Anti Sensory (Lat Mall)  34.9C  Calf NR  <4.6  >3  Right Sural Anti Sensory (Lat Mall)  34.9C  Calf NR  <4.6  >3   Motor Summary Table   Site NR Onset (ms) Norm Onset (ms) O-P Amp (mV) Norm O-P Amp Site1 Site2 Delta-0 (ms) Dist (cm) Vel (m/s) Norm Vel (m/s)  Left Peroneal Motor (Ext  Dig Brev)  34.9C  Ankle    3.5 <6.0 2.7 >2.5 B Fib Ankle 8.0 34.0 43 >40  B Fib    11.5  2.4  Poplt B Fib 1.3 8.0 62 >40  Poplt    12.8  2.1         Right Peroneal Motor (Ext Dig Brev)  34.9C  Ankle    3.4 <6.0 2.8 >2.5 B Fib Ankle 7.1 36.0 51 >40  B Fib    10.5  2.4  Poplt B Fib 1.0 8.0 80 >40  Poplt    11.5  2.4         Left Tibial Motor (Abd Hall Brev)  34.9C  Ankle    3.7 <6.0 3.6 >4 Knee Ankle 8.3 38.0 46 >40  Knee    12.0  2.1         Right Tibial Motor (Abd Hall Brev)  34.9C  Ankle    3.4 <6.0 2.0 >4 Knee Ankle 8.9 44.0 49 >40  Knee    12.3  0.8          H Reflex Studies   NR H-Lat (ms) Lat Norm (ms) L-R H-Lat (ms)  Left Tibial (Gastroc)  34.9C     37.28 <35 0.00  Right Tibial (Gastroc)  34.9C     37.28 <35 0.00   EMG   Side Muscle Ins Act Fibs Psw Fasc Number Recrt Dur Dur. Amp Amp. Poly Poly. Comment  Right AntTibialis Nml Nml Nml Nml 1- Rapid Some 1+ Some 1+  Some 1+ N/A  Right Gastroc Nml Nml Nml Nml 1- Rapid Some 1+ Some 1+ Some 1+ N/A  Right Flex Dig Long Nml Nml Nml Nml 1- Rapid Some 1+ Some 1+ Some 1+ N/A  Right RectFemoris Nml Nml Nml Nml Nml Nml Nml Nml Nml Nml Nml Nml N/A  Right GluteusMed Nml Nml Nml Nml Nml Nml Nml Nml Nml Nml Nml Nml N/A  Right BicepsFemS Nml Nml Nml Nml 1- Rapid Some 1+ Some 1+ Some 1+ N/A  Left AntTibialis Nml Nml Nml Nml 1- Rapid Some 1+ Some 1+ Some 1+ N/A  Left Gastroc Nml Nml Nml Nml 1- Rapid Some 1+ Some 1+ Some 1+ N/A  Left Flex Dig Long Nml Nml Nml Nml 1- Rapid Some 1+ Some 1+ Some 1+ N/A  Left RectFemoris Nml Nml Nml Nml Nml Nml Nml Nml Nml Nml Nml Nml N/A  Left GluteusMed Nml Nml Nml Nml Nml Nml Nml Nml Nml Nml Nml Nml N/A  Left BicepsFemS Nml Nml Nml Nml 1- Rapid Some 1+ Some 1+ Some 1+ N/A      Waveforms:

## 2017-08-27 ENCOUNTER — Encounter: Payer: Self-pay | Admitting: *Deleted

## 2017-08-27 ENCOUNTER — Other Ambulatory Visit: Payer: Self-pay | Admitting: *Deleted

## 2017-08-27 ENCOUNTER — Telehealth: Payer: Self-pay | Admitting: *Deleted

## 2017-08-27 DIAGNOSIS — R278 Other lack of coordination: Secondary | ICD-10-CM

## 2017-08-27 DIAGNOSIS — G629 Polyneuropathy, unspecified: Secondary | ICD-10-CM

## 2017-08-27 DIAGNOSIS — R202 Paresthesia of skin: Secondary | ICD-10-CM

## 2017-08-27 DIAGNOSIS — R2681 Unsteadiness on feet: Secondary | ICD-10-CM

## 2017-08-27 NOTE — Telephone Encounter (Signed)
-----  Message from Alda Berthold, DO sent at 08/27/2017 12:44 PM EDT ----- Please inform patient that her nerve testing confirm that she has neuropathy as well as nerve impingement in the back at S1 level, but nerve impingement is mild. Her imbalance is due to neuropathy.  We need to check labs to look for potential causes of neuropathy - check ESR, copper, folate, MMA, vitamin B1, SPEP with IFE.  Please also fax EMG results and this telephone note to Dr. Sherwood Gambler, as Juluis Rainier.  If she had additional questions, please return for follow-up visit to address. Thanks.

## 2017-08-27 NOTE — Telephone Encounter (Signed)
Results sent via My Chart.  Note and results faxed to Dr. Sherwood Gambler.

## 2017-08-28 ENCOUNTER — Telehealth: Payer: Self-pay | Admitting: Neurology

## 2017-08-28 NOTE — Telephone Encounter (Signed)
Pamela Gonzales will call patient to come in on Monday so that Dr. Posey Pronto can address her questions.

## 2017-08-28 NOTE — Telephone Encounter (Signed)
Py has questions regarding results of EMG.

## 2017-08-29 ENCOUNTER — Other Ambulatory Visit (INDEPENDENT_AMBULATORY_CARE_PROVIDER_SITE_OTHER): Payer: Medicare Other

## 2017-08-29 DIAGNOSIS — R202 Paresthesia of skin: Secondary | ICD-10-CM

## 2017-08-29 DIAGNOSIS — R278 Other lack of coordination: Secondary | ICD-10-CM

## 2017-08-29 DIAGNOSIS — G629 Polyneuropathy, unspecified: Secondary | ICD-10-CM | POA: Diagnosis not present

## 2017-08-29 DIAGNOSIS — R2681 Unsteadiness on feet: Secondary | ICD-10-CM

## 2017-08-29 LAB — SEDIMENTATION RATE: SED RATE: 67 mm/h — AB (ref 0–30)

## 2017-08-29 LAB — FOLATE: Folate: >24.1 ng/mL (ref >5.9)

## 2017-09-01 ENCOUNTER — Encounter: Payer: Self-pay | Admitting: Neurology

## 2017-09-01 ENCOUNTER — Ambulatory Visit: Payer: Medicare Other | Admitting: Neurology

## 2017-09-01 VITALS — BP 140/70 | HR 92 | Ht 64.0 in | Wt 197.2 lb

## 2017-09-01 DIAGNOSIS — G629 Polyneuropathy, unspecified: Secondary | ICD-10-CM | POA: Diagnosis not present

## 2017-09-01 NOTE — Progress Notes (Signed)
Follow-up Visit   Date: 09/01/17    Pamela Gonzales MRN: [id number] DOB: 11-25-1930   Interim History: Pamela Gonzales is a 84 y.o. right-handed -handed Caucasian female with hyperlipidemia, hypertension, CAD, GERD, OA, SLE, osteoporosis returning to the clinic for follow-up of peripheral neuropathy.  The patient was accompanied to the clinic by friend who also provides collateral information.  She lives at [facility].  History of present illness: Starting in 2016, she began having "thickening sensation" over the toes and entire foot as well as imbalance and was stumbling more frequently.  She suffered two falls in the past few years and bruised her ribs on the left.  With prolonged standing, she has weakness in the left foot and feels lack of control.  Symptoms are intermittent and always triggered by standing and walking. There is no pain or tingling in the left foot or low back pain.  She has instability with gait and walks unassisted, but relies heavily on grab bars in the hallway. Occasionally, she has spasm in the back, no leg cramps.  She does not have similar problems in the right leg.    She stays very active and goes to the gym three times per week.   She has no personal history of diabetes, family history of neuropathy, no heavy alcohol consumption.  She saw her PCP for these symptoms and had vitmain B12 and TSH levels checked which were normal. She was evaluated by Dr. Sherwood Gambler initially for these symptoms who ordered MRI lumbar spine which showed degenerative changes with severe lumbar canal stenosis at L4-5 and severe biforaminal stenosis at L5-S1.  UPDATE 09/01/2017:  She is here today to review the results of her NCS/EMG which confirms the presence of a sensorimotor polyneuropathy as well as bilateral S1 radiculopathy.  She continues to be very active and engages in exercise almost daily.  She will be getting a cane tomorrow, but is very reluctant to want to  use it. Her numbness of the toes is slightly better than when she was here last.  No interval falls or hospitalizations.  Medications:  Current Outpatient Medications on File Prior to Visit  Medication Sig Dispense Refill  . aspirin 81 MG tablet Take 81 mg by mouth daily.      Marland Kitchen atorvastatin (LIPITOR) 40 MG tablet TAKE 1 TABLET BY MOUTH EVERY DAY 90 tablet 3  . atorvastatin (LIPITOR) 80 MG tablet Take 1 tablet (80 mg total) by mouth daily. 90 tablet 3  . Calcium Citrate-Vitamin D (CALCIUM + D PO) Take 1 tablet by mouth 2 (two) times daily.    . clopidogrel (PLAVIX) 75 MG tablet TAKE 1 TABLET (75 MG TOTAL) BY MOUTH DAILY. 90 tablet 3  . dextromethorphan-guaiFENesin (MUCINEX DM) 30-600 MG 12hr tablet Take 2 tablets by mouth daily.    . folic acid (FOLVITE) 1 MG tablet TAKE 1/2 TABLET BY MOUTH EVERY DAY 45 tablet 3  . hydrocortisone-pramoxine (ANALPRAM HC) 2.5-1 % rectal cream Place 1 application rectally 2 (two) times daily as needed for hemorrhoids or anal itching. 30 g 1  . losartan (COZAAR) 25 MG tablet Take 25 mg by mouth daily.    . Multiple Vitamins-Minerals (MULTIVITAMIN WITH MINERALS) tablet Take 1 tablet by mouth daily.    Marland Kitchen MYRBETRIQ 50 MG TB24 tablet Take 1 tablet by mouth daily.  2  . NASONEX 50 MCG/ACT nasal spray 2 sprays by Nasal route Daily.    . nitroGLYCERIN (NITROSTAT) 0.4 MG SL tablet Place  1 tablet (0.4 mg total) under the tongue every 5 (five) minutes as needed for chest pain. 25 tablet 11  . pantoprazole (PROTONIX) 40 MG tablet Take 1 tablet (40 mg total) by mouth daily. 30 tablet 4  . sucralfate (CARAFATE) 1 g tablet Take 1 tablet before meals and at bedtime (4 times daily)-make into slurry 120 tablet 3   No current facility-administered medications on file prior to visit.     Allergies:  Allergies  Allergen Reactions  . Cephalexin   . Lisinopril Cough  . Oxycodone     itching  . Penicillins   . Sulfa Drugs Cross Reactors Other (See Comments)    Skin irritation.   . Tramadol     Review of Systems:  CONSTITUTIONAL: No fevers, chills, night sweats, or weight loss.  EYES: No visual changes or eye pain ENT: No hearing changes.  No history of nose bleeds.   RESPIRATORY: No cough, wheezing and shortness of breath.   CARDIOVASCULAR: Negative for chest pain, and palpitations.   GI: Negative for abdominal discomfort, blood in stools or black stools.  No recent change in bowel habits.   GU:  No history of incontinence.   MUSCLOSKELETAL: No history of joint pain or swelling.  No myalgias.   SKIN: Negative for lesions, rash, and itching.   ENDOCRINE: Negative for cold or heat intolerance, polydipsia or goiter.   PSYCH:  No depression or anxiety symptoms.   NEURO: As Above.   Vital Signs:  BP 140/70   Pulse 92   Ht 5' 4"  (1.626 m)   Wt 197 lb 4 oz (89.5 kg)   SpO2 96%   BMI 33.86 kg/m   Formal exam is deferred. She is well appearing, face is symmetric. All extremities are antigravity.  Gait appears stable, unassisted.  Data: NCS/EMG of the leg 08/26/2017: 1. The electrophysiologic findings are most consistent with a chronic and symmetric sensorimotor axonal polyneuropathy affecting the lower extremities. 2. There is a superimposed chronic S1 radiculopathy affecting bilateral lower extremities, mild in degree electrically.  MRI lumbar spine 01/2017:  Degenerative changes at L1-2 and L2-3 levels, mild disc bulbing and canal stenosis at L3-4 level.  Significant degeneration at L4-5 and L5-S1, progressed from 2012.  At L4-5 level, there is severe hypertrophic facet arthropathy, ligamentum flavum thickening, circumfrential disc bulbing, and marked to severe multifactorial canal stenosis.  At L5-S1, there is circumfrential disc bulbing, hypertrophic facet atrhropathy, ligamentum flavum thickening, and mild canal but marked to severe lateral recess stenosis bilaterally at L5-S1 level. Labs 04/28/2017:  TSH 1.89, vitamin B12 1140  IMPRESSION/PLAN: Peripheral  neuropathy manifesting with primarily numbness of the toes/feet. I had extensive discussion with the patient regarding the pathogenesis, etiology, management, and natural course of neuropathy. Neuropathy tends to be slowly progressive, especially if a treatable etiology is not identified.  She has labs in process looking for treatable causes of neuropathy. I discussed that in the vast majority of cases, despite checking for reversible causes, we are unable to find the underlying etiology and management is symptomatic.  Her copper and folate is normal.  ESR is elevated at 67 and likely associated with know history of lupus.  MMA, vitamin B1, and SPEP with IFE is pending.  The mainstay of management is optimizing her balance with PT exercises and pain control, which fortunately she denies.   Unfortunately, there is no treatment for numbness which is her primary complaint.  Fall precautions discussed and she was encouraged to use a cane, especially  on uneven ground.    She also has known chronic L5-S1 radiculopathy and has not been deemed a surgical candidate by Dr. Sherwood Gambler.  Again, I stressed the importance of PT for low back strengthening.  Although it may be contributing to her feet paresthesias to some degree, the primary disease process causing numbness is her neuropathy.  She had many questions which were answered to the best of my ability.  The duration of this appointment visit was 25 minutes of face-to-face time with the patient.  Greater than 50% of this time was spent in counseling, explanation of diagnosis, planning of further management, and coordination of care.   Thank you for allowing me to participate in patient's care.  If I can answer any additional questions, I would be pleased to do so.    Sincerely,    Donika K. Posey Pronto, DO

## 2017-09-03 LAB — VITAMIN B1: VITAMIN B1 (THIAMINE): 19 nmol/L (ref 8–30)

## 2017-09-03 LAB — PROTEIN ELECTROPHORESIS, SERUM
ALBUMIN ELP: 3.5 g/dL — AB (ref 3.8–4.8)
Alpha 1: 0.3 g/dL (ref 0.2–0.3)
Alpha 2: 0.8 g/dL (ref 0.5–0.9)
BETA 2: 0.3 g/dL (ref 0.2–0.5)
Beta Globulin: 0.5 g/dL (ref 0.4–0.6)
Gamma Globulin: 0.8 g/dL (ref 0.8–1.7)
TOTAL PROTEIN: 6.2 g/dL (ref 6.1–8.1)

## 2017-09-03 LAB — IMMUNOFIXATION ELECTROPHORESIS
IGM, SERUM: 87 mg/dL (ref 50–300)
IMMUNOGLOBULIN A: 177 mg/dL (ref 20–320)
IgG (Immunoglobin G), Serum: 885 mg/dL (ref 600–1540)
Immunofix Electr Int: NOT DETECTED

## 2017-09-03 LAB — METHYLMALONIC ACID, SERUM: Methylmalonic Acid, Quant: 134 nmol/L (ref 87–318)

## 2017-09-03 LAB — COPPER, SERUM: Copper: 123 ug/dL (ref 70–175)

## 2017-09-12 ENCOUNTER — Telehealth: Payer: Self-pay | Admitting: Cardiology

## 2017-09-12 DIAGNOSIS — E7800 Pure hypercholesterolemia, unspecified: Secondary | ICD-10-CM

## 2017-09-12 MED ORDER — ROSUVASTATIN CALCIUM 40 MG PO TABS: 40.0000 mg | ORAL_TABLET | Freq: Every day | ORAL | 3 refills | Status: DC

## 2017-09-12 NOTE — Telephone Encounter (Signed)
New Message    Pt c/o medication issue:  1. Name of Medication: atorvastatin (LIPITOR) 80 MG tablet  2. How are you currently taking this medication (dosage and times per day)?Take 1 tablet (80 mg total) by mouth daily.  3. Are you having a reaction (difficulty breathing--STAT)?tired and fatigue  4. What is your medication issue? Patient states that her lipitor was increased in May from 40 to 80 Since the increase she has been very fatigued and tired. Please call.

## 2017-09-12 NOTE — Telephone Encounter (Signed)
Patient was called and made aware of change, and labs ordered verified with patient when to come in to have drawn.   Patient verbalized understanding.  Will forward to Dr.Jordan to make him aware.   Thanks!

## 2017-09-12 NOTE — Telephone Encounter (Signed)
Called patient regarding her medication Lipitor. Patient states that she was changed from 40mg  to 80mg  back in May by Dr.Jordan. She states that about a month after that change and since then she has felt very fatigued, having no energy to do anything.  Patient states that she had blood work completed and it showed that she was mildly anemic and she started on a iron pill, ferrox 160mg . Patient states that she does not think her low energy and fatigue is only coming from the anemia and would like to know what she should do regarding her Lipitor dose. Patient is scheduled for more lab work on 08/26.   Please advise.

## 2017-09-12 NOTE — Telephone Encounter (Signed)
Given muscle aches on higher dose and LDL not at goal on lower dose would recommend stop atorvastatin and change to rosuvastatin 40mg  daily (which can be better tolerated and is potent on LDL). Repeat lipid/hepatic panel in 2 months.

## 2017-09-14 NOTE — Telephone Encounter (Signed)
Agree  Peter Jordan MD, FACC   

## 2017-09-15 ENCOUNTER — Telehealth: Payer: Self-pay | Admitting: Cardiology

## 2017-09-15 NOTE — Telephone Encounter (Signed)
Returned call to patient she stated she wanted to make sure Dr.Jordan knew Lipitor was stopped and Crestor started.Advised Dr.Jordan is aware.Advised she will need fasting lipid and hepatic panels in 3 months.Patient stated she has lab order and will have done.

## 2017-09-15 NOTE — Telephone Encounter (Signed)
New message  Pt c/o medication issue:  1. Name of Medication: rosuvastatin (CRESTOR) 40 MG tablet  2. How are you currently taking this medication (dosage and times per day)? Once daily at night   3. Are you having a reaction (difficulty breathing--STAT)? No   4. What is your medication issue? Patient wants Dr. Martinique to know that she is on this medication and she wants to make sure that he agrees with the change in her medication from the lipitor.

## 2017-09-28 ENCOUNTER — Other Ambulatory Visit: Payer: Self-pay | Admitting: Cardiology

## 2017-10-06 ENCOUNTER — Encounter: Payer: Self-pay | Admitting: Internal Medicine

## 2017-10-06 ENCOUNTER — Ambulatory Visit: Payer: Medicare Other | Admitting: Internal Medicine

## 2017-10-06 VITALS — BP 112/60 | HR 84 | Ht 64.0 in | Wt 198.4 lb

## 2017-10-06 DIAGNOSIS — K219 Gastro-esophageal reflux disease without esophagitis: Secondary | ICD-10-CM

## 2017-10-06 DIAGNOSIS — K5900 Constipation, unspecified: Secondary | ICD-10-CM

## 2017-10-06 DIAGNOSIS — K649 Unspecified hemorrhoids: Secondary | ICD-10-CM

## 2017-10-06 DIAGNOSIS — K449 Diaphragmatic hernia without obstruction or gangrene: Secondary | ICD-10-CM | POA: Diagnosis not present

## 2017-10-06 NOTE — Patient Instructions (Signed)
Please continue pantoprazole 40 mg daily.  Please purchase the following medications over the counter and take as directed: Recticare-Apply to rectum as per box instructions.  Please follow up with Dr Hilarie Fredrickson in December 2019 (call late October to schedule).  If you are age 82 or older, your body mass index should be between 23-30. Your Body mass index is 34.06 kg/m. If this is out of the aforementioned range listed, please consider follow up with your Primary Care Provider.  If you are age 40 or younger, your body mass index should be between 19-25. Your Body mass index is 34.06 kg/m. If this is out of the aformentioned range listed, please consider follow up with your Primary Care Provider.

## 2017-10-06 NOTE — Progress Notes (Signed)
Subjective:    Patient ID: Pamela Gonzales, female    DOB: Mar 08, 1930, 84 y.o.   MRN: [id number]  HPI Wilmoth Rasnic is an 84 year old female with a history of GERD with complex hiatal hernia with Cameron's erosions, history of gastritis, history of diverticulosis, history of colon polyp, history of mild constipation is here for follow-up.  She was last seen on 06/09/2017.  She reports that she still is having some lower bowel or abdominal discomfort after bowel movement.  She feels that she needs a medication to help soothe her anus and rectum.  The Analpram which we tried she reports was basically useless and not soothing enough.  Over-the-counter Preparation H cream and the generic version from [facility] seems to work best.  She is read about and inquires about RectiCare which she thinks may help but has not tried yet.  She has not responded well to laxatives and feels that she has a "lazy rectum".  She is eating for warm prunes at night and this is helping her stay regular from a bowel perspective.  She will occasionally feel some mild nausea and upper abdominal pressure.  She states on 2 occasions in the last 3 to 5 months she has had episodes of vomiting but this has been very infrequent.  She states she has been craving fast food such as [employer] and [employer].  No weight loss.  No blood in her stool or melena  She is taking pantoprazole 40 mg daily and reports this is working very well.  She is not using Carafate any longer.  She continues Plavix daily  She was recently diagnosed with bilateral peripheral neuropathy and is seeing Dr. Posey Pronto with neurology.  Review of Systems As per HPI, otherwise negative  Current Medications, Allergies, Past Medical History, Past Surgical History, Family History and Social History were reviewed in [employer] record.     Objective:   Physical Exam BP 112/60   Pulse 84   Ht 5\' 4"  (1.626 m)   Wt 198 lb 6.4 oz (90 kg)   BMI 34.06 kg/m    Constitutional: Well-developed and well-nourished. No distress. HEENT: Normocephalic and atraumatic. No scleral icterus. Neurological: Alert and oriented to person place and time. Skin: Skin is warm and dry. Psychiatric: Normal mood and affect. Behavior is normal.  CBC    Component Value Date/Time   WBC 5.9 07/08/2016 0838   RBC 4.31 07/08/2016 0838   HGB 12.5 07/08/2016 0838   HCT 39.3 07/08/2016 0838   PLT 224 07/08/2016 0838   MCV 91.2 07/08/2016 0838   MCH 29.0 07/08/2016 0838   MCHC 31.8 (L) 07/08/2016 0838   RDW 14.9 07/08/2016 0838   LYMPHSABS 1,062 07/08/2016 0838   MONOABS 590 07/08/2016 0838   EOSABS 118 07/08/2016 0838   BASOSABS 59 07/08/2016 0838      Assessment & Plan:  84 year old female with a history of GERD with complex hiatal hernia with Cameron's erosions, history of gastritis, history of diverticulosis, history of colon polyp, history of mild constipation is here for follow-up. '  1.  Mild constipation/external hemorrhoids --she is treating her constipation by dietary measures, eating prunes daily.  She will continue with this dietary measure.  Also certainly okay to use RectiCare and we discussed this today.  Samples provided.  Use per box instruction.  Can continue [facility] brand Preparation H cream.  I expect she has an element of pelvic floor dysfunction.  2.  GERD with hiatal hernia and upper  abdominal discomfort --we discussed how her large hiatal hernia could be repaired surgically.  She is not interested.  This is understandable.  Continue pantoprazole 40 mg daily.  Smaller more frequent meals recommended versus larger meals and overeating.  3.  Peripheral neuropathy --emotionally upset about this diagnosis and concern about using a cane.  Reassurance provided today.  Follow-up in December, sooner if needed.  Patient asked to call us in October for that December appointment  25 minutes spent with the patient today. Greater than 50% was spent in counseling  and coordination of care with the patient

## 2017-11-11 ENCOUNTER — Telehealth: Payer: Self-pay | Admitting: Internal Medicine

## 2017-11-11 NOTE — Telephone Encounter (Signed)
I have spoken to patient to advise that we truly do not yet have a December schedule out at this time. However, if she calls back in 2 weeks or so, we should have one available at that time. We will work with her to make sure we get her in.

## 2017-11-11 NOTE — Telephone Encounter (Signed)
Pt would like to speak with you regarding an appt in December. I explained to her that schedule is not yet available but she insisted in speaking with you. Pls call her.

## 2017-11-14 ENCOUNTER — Telehealth: Payer: Self-pay | Admitting: Neurology

## 2017-11-14 NOTE — Telephone Encounter (Signed)
Patient is needing a call back from [place] the nurse. She did not want to explain why. Please call her back at [phone]. Thanks!

## 2017-11-14 NOTE — Telephone Encounter (Signed)
Patient states that her hamstrings are hurting and wants to know if it is neuropathy.

## 2017-11-17 NOTE — Telephone Encounter (Signed)
Left message informing patient that the pain in her hamstrings is not from neuropathy per Dr. Posey Pronto.

## 2017-11-19 ENCOUNTER — Other Ambulatory Visit: Payer: Self-pay | Admitting: Cardiology

## 2017-12-05 ENCOUNTER — Telehealth: Payer: Self-pay | Admitting: Cardiology

## 2017-12-05 NOTE — Telephone Encounter (Signed)
New Message   Patient is calling because she is due to see Dr. Martinique on 11/27. She wants to know should she have lab work prior since there has been change in her medication. She only wants to speak to [place] though. A voicemail can be left if she is not available with the response.

## 2017-12-05 NOTE — Telephone Encounter (Signed)
Returned call to patient she stated she started Crestor 40 mg daily 09/12/17.Advised she will need fasting lipid and hepatic panels the end of November.Stated she will have done before her appointment with Dr.Jordan 12/24/17.

## 2017-12-15 ENCOUNTER — Other Ambulatory Visit: Payer: Self-pay | Admitting: Obstetrics and Gynecology

## 2017-12-15 DIAGNOSIS — Z1231 Encounter for screening mammogram for malignant neoplasm of breast: Secondary | ICD-10-CM

## 2017-12-15 DIAGNOSIS — Z780 Asymptomatic menopausal state: Secondary | ICD-10-CM

## 2017-12-19 LAB — HEPATIC FUNCTION PANEL
ALK PHOS: 51 IU/L (ref 39–117)
ALT: 18 IU/L (ref 0–32)
AST: 23 IU/L (ref 0–40)
Albumin: 4.1 g/dL (ref 3.5–4.7)
BILIRUBIN TOTAL: 0.5 mg/dL (ref 0.0–1.2)
BILIRUBIN, DIRECT: 0.13 mg/dL (ref 0.00–0.40)
TOTAL PROTEIN: 6.3 g/dL (ref 6.0–8.5)

## 2017-12-19 LAB — LIPID PANEL W/O CHOL/HDL RATIO
CHOLESTEROL TOTAL: 165 mg/dL (ref 100–199)
HDL: 58 mg/dL (ref >39)
LDL Calculated: 87 mg/dL (ref 0–99)
TRIGLYCERIDES: 101 mg/dL (ref 0–149)
VLDL Cholesterol Cal: 20 mg/dL (ref 5–40)

## 2017-12-19 NOTE — Progress Notes (Signed)
Pamela Gonzales Date of Birth: 10-May-1930 Medical Record #[id number]  History of Present Illness: Pamela Gonzales is seen back today for follow up of CAD. She has a history of HLD, Lupus, GERD, OA, RBBB and CAD with past stents placed in 2005. She had stenting of the mid LAD and 2nd OM in 2005 with Cypher stents. Last Myoview was in June 2017 and was normal. She has remained on Plavix.   On follow up today she is doing  Very well from a cardiac standpoint.   She remains very active with water aerobics 3x/week. She has developed neuropathy in her feet. She is using a cane. She reports she is anemic. Last Hgb 10.4. No clear bleeding. She denies any chest pain or dyspnea. No edema.   Current Outpatient Medications  Medication Sig Dispense Refill  . aspirin 81 MG tablet Take 81 mg by mouth daily.      . Calcium Citrate-Vitamin D (CALCIUM + D PO) Take 1 tablet by mouth 2 (two) times daily.    . clopidogrel (PLAVIX) 75 MG tablet TAKE 1 TABLET BY MOUTH EVERY DAY 90 tablet 1  . dextromethorphan-guaiFENesin (MUCINEX DM) 30-600 MG 12hr tablet Take 2 tablets by mouth daily.    . folic acid (FOLVITE) 1 MG tablet TAKE 1/2 TABLET BY MOUTH EVERY DAY 45 tablet 6  . hydrocortisone-pramoxine (ANALPRAM HC) 2.5-1 % rectal cream Place 1 application rectally 2 (two) times daily as needed for hemorrhoids or anal itching. 30 g 1  . losartan (COZAAR) 25 MG tablet Take 25 mg by mouth daily.    . Multiple Vitamins-Minerals (MULTIVITAMIN WITH MINERALS) tablet Take 1 tablet by mouth daily.    Marland Kitchen MYRBETRIQ 50 MG TB24 tablet Take 1 tablet by mouth daily.  2  . NASONEX 50 MCG/ACT nasal spray 2 sprays by Nasal route Daily.    . nitroGLYCERIN (NITROSTAT) 0.4 MG SL tablet Place 1 tablet (0.4 mg total) under the tongue every 5 (five) minutes as needed for chest pain. 25 tablet 11  . pantoprazole (PROTONIX) 40 MG tablet Take 1 tablet (40 mg total) by mouth daily. 30 tablet 4  . rosuvastatin (CRESTOR) 40 MG tablet Take 1 tablet (40 mg  total) by mouth daily. 30 tablet 3  . sucralfate (CARAFATE) 1 g tablet Take 1 tablet before meals and at bedtime (4 times daily)-make into slurry (Patient taking differently: Take 1 g by mouth 4 (four) times daily as needed. Take 1 tablet before meals and at bedtime (4 times daily)-make into slurry) 120 tablet 3   No current facility-administered medications for this visit.     Allergies  Allergen Reactions  . Cephalexin   . Lisinopril Cough  . Oxycodone     itching  . Penicillins   . Sulfa Drugs Cross Reactors Other (See Comments)    Skin irritation.  . Tramadol     Past Medical History:  Diagnosis Date  . Arthritis   . Asthmatic bronchitis   . Benign fundic gland polyps of stomach   . CAD (coronary artery disease) 05   stents  . Diverticulosis   . Esophageal dysmotility   . Fibula fracture    right  . Gallstones   . Gastric erosions   . GERD (gastroesophageal reflux disease)   . Hiatal hernia   . Hypercholesteremia   . Internal hemorrhoids   . Lupus ([place])   . Osteoarthritis   . Osteoporosis   . RBBB   . Spinal stenosis   . Tubular  adenoma of colon     Past Surgical History:  Procedure Laterality Date  . ABDOMINAL HYSTERECTOMY  1975  . BREAST EXCISIONAL BIOPSY Right   . BREAST LUMPECTOMY  1970  . CATARACT EXTRACTION, BILATERAL    . CHOLECYSTECTOMY  07/04/2011   Procedure: LAPAROSCOPIC CHOLECYSTECTOMY WITH INTRAOPERATIVE CHOLANGIOGRAM;  Surgeon: Merrie Roof, MD;  Location: [facility];  Service: General;  Laterality: N/A;  . COLONOSCOPY W/ BIOPSIES    . CORONARY ANGIOPLASTY  2005  . ESOPHAGOGASTRODUODENOSCOPY (EGD) WITH PROPOFOL N/A 06/11/2016   Procedure: ESOPHAGOGASTRODUODENOSCOPY (EGD) WITH PROPOFOL;  Surgeon: Garlan Fair, MD;  Location: [facility];  Service: Endoscopy;  Laterality: N/A;  . FIBULA FRACTURE SURGERY  2011   Right Ankle  . FLEXIBLE SIGMOIDOSCOPY N/A 07/09/2016   Procedure: FLEXIBLE SIGMOIDOSCOPY;  Surgeon: Garlan Fair, MD;  Location:  [facility];  Service: Endoscopy;  Laterality: N/A;    Social History   Tobacco Use  Smoking Status Never Smoker  Smokeless Tobacco Never Used    Social History   Substance and Sexual Activity  Alcohol Use No    Family History  Problem Relation Age of Onset  . Lung cancer Father   . Heart attack Mother   . Heart attack Brother   . Heart disease Brother   . Rectal cancer Brother        only 1 kidney  . Cancer Sister   . Pancreatic cancer Sister   . Heart disease Sister   . Anesthesia problems Neg Hx   . Hypotension Neg Hx   . Malignant hyperthermia Neg Hx   . Pseudochol deficiency Neg Hx   . Breast cancer Neg Hx   . Colon cancer Neg Hx   . Stomach cancer Neg Hx     Review of Systems: The review of systems is per the HPI.  All other systems were reviewed and are negative.  Physical Exam: BP (!) 152/64   Pulse 92   Ht 5\' 4"  (1.626 m)   Wt 198 lb (89.8 kg)   SpO2 97%   BMI 33.99 kg/m  GENERAL:  Well appearing, obese WF in NAD HEENT:  PERRL, EOMI, sclera are clear. Oropharynx is clear. NECK:  No jugular venous distention, carotid upstroke brisk and symmetric, no bruits, no thyromegaly or adenopathy LUNGS:  Clear to auscultation bilaterally CHEST:  Unremarkable HEART:  RRR,  PMI not displaced or sustained,S1 and S2 within normal limits, no S3, no S4: no clicks, no rubs, no murmurs EXT:  2 + pulses throughout, no edema, no cyanosis no clubbing SKIN:  Warm and dry.  No rashes NEURO:  Alert and oriented x 3. Cranial nerves II through XII intact. PSYCH:  Cognitively intact  LABORATORY DATA:  Lab Results  Component Value Date   WBC 5.9 07/08/2016   HGB 12.5 07/08/2016   HCT 39.3 07/08/2016   PLT 224 07/08/2016   GLUCOSE 93 06/26/2017   CHOL 165 12/19/2017   TRIG 101 12/19/2017   HDL 58 12/19/2017   LDLCALC 87 12/19/2017   ALT 18 12/19/2017   AST 23 12/19/2017   NA 143 06/26/2017   K 4.4 06/26/2017   CL 107 (H) 06/26/2017   CREATININE 0.81 06/26/2017     BUN 12 06/26/2017   CO2 23 06/26/2017   INR 1.07 08/03/2009   HGBA1C 5.4 05/10/2010    Myoview study 07/26/15:Study Highlights    The left ventricular ejection fraction is hyperdynamic (>65%).  Nuclear stress EF: 72%.  There was no ST  segment deviation noted during stress.  The study is normal.  This is a low risk study.   This is a normal pharmacologic nuclear study with no evidence of prior infarct or ischemia. Normal LVEF.       Assessment / Plan: 1. CAD - s/p stenting of the LAD and OM in 2005 with first generation Cypher stents. Normal Myoview in June 2017.  She is asymptomatic. Since she has first generation DES we have continued  DAPT indefinitely. If she should have evidence of bleeding leading to her anemia I would favor stopping Plavix.  2. HTN - BP is elevated today but has been well controlled. Will monitor on current therapy.  3. HLD - on statin. LDL 89. Goal 70. On maximal dose of Crestor. Would like to avoid additional medical therapy.  4. Chronic RBBB since 2014.    I will follow up in 6 months

## 2017-12-24 ENCOUNTER — Encounter: Payer: Self-pay | Admitting: Cardiology

## 2017-12-24 ENCOUNTER — Ambulatory Visit: Payer: Medicare Other | Admitting: Cardiology

## 2017-12-24 VITALS — BP 152/64 | HR 92 | Ht 64.0 in | Wt 198.0 lb

## 2017-12-24 DIAGNOSIS — I10 Essential (primary) hypertension: Secondary | ICD-10-CM

## 2017-12-24 DIAGNOSIS — I4510 Unspecified right bundle-branch block: Secondary | ICD-10-CM

## 2017-12-24 DIAGNOSIS — E7800 Pure hypercholesterolemia, unspecified: Secondary | ICD-10-CM | POA: Diagnosis not present

## 2017-12-24 DIAGNOSIS — I2510 Atherosclerotic heart disease of native coronary artery without angina pectoris: Secondary | ICD-10-CM | POA: Diagnosis not present

## 2017-12-31 ENCOUNTER — Other Ambulatory Visit: Payer: Self-pay | Admitting: Cardiology

## 2018-01-01 ENCOUNTER — Encounter: Payer: Self-pay | Admitting: *Deleted

## 2018-01-09 ENCOUNTER — Other Ambulatory Visit (INDEPENDENT_AMBULATORY_CARE_PROVIDER_SITE_OTHER): Payer: Medicare Other

## 2018-01-09 ENCOUNTER — Encounter: Payer: Self-pay | Admitting: Internal Medicine

## 2018-01-09 ENCOUNTER — Ambulatory Visit: Payer: Medicare Other | Admitting: Internal Medicine

## 2018-01-09 VITALS — BP 130/70 | HR 76 | Ht 64.0 in | Wt 198.0 lb

## 2018-01-09 DIAGNOSIS — R109 Unspecified abdominal pain: Secondary | ICD-10-CM

## 2018-01-09 DIAGNOSIS — D649 Anemia, unspecified: Secondary | ICD-10-CM

## 2018-01-09 DIAGNOSIS — K5900 Constipation, unspecified: Secondary | ICD-10-CM

## 2018-01-09 LAB — CBC WITH DIFFERENTIAL/PLATELET
Basophils Absolute: 0 10*3/uL (ref 0.0–0.1)
Basophils Relative: 0.5 % (ref 0.0–3.0)
Eosinophils Absolute: 0.5 10*3/uL (ref 0.0–0.7)
Eosinophils Relative: 5.4 % — ABNORMAL HIGH (ref 0.0–5.0)
HCT: 32.6 % — ABNORMAL LOW (ref 36.0–46.0)
Hemoglobin: 10.4 g/dL — ABNORMAL LOW (ref 12.0–15.0)
LYMPHS ABS: 1.8 10*3/uL (ref 0.7–4.0)
LYMPHS PCT: 21 % (ref 12.0–46.0)
MCHC: 31.8 g/dL (ref 30.0–36.0)
MCV: 81.7 fl (ref 78.0–100.0)
MONOS PCT: 11.6 % (ref 3.0–12.0)
Monocytes Absolute: 1 10*3/uL (ref 0.1–1.0)
NEUTROS ABS: 5.2 10*3/uL (ref 1.4–7.7)
Neutrophils Relative %: 61.5 % (ref 43.0–77.0)
Platelets: 225 10*3/uL (ref 150.0–400.0)
RBC: 3.99 Mil/uL (ref 3.87–5.11)
RDW: 16.7 % — ABNORMAL HIGH (ref 11.5–15.5)
WBC: 8.5 10*3/uL (ref 4.0–10.5)

## 2018-01-09 LAB — COMPREHENSIVE METABOLIC PANEL
ALT: 15 U/L (ref 0–35)
AST: 21 U/L (ref 0–37)
Albumin: 3.9 g/dL (ref 3.5–5.2)
Alkaline Phosphatase: 43 U/L (ref 39–117)
BUN: 14 mg/dL (ref 6–23)
CALCIUM: 9.3 mg/dL (ref 8.4–10.5)
CO2: 27 meq/L (ref 19–32)
Chloride: 104 mEq/L (ref 96–112)
Creatinine, Ser: 0.82 mg/dL (ref 0.40–1.20)
GFR: 70.35 mL/min (ref >60.00)
GLUCOSE: 105 mg/dL — AB (ref 70–99)
Potassium: 3.8 mEq/L (ref 3.5–5.1)
Sodium: 139 mEq/L (ref 135–145)
Total Bilirubin: 0.6 mg/dL (ref 0.2–1.2)
Total Protein: 6.6 g/dL (ref 6.0–8.3)

## 2018-01-09 LAB — FERRITIN: Ferritin: 6.2 ng/mL — ABNORMAL LOW (ref 10.0–291.0)

## 2018-01-09 LAB — IRON: Iron: 21 ug/dL — ABNORMAL LOW (ref 42–145)

## 2018-01-09 NOTE — Patient Instructions (Addendum)
Your provider has requested that you go to the basement level for lab work before leaving today. Press "B" on the elevator. The lab is located at the first door on the left as you exit the elevator.  You have been scheduled for a CT scan of the abdomen and pelvis at [facility] ([address]---this is in the same building as [facility]).   You are scheduled on 01/26/18 at 3:15 pm. You should arrive 15 minutes prior to your appointment time for registration. Please follow the written instructions below on the day of your exam:  WARNING: IF YOU ARE ALLERGIC TO IODINE/X-RAY DYE, PLEASE NOTIFY RADIOLOGY IMMEDIATELY AT [phone]! YOU WILL BE GIVEN A 13 HOUR PREMEDICATION PREP.  1) Do not eat or drink anything after 11:15 am (4 hours prior to your test) 2) You have been given 2 bottles of oral contrast to drink. The solution may taste better if refrigerated, but do NOT add ice or any other liquid to this solution. Shake well before drinking.    Drink 1 bottle of contrast @ 1:15 pm (2 hours prior to your exam)  Drink 1 bottle of contrast @ 2:15 pm (1 hour prior to your exam)  You may take any medications as prescribed with a small amount of water, if necessary. If you take any of the following medications: METFORMIN, GLUCOPHAGE, GLUCOVANCE, AVANDAMET, RIOMET, FORTAMET, [place] MET, JANUMET, GLUMETZA or METAGLIP, you MAY be asked to HOLD this medication 48 hours AFTER the exam.  The purpose of you drinking the oral contrast is to aid in the visualization of your intestinal tract. The contrast solution may cause some diarrhea. Depending on your individual set of symptoms, you may also receive an intravenous injection of x-ray contrast/dye. Plan on being at [facility] for 30 minutes or longer, depending on the type of exam you are having performed.  This test typically takes 30-45 minutes to complete.  If you have any questions regarding your exam or if you need to  reschedule, you may call the CT department at [phone] between the hours of 8:00 am and 5:00 pm, Monday-Friday. ___________________________________________________  Please follow up with Dr Hilarie Fredrickson in 3 months.  If you are age 57 or older, your body mass index should be between 23-30. Your Body mass index is 33.99 kg/m. If this is out of the aforementioned range listed, please consider follow up with your Primary Care Provider.  If you are age 61 or younger, your body mass index should be between 19-25. Your Body mass index is 33.99 kg/m. If this is out of the aformentioned range listed, please consider follow up with your Primary Care Provider.     ______________________________________________________________________

## 2018-01-09 NOTE — Progress Notes (Signed)
   Subjective:    Patient ID: Pamela Gonzales, female    DOB: [birth year], 84 y.o.   MRN: [id number]  HPI Pamela Gonzales is an 84 year old female with a past medical history of GERD with complex hiatal hernia with Cameron's erosions, history of gastritis, history of diverticulosis, history of colon polyp, history of mild constipation who is here for follow-up.  She was last seen on 10/06/2017.  She is here alone today.  She reports that she has been found to be anemic by her primary care and I have copies of her most recent CBC which was performed on 12/08/2017 --Hemoglobin was 10.4, MCV 82.6, platelet count 230, white count normal at 7  She reports that she was told to take nu iron earlier this year but she tolerated this very poorly.  It upset her stomach and cause loose stool.  From a GI perspective she states things are stable though she does have some left-sided abdominal comfort which she thinks is new.  She is eating 6 soft prunes per day and this does help with her bowel movements.  After bowel movement she has general lower abdominal discomfort.  She is using RectiCare or the generic equivalent for perianal irritation.  Pantoprazole is working well to control her heartburn and she has her head of bed elevated 4-1/2 inches.  No dysphagia or odynophagia.  She does report overall low energy level though she is still trying to remain very active and exercising in the pool on a regular basis.   Review of Systems As per HPI, otherwise negative  Current Medications, Allergies, Past Medical History, Past Surgical History, Family History and Social History were reviewed in [employer] record.     Objective:   Physical Exam BP 130/70   Pulse 76   Ht 5\' 4"  (1.626 m)   Wt 198 lb (89.8 kg)   BMI 33.99 kg/m  Gen: awake, alert, NAD HEENT: anicteric, op clear Abd: soft, NT/ND, +BS throughout Ext: no c/c/e Neuro: nonfocal  See HPI re: CBC      Assessment & Plan:    84 year old female with a past medical history of GERD with complex hiatal hernia with Cameron's erosions, history of gastritis, history of diverticulosis, history of colon polyp, history of mild constipation who is here for follow-up.   1.  Anemia --likely the cause of fatigue.  I am suspicious for an iron deficiency anemia.  She does have a history of hiatal hernia and so is at risk for Cameron's lesions.  I have recommended that we repeat CBC along with ferritin and iron stores today.  If iron is low given GI intolerance to oral iron I would recommend IV iron for her.  Also check CMP.  2. Abd pain --abdominal pain which for her is somewhat new.  Given her anemia and this pain I am going to perform CT scan of the abdomen pelvis with contrast.  Also checking CMP as above  3.  Mild constipation/hemorrhoids --continue dietary modification with prunes.  Over-the-counter Preparation H cream and RectiCare as needed  25 minutes spent with the patient today. Greater than 50% was spent in counseling and coordination of care with the patient

## 2018-01-13 ENCOUNTER — Telehealth: Payer: Self-pay | Admitting: Internal Medicine

## 2018-01-13 ENCOUNTER — Other Ambulatory Visit: Payer: Self-pay

## 2018-01-13 DIAGNOSIS — D509 Iron deficiency anemia, unspecified: Secondary | ICD-10-CM

## 2018-01-13 NOTE — Telephone Encounter (Signed)
Left message for pt to call back  °

## 2018-01-13 NOTE — Telephone Encounter (Signed)
Pt call in with questions about lab results.

## 2018-01-13 NOTE — Telephone Encounter (Signed)
See result note.  

## 2018-01-14 ENCOUNTER — Telehealth: Payer: Self-pay | Admitting: Internal Medicine

## 2018-01-14 NOTE — Telephone Encounter (Signed)
See result note.  

## 2018-01-19 ENCOUNTER — Other Ambulatory Visit: Payer: Medicare Other

## 2018-01-24 ENCOUNTER — Other Ambulatory Visit: Payer: Self-pay | Admitting: Cardiology

## 2018-01-26 ENCOUNTER — Ambulatory Visit (INDEPENDENT_AMBULATORY_CARE_PROVIDER_SITE_OTHER)
Admission: RE | Admit: 2018-01-26 | Discharge: 2018-01-26 | Disposition: A | Discharge status: Home / Self Care | Payer: Medicare Other | Source: Ambulatory Visit | Attending: Internal Medicine | Admitting: Internal Medicine

## 2018-01-26 DIAGNOSIS — R109 Unspecified abdominal pain: Secondary | ICD-10-CM | POA: Diagnosis not present

## 2018-01-26 DIAGNOSIS — D649 Anemia, unspecified: Secondary | ICD-10-CM

## 2018-01-26 MED ORDER — IOPAMIDOL (ISOVUE-300) INJECTION 61%
100.0000 mL | Freq: Once | INTRAVENOUS | Status: AC | PRN
  Administered 2018-01-26: 100 mL via INTRAVENOUS

## 2018-01-29 ENCOUNTER — Ambulatory Visit (HOSPITAL_COMMUNITY)
Admission: RE | Admit: 2018-01-29 | Discharge: 2018-01-29 | Disposition: A | Discharge status: Home / Self Care | Payer: Medicare Other | Source: Ambulatory Visit | Attending: Internal Medicine | Admitting: Internal Medicine

## 2018-01-29 ENCOUNTER — Telehealth: Payer: Self-pay | Admitting: Internal Medicine

## 2018-01-29 DIAGNOSIS — D509 Iron deficiency anemia, unspecified: Secondary | ICD-10-CM | POA: Diagnosis not present

## 2018-01-29 MED ORDER — SODIUM CHLORIDE 0.9 % IV SOLN
INTRAVENOUS | Status: DC | PRN
  Administered 2018-01-29: 250 mL via INTRAVENOUS

## 2018-01-29 MED ORDER — SODIUM CHLORIDE 0.9 % IV SOLN
510.0000 mg | INTRAVENOUS | Status: DC
  Administered 2018-01-29: 510 mg via INTRAVENOUS
  Filled 2018-01-29: qty 17

## 2018-01-29 NOTE — Discharge Instructions (Signed)

## 2018-01-29 NOTE — Telephone Encounter (Signed)
See result note.  

## 2018-01-29 NOTE — Progress Notes (Signed)
Patient received Feraheme  via PIV. Observed for at least 30 minutes post infusion.Tolerated well, vitals stable, discharge instructions given, verbalized understanding. Patient alert, oriented and ambulatory ( with a cane) at the time of discharge.

## 2018-01-29 NOTE — Telephone Encounter (Signed)
Pt returned your call and would like another call. °

## 2018-02-05 ENCOUNTER — Telehealth: Payer: Self-pay | Admitting: Internal Medicine

## 2018-02-05 ENCOUNTER — Ambulatory Visit (HOSPITAL_COMMUNITY)
Admission: RE | Admit: 2018-02-05 | Discharge: 2018-02-05 | Disposition: A | Discharge status: Home / Self Care | Payer: Medicare Other | Source: Ambulatory Visit | Attending: Internal Medicine | Admitting: Internal Medicine

## 2018-02-05 DIAGNOSIS — D509 Iron deficiency anemia, unspecified: Secondary | ICD-10-CM | POA: Diagnosis not present

## 2018-02-05 MED ORDER — SODIUM CHLORIDE 0.9 % IV SOLN
INTRAVENOUS | Status: DC | PRN
  Administered 2018-02-05: 250 mL via INTRAVENOUS

## 2018-02-05 MED ORDER — SODIUM CHLORIDE 0.9 % IV SOLN
510.0000 mg | Freq: Once | INTRAVENOUS | Status: AC
  Administered 2018-02-05: 510 mg via INTRAVENOUS
  Filled 2018-02-05: qty 17

## 2018-02-05 NOTE — Discharge Instructions (Signed)

## 2018-02-05 NOTE — Progress Notes (Signed)
PATIENT CARE CENTER NOTE  Diagnosis: Iron Deficiency Anemia    Provider: Dr. Zenovia Jarred   Procedure: IV Feraheme    Note: Patient received Feraheme infusion. Observed patient for 30 minutes post-infusion. Tolerated well. Vital signs stable. Discharge instructions given. Patient alert, oriented and ambulatory at discharge.

## 2018-02-05 NOTE — Telephone Encounter (Signed)
The pt was advised she would not need repeat labs for 3 months to check for effectiveness.

## 2018-02-05 NOTE — Telephone Encounter (Signed)
Pt states she just had her second infusion and is feeling well and wants to know if she needs another Blood count test to see if it work??

## 2018-02-24 ENCOUNTER — Ambulatory Visit
Admission: RE | Admit: 2018-02-24 | Discharge: 2018-02-24 | Disposition: A | Discharge status: Home / Self Care | Payer: Medicare Other | Source: Ambulatory Visit | Attending: Obstetrics and Gynecology | Admitting: Obstetrics and Gynecology

## 2018-02-24 DIAGNOSIS — Z780 Asymptomatic menopausal state: Secondary | ICD-10-CM

## 2018-02-24 DIAGNOSIS — Z1231 Encounter for screening mammogram for malignant neoplasm of breast: Secondary | ICD-10-CM

## 2018-04-10 ENCOUNTER — Ambulatory Visit: Payer: Medicare Other | Admitting: Internal Medicine

## 2018-04-10 ENCOUNTER — Other Ambulatory Visit: Payer: Self-pay

## 2018-04-10 ENCOUNTER — Encounter: Payer: Self-pay | Admitting: Internal Medicine

## 2018-04-10 VITALS — BP 128/76 | HR 70 | Temp 99.2°F | Ht 64.0 in | Wt 199.5 lb

## 2018-04-10 DIAGNOSIS — K649 Unspecified hemorrhoids: Secondary | ICD-10-CM

## 2018-04-10 DIAGNOSIS — K5900 Constipation, unspecified: Secondary | ICD-10-CM | POA: Diagnosis not present

## 2018-04-10 DIAGNOSIS — K449 Diaphragmatic hernia without obstruction or gangrene: Secondary | ICD-10-CM | POA: Diagnosis not present

## 2018-04-10 DIAGNOSIS — K219 Gastro-esophageal reflux disease without esophagitis: Secondary | ICD-10-CM

## 2018-04-10 DIAGNOSIS — D509 Iron deficiency anemia, unspecified: Secondary | ICD-10-CM

## 2018-04-10 MED ORDER — PANTOPRAZOLE SODIUM 40 MG PO TBEC: 40.0000 mg | DELAYED_RELEASE_TABLET | Freq: Every day | ORAL | 4 refills | Status: DC

## 2018-04-10 NOTE — Patient Instructions (Signed)
Please purchase the following medications over the counter and take as directed: Recticare- use as needed for rectal discomfort  Please follow up with Dr Hilarie Fredrickson in 3 months.  Continue pantoprazole.  If you are age 83 or older, your body mass index should be between 23-30. Your Body mass index is 34.24 kg/m. If this is out of the aforementioned range listed, please consider follow up with your Primary Care Provider.  If you are age 96 or younger, your body mass index should be between 19-25. Your Body mass index is 34.24 kg/m. If this is out of the aformentioned range listed, please consider follow up with your Primary Care Provider.

## 2018-04-10 NOTE — Progress Notes (Signed)
Subjective:    Patient ID: Pamela Gonzales, female    DOB: 11-18-1930, 85 y.o.   MRN: [id number]  HPI Pamela Gonzales is an 85 year old female with a history of GERD with complex hiatal hernia with Cameron's erosions, history of gastritis, history of diverticulosis, history of colon polyp, history of mild constipation here for follow-up.  She was last seen on 01/09/2018.  She is here alone today.  She reports she has been doing very well.  She feels that her symptoms from a GI perspective are stable.  She will occasionally have left-sided abdominal discomfort or lower abdominal pain.  She has continued her bowel regimen and for the most part her bowel movements have been regular.  Occasionally she will have some hemorrhoidal discomfort for which RectiCare seems to be helpful.  Her reflux seems to be well controlled and she continues the pantoprazole 40 mg daily.  No dysphagia or odynophagia.  She remains very active and exercising regularly.  She does require the use of a cane on occasion which she does not like but knows is necessary.  After her last visit she was found to have low iron anemia and received IV iron.  This helped her stamina and energy level significantly.  Also at last visit we ordered a CT scan of the abdomen pelvis for abdominal pain.  We reviewed this today.  This test was very reassuring.  Review of Systems As per HPI, otherwise negative  Current Medications, Allergies, Past Medical History, Past Surgical History, Family History and Social History were reviewed in [employer] record.     Objective:   Physical Exam BP 128/76   Pulse 70   Temp 99.2 F (37.3 C)   Ht 5\' 4"  (1.626 m)   Wt 199 lb 8 oz (90.5 kg)   BMI 34.24 kg/m  Gen: awake, alert, NAD HEENT: anicteric Neuro: nonfocal  CT ABDOMEN AND PELVIS WITH CONTRAST   TECHNIQUE: Multidetector CT imaging of the abdomen and pelvis was performed using the standard protocol following bolus  administration of intravenous contrast.   CONTRAST:  167mL ISOVUE-300 IOPAMIDOL (ISOVUE-300) INJECTION 61%   COMPARISON:  04/13/2013   FINDINGS: Lower chest: Lung bases are clear.   Hepatobiliary: Liver is within normal limits. No suspicious/enhancing hepatic lesions.   Status post cholecystectomy. No intrahepatic or extrahepatic ductal dilatation.   Pancreas: Within normal limits.   Spleen: Within normal limits.   Adrenals/Urinary Tract: Adrenal glands are within normal limits.   3.2 cm lateral left upper pole renal cyst. Right kidney is within normal limits. No hydronephrosis.   Bladder is underdistended but unremarkable.   Stomach/Bowel: Stomach is notable for a moderate hiatal hernia. No wall thickening or inflammatory changes.   No evidence of bowel obstruction.   Appendix is not discretely visualized and may be surgically absent.   Sigmoid diverticulosis, without evidence of diverticulitis.   Vascular/Lymphatic: No evidence of abdominal aortic aneurysm.   Atherosclerotic calcifications of the abdominal aorta and branch vessels.   No suspicious abdominopelvic lymphadenopathy.   Reproductive: Status post hysterectomy.   No adnexal masses.   Other: No abdominopelvic ascites.   Tiny fat containing periumbilical hernia.   Musculoskeletal: Mild degenerative changes of the lower thoracic spine. Grade 1 anterolisthesis of L5 on S1.   IMPRESSION: Moderate hiatal hernia.  No wall thickening or inflammatory changes.   Sigmoid diverticulosis, without evidence of diverticulitis.   No evidence of bowel obstruction.   Status post cholecystectomy and hysterectomy. Appendix is not  discretely visualized and may be surgically absent.     Electronically Signed   By: Julian Hy M.D.   On: 01/27/2018 09:06         Assessment & Plan:  85 year old female with a history of GERD with complex hiatal hernia with Cameron's erosions, history of gastritis,  history of diverticulosis, history of colon polyp, history of mild constipation here for follow-up.  1.  Iron deficiency anemia --status post IV iron.  Feeling better.  No overt bleeding.  Repeat blood count and iron stores at follow-up.  Etiology most likely Cameron's lesions given large hiatal hernia  2.  GERD/hiatal hernia --continue daily PPI with pantoprazole 40 mg  3.  Mild constipation/hemorrhoids --continue with dietary modification by eating prunes.  Continue over-the-counter Preparation H and RectiCare as needed  51-month follow-up, sooner if needed  25 minutes spent with the patient today. Greater than 50% was spent in counseling and coordination of care with the patient

## 2018-06-23 ENCOUNTER — Telehealth: Payer: Self-pay | Admitting: Cardiology

## 2018-06-23 DIAGNOSIS — I10 Essential (primary) hypertension: Secondary | ICD-10-CM

## 2018-06-23 DIAGNOSIS — I2510 Atherosclerotic heart disease of native coronary artery without angina pectoris: Secondary | ICD-10-CM

## 2018-06-23 DIAGNOSIS — E7800 Pure hypercholesterolemia, unspecified: Secondary | ICD-10-CM

## 2018-06-23 NOTE — Telephone Encounter (Signed)
Pt calling to see if she need to have labs drawn prior to appointment. Will route to MD and Nurse.

## 2018-06-23 NOTE — Telephone Encounter (Signed)
Returned call to patient.Advised she can have fasting cbc,lipid panel and cmet this week.Advised she does not need a appointment for lab work.Advised appointment with Dr.Jordan on 06/29/18 will need to changed to a virtual visit.Patient stated she really wants to be seen in office.Stated she is having B/P problems she needs to discuss with Dr.Jordan.Patient upset.Stated she does not like the way she is being treated.She only wants to see Dr.Jordan.Appointment 6/1 changed to a telephone visit.Patient gave permission for a virtual visit. Advised I will call her when the Oct schedule comes out for a office visit.

## 2018-06-23 NOTE — Telephone Encounter (Signed)
New message:    Patient has up coming appt and would like to know if she needs labs please leave a message if patient dose not pick up.

## 2018-06-23 NOTE — Telephone Encounter (Signed)
Yes we can do CBC, CMET and lipids prior to visit  Sybel Standish [place] MD, [facility]

## 2018-06-24 ENCOUNTER — Telehealth: Payer: Self-pay | Admitting: Cardiology

## 2018-06-24 LAB — CBC WITH DIFFERENTIAL/PLATELET
Basophils Absolute: 0.1 10*3/uL (ref 0.0–0.2)
Basos: 1 %
EOS (ABSOLUTE): 0.6 10*3/uL — ABNORMAL HIGH (ref 0.0–0.4)
Eos: 10 %
Hematocrit: 40.5 % (ref 34.0–46.6)
Hemoglobin: 13.4 g/dL (ref 11.1–15.9)
Immature Grans (Abs): 0 10*3/uL (ref 0.0–0.1)
Immature Granulocytes: 0 %
Lymphocytes Absolute: 1.2 10*3/uL (ref 0.7–3.1)
Lymphs: 21 %
MCH: 31.9 pg (ref 26.6–33.0)
MCHC: 33.1 g/dL (ref 31.5–35.7)
MCV: 96 fL (ref 79–97)
Monocytes Absolute: 0.7 10*3/uL (ref 0.1–0.9)
Monocytes: 11 %
Neutrophils Absolute: 3.4 10*3/uL (ref 1.4–7.0)
Neutrophils: 57 %
Platelets: 204 10*3/uL (ref 150–450)
RBC: 4.2 x10E6/uL (ref 3.77–5.28)
RDW: 13.1 % (ref 11.7–15.4)
WBC: 6.1 10*3/uL (ref 3.4–10.8)

## 2018-06-24 LAB — LIPID PANEL
Chol/HDL Ratio: 3 ratio (ref 0.0–4.4)
Cholesterol, Total: 165 mg/dL (ref 100–199)
HDL: 55 mg/dL (ref >39)
LDL Calculated: 84 mg/dL (ref 0–99)
Triglycerides: 128 mg/dL (ref 0–149)
VLDL Cholesterol Cal: 26 mg/dL (ref 5–40)

## 2018-06-24 LAB — COMPREHENSIVE METABOLIC PANEL
ALT: 15 IU/L (ref 0–32)
AST: 19 IU/L (ref 0–40)
Albumin/Globulin Ratio: 2 (ref 1.2–2.2)
Albumin: 4.2 g/dL (ref 3.6–4.6)
Alkaline Phosphatase: 52 IU/L (ref 39–117)
BUN/Creatinine Ratio: 15 (ref 12–28)
BUN: 12 mg/dL (ref 8–27)
Bilirubin Total: 0.7 mg/dL (ref 0.0–1.2)
CO2: 25 mmol/L (ref 20–29)
Calcium: 9.6 mg/dL (ref 8.7–10.3)
Chloride: 103 mmol/L (ref 96–106)
Creatinine, Ser: 0.8 mg/dL (ref 0.57–1.00)
GFR calc Af Amer: 78 mL/min/{1.73_m2} (ref >59)
GFR calc non Af Amer: 67 mL/min/{1.73_m2} (ref >59)
Globulin, Total: 2.1 g/dL (ref 1.5–4.5)
Glucose: 91 mg/dL (ref 65–99)
Potassium: 5 mmol/L (ref 3.5–5.2)
Sodium: 143 mmol/L (ref 134–144)
Total Protein: 6.3 g/dL (ref 6.0–8.5)

## 2018-06-24 NOTE — Telephone Encounter (Signed)
Smart phone/phone visit/call home #/pre reg complete/consent obtained --ttf

## 2018-06-26 NOTE — Progress Notes (Signed)
Virtual Visit via Telephone Note   This visit type was conducted due to national recommendations for restrictions regarding the COVID-19 Pandemic (e.g. social distancing) in an effort to limit this patient's exposure and mitigate transmission in our community.  Due to her co-morbid illnesses, this patient is at least at moderate risk for complications without adequate follow up.  This format is felt to be most appropriate for this patient at this time.  The patient did not have access to video technology/had technical difficulties with video requiring transitioning to audio format only (telephone).  All issues noted in this document were discussed and addressed.  No physical exam could be performed with this format.  Please refer to the patient's chart for her  consent to telehealth for [facility].   Date:  06/29/2018   ID:  [name], DOB May 12, 1930, MRN [id number]  Patient Location: Home Provider Location: Home  PCP:  Lawerance Cruel, MD  Cardiologist:  Peter Martinique MD Electrophysiologist:  None   Evaluation Performed:  Follow-Up Visit  Chief Complaint:  Follow up CAD  History of Present Illness:    Pamela Gonzales is a 85 y.o. female with a history of HLD, Lupus, GERD, OA, RBBB and CAD with past stents placed in 2005. She had stenting of the mid LAD and 2nd OM in 2005 with Cypher stents. Last Myoview was in June 2017 and was normal. She has remained on Plavix.   She notes that recently her BP has been running higher. Over the past week BP typically 140/85. Had it checked in the her clinic today and it was 184/80. She checked on her cuff several times and still running high 154/90. She feels well. Not exercising as much since gym and pool at [place] are closed. Eating more sweets.   The patient does not have symptoms concerning for COVID-19 infection (fever, chills, cough, or new shortness of breath).    Past Medical History:  Diagnosis Date  . Arthritis   . Asthmatic  bronchitis   . Benign fundic gland polyps of stomach   . CAD (coronary artery disease) 05   stents  . Diverticulosis   . Esophageal dysmotility   . Fibula fracture    right  . Gallstones   . Gastric erosions   . GERD (gastroesophageal reflux disease)   . Hiatal hernia   . Hypercholesteremia   . Internal hemorrhoids   . Lupus ([place])   . Osteoarthritis   . Osteoporosis   . Peripheral neuropathy   . RBBB   . Spinal stenosis   . Tubular adenoma of colon    Past Surgical History:  Procedure Laterality Date  . ABDOMINAL HYSTERECTOMY  1975  . BREAST EXCISIONAL BIOPSY Right   . CATARACT EXTRACTION, BILATERAL    . CHOLECYSTECTOMY  07/04/2011   Procedure: LAPAROSCOPIC CHOLECYSTECTOMY WITH INTRAOPERATIVE CHOLANGIOGRAM;  Surgeon: Merrie Roof, MD;  Location: [place] [facility];  Service: General;  Laterality: N/A;  . COLONOSCOPY W/ BIOPSIES    . CORONARY ANGIOPLASTY  2005  . ESOPHAGOGASTRODUODENOSCOPY (EGD) WITH PROPOFOL N/A 06/11/2016   Procedure: ESOPHAGOGASTRODUODENOSCOPY (EGD) WITH PROPOFOL;  Surgeon: Garlan Fair, MD;  Location: [facility];  Service: Endoscopy;  Laterality: N/A;  . FIBULA FRACTURE SURGERY  2011   Right Ankle  . FLEXIBLE SIGMOIDOSCOPY N/A 07/09/2016   Procedure: FLEXIBLE SIGMOIDOSCOPY;  Surgeon: Garlan Fair, MD;  Location: [facility];  Service: Endoscopy;  Laterality: N/A;     Current Meds  Medication Sig  .  aspirin 81 MG tablet Take 81 mg by mouth daily.    . Calcium Citrate-Vitamin D (CALCIUM + D PO) Take 1 tablet by mouth 2 (two) times daily.  . clopidogrel (PLAVIX) 75 MG tablet TAKE 1 TABLET BY MOUTH EVERY DAY  . dextromethorphan-guaiFENesin (MUCINEX DM) 30-600 MG 12hr tablet Take 2 tablets by mouth daily.  . folic acid (FOLVITE) 1 MG tablet TAKE 1/2 TABLET BY MOUTH EVERY DAY  . Lidocaine, Anorectal, (RECTICARE) 5 % CREA Apply topically as needed. Applies after q BM  . Multiple Vitamins-Minerals (MULTIVITAMIN WITH MINERALS) tablet Take 1 tablet by mouth  daily.  Marland Kitchen MYRBETRIQ 50 MG TB24 tablet Take 1 tablet by mouth daily.  Marland Kitchen NASONEX 50 MCG/ACT nasal spray 2 sprays by Nasal route Daily.  . nitroGLYCERIN (NITROSTAT) 0.4 MG SL tablet Place 1 tablet (0.4 mg total) under the tongue every 5 (five) minutes as needed for chest pain.  . pantoprazole (PROTONIX) 40 MG tablet Take 1 tablet (40 mg total) by mouth daily.  . rosuvastatin (CRESTOR) 40 MG tablet TAKE 1 TABLET BY MOUTH EVERY DAY  . [DISCONTINUED] losartan (COZAAR) 25 MG tablet Take 25 mg by mouth daily.     Allergies:   Cephalexin; Lisinopril; Oxycodone; Penicillins; Sulfa drugs cross reactors; and Tramadol   Social History   Tobacco Use  . Smoking status: Never Smoker  . Smokeless tobacco: Never Used  Substance Use Topics  . Alcohol use: No  . Drug use: No     Family Hx: The patient's family history includes Cancer in her sister; Heart attack in her brother and mother; Heart disease in her brother and sister; Lung cancer in her father; Pancreatic cancer in her sister; Rectal cancer in her brother. There is no history of Anesthesia problems, Hypotension, Malignant hyperthermia, Pseudochol deficiency, Breast cancer, Colon cancer, or Stomach cancer.  ROS:   Please see the history of present illness.    All other systems reviewed and are negative.   Prior CV studies:   The following studies were reviewed today:  none  Labs/Other Tests and Data Reviewed:    EKG:  No ECG reviewed.  Recent Labs: 06/24/2018: ALT 15; BUN 12; Creatinine, Ser 0.80; Hemoglobin 13.4; Platelets 204; Potassium 5.0; Sodium 143   Recent Lipid Panel Lab Results  Component Value Date/Time   CHOL 165 06/24/2018 08:27 AM   TRIG 128 06/24/2018 08:27 AM   HDL 55 06/24/2018 08:27 AM   CHOLHDL 3.0 06/24/2018 08:27 AM   CHOLHDL 2.9 07/08/2016 08:38 AM   LDLCALC 84 06/24/2018 08:27 AM    Wt Readings from Last 3 Encounters:  06/29/18 198 lb (89.8 kg)  04/10/18 199 lb 8 oz (90.5 kg)  01/09/18 198 lb (89.8 kg)      Objective:    Vital Signs:  BP (!) 184/80   Pulse 85   Ht 5\' 4"  (1.626 m)   Wt 198 lb (89.8 kg)   BMI 33.99 kg/m    VITAL SIGNS:  reviewed  ASSESSMENT & PLAN:    1. CAD - s/p stenting of the LAD and OM in 2005 with first generation Cypher stents. Normal Myoview in June 2017.  She is asymptomatic. Since she has first generation DES we have continued  DAPT indefinitely. If she should have evidence of bleeding  I would favor stopping Plavix.  2. HTN - BP is high. Will increase losartan to 50 mg daily and monitor. She is to call if readings are still high after 2-3 weeks.   3.  HLD - on statin. LDL 84. Goal 70. On maximal dose of Crestor. Would like to avoid additional medical therapy.  4. Chronic RBBB since 2014.  COVID-19 Education: The signs and symptoms of COVID-19 were discussed with the patient and how to seek care for testing (follow up with PCP or arrange E-visit).  The importance of social distancing was discussed today.  Time:   Today, I have spent 15 minutes with the patient with telehealth technology discussing the above problems.     Medication Adjustments/Labs and Tests Ordered: Current medicines are reviewed at length with the patient today.  Concerns regarding medicines are outlined above.   Tests Ordered: No orders of the defined types were placed in this encounter.   Medication Changes: Meds ordered this encounter  Medications  . losartan (COZAAR) 50 MG tablet    Sig: Take 1 tablet (50 mg total) by mouth daily.    Dispense:  90 tablet    Refill:  3    Disposition:  Follow up in 6 month(s)  Signed, Peter Martinique, MD  06/29/2018 12:52 PM    [facility]

## 2018-06-29 ENCOUNTER — Encounter: Payer: Self-pay | Admitting: Cardiology

## 2018-06-29 ENCOUNTER — Telehealth (INDEPENDENT_AMBULATORY_CARE_PROVIDER_SITE_OTHER): Payer: Medicare Other | Admitting: Cardiology

## 2018-06-29 ENCOUNTER — Ambulatory Visit: Payer: Medicare Other | Admitting: Cardiology

## 2018-06-29 VITALS — BP 184/80 | HR 85 | Ht 64.0 in | Wt 198.0 lb

## 2018-06-29 DIAGNOSIS — I4510 Unspecified right bundle-branch block: Secondary | ICD-10-CM

## 2018-06-29 DIAGNOSIS — I10 Essential (primary) hypertension: Secondary | ICD-10-CM

## 2018-06-29 DIAGNOSIS — I2510 Atherosclerotic heart disease of native coronary artery without angina pectoris: Secondary | ICD-10-CM | POA: Diagnosis not present

## 2018-06-29 DIAGNOSIS — E7800 Pure hypercholesterolemia, unspecified: Secondary | ICD-10-CM

## 2018-06-29 MED ORDER — LOSARTAN POTASSIUM 50 MG PO TABS: 50.0000 mg | ORAL_TABLET | Freq: Every day | ORAL | 3 refills | Status: DC

## 2018-06-29 NOTE — Patient Instructions (Addendum)
Increase losartan to 50 mg daily  Monitor BP daily and let me know if it is still high after 2-3 weeks  Follow up 6 months   Call 3 months before to schedule

## 2018-07-07 ENCOUNTER — Telehealth: Payer: Self-pay | Admitting: Cardiology

## 2018-07-07 MED ORDER — LOSARTAN POTASSIUM 100 MG PO TABS: 100.0000 mg | ORAL_TABLET | Freq: Every day | ORAL | 6 refills | Status: DC

## 2018-07-07 NOTE — Telephone Encounter (Signed)
Returned call to patient who wants to speak with Pamela Gonzales about her BP - notified patient I can take her info and send to nurse & doctor.   She has been monitoring BP since 6/2 and wants to know if she needs to make med changes. BP is higher in the AM - she checks around 0530 and takes losartan 50mg  around 0730. She is concerned about her BP fluctuation.   6/2 - 161/115 Various readings: 154/74, 150/94, 151/95 6/8 - 161/101 6/9 - 150/92  She reports her BP at PT around 9am is 138/75 and after swimming is 136/80.   Her HR is 75-80bpm  Explained that BP will be higher PRIOR to taking her medications and that it is expected to decrease after taking her meds. She would like MD to review readings and tell her if she should take losartan 50mg  all at once in the AM or split the dose to 25mg  BID

## 2018-07-07 NOTE — Telephone Encounter (Signed)
Called patient back, she states she just spoke with Pamela Gonzales and got clarification.  No other questions at this time.

## 2018-07-07 NOTE — Telephone Encounter (Signed)
Patient just needs clarification of her taking 100mg  of losartan at night.

## 2018-07-07 NOTE — Telephone Encounter (Signed)
Spoke to patient Dr.Jordan's advice given.She will continue to monitor B/P and call back if remains elevated.

## 2018-07-07 NOTE — Telephone Encounter (Signed)
Patient would like to speak to nurse. Would not give further information.

## 2018-07-07 NOTE — Telephone Encounter (Signed)
I think she needs to increase losartan to 100 mg daily. She may want to take it in the evening to help blunt the morning high readings.  Peter Martinique MD, [facility]

## 2018-07-10 ENCOUNTER — Other Ambulatory Visit: Payer: Self-pay

## 2018-07-14 ENCOUNTER — Other Ambulatory Visit: Payer: Self-pay

## 2018-07-14 NOTE — Telephone Encounter (Signed)
Refills

## 2018-07-15 ENCOUNTER — Other Ambulatory Visit: Payer: Self-pay

## 2018-07-15 ENCOUNTER — Ambulatory Visit (INDEPENDENT_AMBULATORY_CARE_PROVIDER_SITE_OTHER): Payer: Medicare Other | Admitting: Internal Medicine

## 2018-07-15 ENCOUNTER — Encounter: Payer: Self-pay | Admitting: Internal Medicine

## 2018-07-15 ENCOUNTER — Telehealth: Payer: Self-pay | Admitting: Internal Medicine

## 2018-07-15 VITALS — Ht 64.0 in | Wt 194.0 lb

## 2018-07-15 DIAGNOSIS — D509 Iron deficiency anemia, unspecified: Secondary | ICD-10-CM

## 2018-07-15 DIAGNOSIS — K5900 Constipation, unspecified: Secondary | ICD-10-CM | POA: Diagnosis not present

## 2018-07-15 DIAGNOSIS — K449 Diaphragmatic hernia without obstruction or gangrene: Secondary | ICD-10-CM

## 2018-07-15 DIAGNOSIS — K219 Gastro-esophageal reflux disease without esophagitis: Secondary | ICD-10-CM | POA: Diagnosis not present

## 2018-07-15 DIAGNOSIS — K649 Unspecified hemorrhoids: Secondary | ICD-10-CM

## 2018-07-15 NOTE — Telephone Encounter (Signed)
I have spoken with patient and we have gone over her prescreen.

## 2018-07-15 NOTE — Progress Notes (Signed)
Subjective:   This service was provided via telemedicine.  Telephone encounter The patient was located at home The provider was located in provider's GI office. The patient did consent to this telephone visit and is aware of possible charges through their insurance for this visit.   The persons participating in this telemedicine service were the patient and I. Time spent on call: 11 min    Patient ID: Pamela Gonzales, female    DOB: [birth year], 85 y.o.   MRN: [id number]  HPI Pamela Gonzales is an 85 yo female with history of GERD with complex hiatal hernia with Cameron's erosions, history of iron deficiency anemia felt secondary to hiatal hernia, history of gastritis, history of diverticulosis, history of remote colon polyp, history of constipation here for follow-up.  She is seen by telephone encounter today in the setting of COVID-19 and was last seen on 04/10/2018.  She reports she is doing well.  She is eating an apple a day rather than prunes which helps to regulate her bowel movement.  She has bowel movements daily which are easy and formed.  Occasionally she will have a loose stool but this is infrequent.  No blood in her stool or melena.  She uses RectiCare on occasion for hemorrhoidal discomfort.  She is remaining very active and swimming and performing water aerobics 3 days a week.  She has increased her losartan recently with Dr. Martinique to better control her blood pressure.  Her hemoglobin also has improved and she was able to tell immediate improvement after getting IV iron in December.   Review of Systems As per HPI, otherwise negative  Current Medications, Allergies, Past Medical History, Past Surgical History, Family History and Social History were reviewed in [employer] record.     Objective:   Physical Exam Virtual visit CBC    Component Value Date/Time   WBC 6.1 06/24/2018 0827   WBC 8.5 01/09/2018 1525   RBC 4.20 06/24/2018 0827   RBC 3.99  01/09/2018 1525   HGB 13.4 06/24/2018 0827   HCT 40.5 06/24/2018 0827   PLT 204 06/24/2018 0827   MCV 96 06/24/2018 0827   MCH 31.9 06/24/2018 0827   MCH 29.0 07/08/2016 0838   MCHC 33.1 06/24/2018 0827   MCHC 31.8 01/09/2018 1525   RDW 13.1 06/24/2018 0827   LYMPHSABS 1.2 06/24/2018 0827   MONOABS 1.0 01/09/2018 1525   EOSABS 0.6 (H) 06/24/2018 0827   BASOSABS 0.1 06/24/2018 0827         Assessment & Plan:  85 yo female with history of GERD with complex hiatal hernia with Cameron's erosions, history of iron deficiency anemia felt secondary to hiatal hernia, history of gastritis, history of diverticulosis, history of remote colon polyp, history of constipation here for follow-up.  1.  Iron deficiency anemia --improved and now hemoglobin is normal at 13.4.  MCV is up to 96.  Iron deficiency felt secondary to Cameron's lesions with large hiatal hernia.  No overt bleeding.  Feeling very well.  I recommend that we recheck her hemoglobin and iron stores in 3 months at follow-up.  2.  GERD/hiatal hernia --continue pantoprazole 40 mg daily with good control of heartburn and reflux symptoms.  3.  Mild constipation/dyssynergic defecation/hemorrhoids --well treated with dietary measures, currently eating an apple per day.  Can use over-the-counter Preparation H and RectiCare per box instruction as needed.  4.  Hypertension --recently increase losartan to 100 mg daily per Dr. Martinique  Per patient request,  follow-up in late September 2020

## 2018-07-15 NOTE — Patient Instructions (Signed)
Continue pantoprazole 40 mg daily.  You may use over-the-counter Preparation H and RectiCare per box instructions as needed.  We will contact you for follow-up in late September 2020 once we get a schedule opening.  If you are age 82 or older, your body mass index should be between 23-30. Your Body mass index is 33.3 kg/m. If this is out of the aforementioned range listed, please consider follow up with your Primary Care Provider.  If you are age 86 or younger, your body mass index should be between 19-25. Your Body mass index is 33.3 kg/m. If this is out of the aformentioned range listed, please consider follow up with your Primary Care Provider.

## 2018-07-15 NOTE — Telephone Encounter (Signed)
Patient return call. ?

## 2018-08-06 ENCOUNTER — Telehealth: Payer: Self-pay | Admitting: Cardiology

## 2018-08-06 NOTE — Telephone Encounter (Signed)
LVM for patient to schedule followup in December with Dr. Martinique.

## 2018-08-12 ENCOUNTER — Telehealth: Payer: Self-pay | Admitting: Cardiology

## 2018-08-12 MED ORDER — AMLODIPINE BESYLATE 2.5 MG PO TABS: 2.5000 mg | ORAL_TABLET | Freq: Every day | ORAL | 6 refills | Status: DC

## 2018-08-12 NOTE — Telephone Encounter (Signed)
Called patient no answer.Left message on personal voice mail to fax B/P readings to fax # [phone].

## 2018-08-12 NOTE — Telephone Encounter (Signed)
Called patient left message on personal voice mail Dr.Jordan reviewed B/P readings.Advised to call me back.

## 2018-08-12 NOTE — Telephone Encounter (Signed)
Spoke to patient Dr.Jordan reviewed B/P readings you faxed.He prescribed Amlodipine 2.5 mg daily.Prescription sent to pharmacy.Continue all other medications.Advised to continue to monitor B/P and call back if continues to be elevated.

## 2018-08-12 NOTE — Telephone Encounter (Signed)
New message   Patient states that she is sending a fax with blood pressure readings. Please call if have any questions. Patient states that dr. Harrington Challenger is concerned about blood pressure.

## 2018-08-13 ENCOUNTER — Telehealth: Payer: Self-pay | Admitting: Cardiology

## 2018-08-13 NOTE — Telephone Encounter (Signed)
  Patient was started on amLODipine (NORVASC) 2.5 MG tablet and she wants to know if she takes that and her losartan at the same time. And she also wants to know if she should take them in the morning or at night.

## 2018-08-13 NOTE — Telephone Encounter (Signed)
Spoke to patient advised ok to take Amlodipine and Losartan in the am after breakfast.Stated she will keep a log of B/P's for the next 2 weeks and fax to office for Dr.Jordan to review.

## 2018-09-10 ENCOUNTER — Telehealth: Payer: Self-pay | Admitting: Cardiology

## 2018-09-10 NOTE — Telephone Encounter (Signed)
New message:    Patient calling to let you know she will be faxing over BP. Just wanted you to be aware.

## 2018-09-11 NOTE — Telephone Encounter (Signed)
Spoke to patient B/P readings: 146/90,163/93,158/84,137/76,155/92,157/90,144/81,146/79,144/84,157/83,132/80,145/82,130/76,155/99,153/89,154/80,153/83,146/87,140/81,149/87,154/81,153/91/91,147/84,162/81,154/80,140/90,151/81,116/68,153/76,151/79,136/74,130/70.Pulse 71-90.Stated Amlodipine 2.5 mg was added 08/13/18.Advised I will send B/P readings to Dr.Jordan for advice.

## 2018-09-11 NOTE — Telephone Encounter (Signed)
Spoke to patient Dr.Jordan advised to increase Amlodipine to 5 mg daily.Advised to continue to monitor B/P and call back if continues to be elevated.

## 2018-09-11 NOTE — Telephone Encounter (Signed)
Increase amlodipine to 5 mg daily  Sindia Kowalczyk Martinique MD, [facility]

## 2018-10-06 ENCOUNTER — Other Ambulatory Visit: Payer: Self-pay | Admitting: Cardiology

## 2018-10-06 MED ORDER — AMLODIPINE BESYLATE 5 MG PO TABS: 5.0000 mg | ORAL_TABLET | Freq: Every day | ORAL | 3 refills | Status: DC

## 2018-10-06 NOTE — Telephone Encounter (Signed)
Rx(s) sent to pharmacy electronically.  

## 2018-10-06 NOTE — Telephone Encounter (Signed)
New message   Patient states that the needs a prescription for this medication:  amLODipine (NORVASC) 5 MG tablet   And it needs to be sent to [id number] - [place], [place] [address]

## 2018-10-07 ENCOUNTER — Telehealth: Payer: Self-pay | Admitting: Cardiology

## 2018-10-07 NOTE — Telephone Encounter (Signed)
Returned call to patient left Dr.Jordan's recommendation on personal voice mail.Advised to call back in 3 weeks to report symptoms.

## 2018-10-07 NOTE — Telephone Encounter (Signed)
She may try and come off Crestor for 3 weeks and see if symptoms improve. If they go away it would be worth trying again at a lower dose.  Tifanie Gardiner Martinique MD, [facility]

## 2018-10-07 NOTE — Telephone Encounter (Signed)
New message   Patient would like to talk to Dr. Martinique about the possibly of coming off of: rosuvastatin (CRESTOR) 40 MG tablet. Patient states that she is having cramps in legs and muscles. Please advise.

## 2018-10-12 ENCOUNTER — Telehealth: Payer: Self-pay | Admitting: Cardiology

## 2018-10-12 ENCOUNTER — Other Ambulatory Visit: Payer: Self-pay

## 2018-10-12 MED ORDER — ROSUVASTATIN CALCIUM 40 MG PO TABS: 40.0000 mg | ORAL_TABLET | Freq: Every day | ORAL | 3 refills | Status: DC

## 2018-10-12 NOTE — Telephone Encounter (Signed)
Received B/P readings I will show Dr.Jordan tomorrow when he is back in office.

## 2018-10-12 NOTE — Telephone Encounter (Signed)
Ms. Sansing wanted [name] to be on the lookout for a 2 pg fax that she just sent to the office. This fax includes her BP readings from the past several weeks. The readings were taken with a wrist monitor at home. The patient is aware that the wrist monitor may not be as accurate, but it is what she had at home.

## 2018-10-13 ENCOUNTER — Encounter: Payer: Self-pay | Admitting: Neurology

## 2018-10-13 NOTE — Telephone Encounter (Signed)
Spoke to patient Dr.Jordan reviewed B/P readings.He advised to buy a new arm cuff monitor.Wrist monitor not accurate.Advised to call back with B/P reading in 2 weeks after using new arm cuff.He also reviewed letter you mailed.He advised to hold Crestor for 3 weeks and call and report symptoms.

## 2018-10-15 NOTE — Progress Notes (Signed)
Follow-up Visit   Date: 10/16/18    Pamela Gonzales MRN: [id number] DOB: [birth year]   Interim History: Pamela Gonzales is a 85 y.o. right-handed -handed Caucasian female with hyperlipidemia, hypertension, CAD, GERD, OA, SLE, osteoporosis returning to the clinic for follow-up of peripheral neuropathy and new complaints of neck pain.  The patient was accompanied to the clinic by self.  She lives at [facility].    History of present illness: Starting in 2016, she began having "thickening sensation" over the toes and entire foot as well as imbalance and was stumbling more frequently.  She suffered two falls in the past few years and bruised her ribs on the left.  With prolonged standing, she has weakness in the left foot and feels lack of control.  Symptoms are intermittent and always triggered by standing and walking. There is no pain or tingling in the left foot or low back pain.  She has instability with gait and walks unassisted, but relies heavily on grab bars in the hallway. Occasionally, she has spasm in the back, no leg cramps.  She does not have similar problems in the right leg.    She stays very active and goes to the gym three times per week.   She has no personal history of diabetes, family history of neuropathy, no heavy alcohol consumption.  She saw her PCP for these symptoms and had vitmain B12 and TSH levels checked which were normal. She was evaluated by Dr. Sherwood Gambler initially for these symptoms who ordered MRI lumbar spine which showed degenerative changes with severe lumbar canal stenosis at L4-5 and severe biforaminal stenosis at L5-S1.  UPDATE 09/01/2017:  She is here today to review the results of her NCS/EMG which confirms the presence of a sensorimotor polyneuropathy as well as bilateral S1 radiculopathy.  She continues to be very active and engages in exercise almost daily.  She will be getting a cane tomorrow, but is very reluctant to want to use it.  Her numbness of the toes is slightly better than when she was here last.  No interval falls or hospitalizations.  UPDATE 10/15/2018:  She is here for follow-up of neuropathy and new complaints of neck pain.  In May, she was doing physical therapy for piriformis syndrome and developed severe neck pain over the left side. Pain was described as sharp and radiate from her neck to the left shoulder blade.  She did not have any radiation of pain into her arm.  There was no associated weakness, burning, numbness, or tingling in the left arm.  Takes tylenol every 6 hours and neck cushion/traction which helped.    She does not have any progressive symptoms of neuropathy and uses a cane only when walking on uneven ground.  She has some imbalance.   She stays very active and exercises regularly.  She also has a lot of joint related issues - complains of left IT band tightness, right knee meniscus pain, stiffness of the legs.   Medications:  Current Outpatient Medications on File Prior to Visit  Medication Sig Dispense Refill  . amLODipine (NORVASC) 5 MG tablet Take 1 tablet (5 mg total) by mouth daily. 90 tablet 3  . aspirin 81 MG tablet Take 81 mg by mouth daily.      . Calcium Citrate-Vitamin D (CALCIUM + D PO) Take 1 tablet by mouth 2 (two) times daily.    . clopidogrel (PLAVIX) 75 MG tablet TAKE 1 TABLET BY MOUTH EVERY DAY  90 tablet 3  . dextromethorphan-guaiFENesin (MUCINEX DM) 30-600 MG 12hr tablet Take 2 tablets by mouth daily.    . folic acid (FOLVITE) 1 MG tablet TAKE 1/2 TABLET BY MOUTH EVERY DAY 45 tablet 6  . Lidocaine, Anorectal, (RECTICARE) 5 % CREA Apply topically as needed. Applies after q BM    . loratadine (CLARITIN) 10 MG tablet Take 10 mg by mouth daily.    . Multiple Vitamins-Minerals (MULTIVITAMIN WITH MINERALS) tablet Take 1 tablet by mouth daily.    Marland Kitchen MYRBETRIQ 50 MG TB24 tablet Take 1 tablet by mouth daily.  2  . NASONEX 50 MCG/ACT nasal spray 2 sprays by Nasal route Daily.    .  nitroGLYCERIN (NITROSTAT) 0.4 MG SL tablet Place 1 tablet (0.4 mg total) under the tongue every 5 (five) minutes as needed for chest pain. 25 tablet 11  . pantoprazole (PROTONIX) 40 MG tablet Take 1 tablet (40 mg total) by mouth daily. 30 tablet 4  . rosuvastatin (CRESTOR) 40 MG tablet Take 1 tablet (40 mg total) by mouth daily. 90 tablet 3  . losartan (COZAAR) 100 MG tablet Take 1 tablet (100 mg total) by mouth daily. 30 tablet 6   No current facility-administered medications on file prior to visit.     Allergies:  Allergies  Allergen Reactions  . Cephalexin   . Lisinopril Cough  . Oxycodone     itching  . Oxycodone-Acetaminophen   . Penicillins   . Sulfa Drugs Cross Reactors Other (See Comments)    Skin irritation.  . Tramadol     Review of Systems:  CONSTITUTIONAL: No fevers, chills, night sweats, or weight loss.  EYES: No visual changes or eye pain ENT: No hearing changes.  No history of nose bleeds.   RESPIRATORY: No cough, wheezing and shortness of breath.   CARDIOVASCULAR: Negative for chest pain, and palpitations.   GI: Negative for abdominal discomfort, blood in stools or black stools.  No recent change in bowel habits.   GU:  No history of incontinence.   MUSCLOSKELETAL: No history of joint pain or swelling.  No myalgias.   SKIN: Negative for lesions, rash, and itching.   ENDOCRINE: Negative for cold or heat intolerance, polydipsia or goiter.   PSYCH:  No depression or anxiety symptoms.   NEURO: As Above.   Vital Signs:  BP 140/80   Pulse 86   Ht 5\' 4"  (1.626 m)   Wt 198 lb (89.8 kg)   SpO2 98%   BMI 33.99 kg/m   Neurological Exam: MENTAL STATUS including orientation to time, place, person, recent and remote memory, attention span and concentration, language, and fund of knowledge is normal.  Speech is not dysarthric.  CRANIAL NERVES:   Pupils equal round and reactive to light.  Normal conjugate, extra-ocular eye movements in all directions of gaze.  No  ptosis. Normal facial sensation.  Face is symmetric. Palate elevates symmetrically.  Tongue is midline.  MOTOR:  Motor strength is 5/5 in all extremities, including distal toe flexion and exntesion.  No atrophy, fasciculations or abnormal movements.  No pronator drift.  Tone is normal.    MSRs:  Reflexes are 2+/4 throughout, except absent Achilles bilaterally.  SENSORY:  Very mild loss of vibration at the great toe bilaterally, temperature and pin prick reduced over the dorsum of the feet.    COORDINATION/GAIT:  Normal finger-to- nose-finger. Gait wide-based and stable, unassisted   Data: NCS/EMG of the leg 08/26/2017: 1. The electrophysiologic findings are most consistent with  a chronic and symmetric sensorimotor axonal polyneuropathy affecting the lower extremities. 2. There is a superimposed chronic S1 radiculopathy affecting bilateral lower extremities, mild in degree electrically.  MRI lumbar spine 01/2017:  Degenerative changes at L1-2 and L2-3 levels, mild disc bulbing and canal stenosis at L3-4 level.  Significant degeneration at L4-5 and L5-S1, progressed from 2012.  At L4-5 level, there is severe hypertrophic facet arthropathy, ligamentum flavum thickening, circumfrential disc bulbing, and marked to severe multifactorial canal stenosis.  At L5-S1, there is circumfrential disc bulbing, hypertrophic facet atrhropathy, ligamentum flavum thickening, and mild canal but marked to severe lateral recess stenosis bilaterally at L5-S1 level. Labs 04/28/2017:  TSH 1.89, vitamin B12 1140  IMPRESSION/PLAN: 1. Idiopathic peripheral neuropathy manifesting with numbness of the toes, stable.  No signs of progression.  - Continue exercise daily  - Patient educated on daily foot inspection, fall prevention, and safety precautions around the home.  2. Cervicalgia, resolved.  The pain she experienced in May did not have radicular quality and is more suggestive of musculoskeletal pain.  It improved with home  exercises and tylenol.  If she develops radicular symptoms or worsening pain, recommend neck physiotherapy and/or MRI cervical spine.    She had many questions which were answered to the best of my ability.  Return to clinic in 1 year  Greater than 50% of this 25 minute visit was spent in counseling, explanation of diagnosis, planning of further management, and coordination of care.   Thank you for allowing me to participate in patient's care.  If I can answer any additional questions, I would be pleased to do so.    Sincerely,    Tatia Petrucci K. Posey Pronto, DO

## 2018-10-16 ENCOUNTER — Other Ambulatory Visit: Payer: Self-pay

## 2018-10-16 ENCOUNTER — Ambulatory Visit (INDEPENDENT_AMBULATORY_CARE_PROVIDER_SITE_OTHER): Payer: Medicare Other | Admitting: Neurology

## 2018-10-16 ENCOUNTER — Encounter: Payer: Self-pay | Admitting: Neurology

## 2018-10-16 VITALS — BP 140/80 | HR 86 | Ht 64.0 in | Wt 198.0 lb

## 2018-10-16 DIAGNOSIS — M542 Cervicalgia: Secondary | ICD-10-CM

## 2018-10-16 DIAGNOSIS — G629 Polyneuropathy, unspecified: Secondary | ICD-10-CM | POA: Diagnosis not present

## 2018-10-16 NOTE — Patient Instructions (Signed)
Return to clinic in 1 year.

## 2018-10-19 ENCOUNTER — Other Ambulatory Visit: Payer: Self-pay | Admitting: Internal Medicine

## 2018-10-20 ENCOUNTER — Other Ambulatory Visit (INDEPENDENT_AMBULATORY_CARE_PROVIDER_SITE_OTHER): Payer: Medicare Other

## 2018-10-20 ENCOUNTER — Ambulatory Visit (INDEPENDENT_AMBULATORY_CARE_PROVIDER_SITE_OTHER): Payer: Medicare Other | Admitting: Internal Medicine

## 2018-10-20 VITALS — BP 142/88 | HR 78 | Wt 201.0 lb

## 2018-10-20 DIAGNOSIS — D500 Iron deficiency anemia secondary to blood loss (chronic): Secondary | ICD-10-CM | POA: Diagnosis not present

## 2018-10-20 DIAGNOSIS — K5900 Constipation, unspecified: Secondary | ICD-10-CM

## 2018-10-20 DIAGNOSIS — D509 Iron deficiency anemia, unspecified: Secondary | ICD-10-CM | POA: Diagnosis not present

## 2018-10-20 DIAGNOSIS — K219 Gastro-esophageal reflux disease without esophagitis: Secondary | ICD-10-CM | POA: Diagnosis not present

## 2018-10-20 DIAGNOSIS — K449 Diaphragmatic hernia without obstruction or gangrene: Secondary | ICD-10-CM | POA: Diagnosis not present

## 2018-10-20 DIAGNOSIS — K644 Residual hemorrhoidal skin tags: Secondary | ICD-10-CM

## 2018-10-20 LAB — CBC WITH DIFFERENTIAL/PLATELET
Basophils Absolute: 0.1 10*3/uL (ref 0.0–0.1)
Basophils Relative: 1 % (ref 0.0–3.0)
Eosinophils Absolute: 0.3 10*3/uL (ref 0.0–0.7)
Eosinophils Relative: 4.4 % (ref 0.0–5.0)
HCT: 40.5 % (ref 36.0–46.0)
Hemoglobin: 13.5 g/dL (ref 12.0–15.0)
Lymphocytes Relative: 23 % (ref 12.0–46.0)
Lymphs Abs: 1.6 10*3/uL (ref 0.7–4.0)
MCHC: 33.4 g/dL (ref 30.0–36.0)
MCV: 94.8 fl (ref 78.0–100.0)
Monocytes Absolute: 0.9 10*3/uL (ref 0.1–1.0)
Monocytes Relative: 12.8 % — ABNORMAL HIGH (ref 3.0–12.0)
Neutro Abs: 4.1 10*3/uL (ref 1.4–7.7)
Neutrophils Relative %: 58.8 % (ref 43.0–77.0)
Platelets: 201 10*3/uL (ref 150.0–400.0)
RBC: 4.27 Mil/uL (ref 3.87–5.11)
RDW: 13.3 % (ref 11.5–15.5)
WBC: 6.9 10*3/uL (ref 4.0–10.5)

## 2018-10-20 LAB — IBC + FERRITIN
Ferritin: 14.3 ng/mL (ref 10.0–291.0)
Iron: 91 ug/dL (ref 42–145)
Saturation Ratios: 26 % (ref 20.0–50.0)
Transferrin: 250 mg/dL (ref 212.0–360.0)

## 2018-10-20 NOTE — Patient Instructions (Signed)
Your provider has requested that you go to the basement level for lab work before leaving today. Press "B" on the elevator. The lab is located at the first door on the left as you exit the elevator.  Please follow up with Dr Hilarie Fredrickson in 3 months.  Continue current medications.  If you are age 83 or older, your body mass index should be between 23-30. Your Body mass index is 34.5 kg/m. If this is out of the aforementioned range listed, please consider follow up with your Primary Care Provider.  If you are age 5 or younger, your body mass index should be between 19-25. Your Body mass index is 34.5 kg/m. If this is out of the aformentioned range listed, please consider follow up with your Primary Care Provider.

## 2018-10-21 NOTE — Progress Notes (Signed)
Subjective:    Patient ID: Pamela Gonzales, female    DOB: 09/15/1930, 86 y.o.   MRN: [id number]  HPI Pamela Gonzales is an 85 year old female with a past medical history of GERD with complex hiatal hernia with Cameron's erosions, history of iron deficiency anemia felt secondary to hiatal hernia, history of gastritis, history of diverticulosis, remote history of colon polyp and history of constipation with hemorrhoids whose here for follow-up.  She was last seen by virtual visit on 07/15/2018.  She is here alone today.  She reports she has been doing well.  For the most part her bowel movements have been occurring regular and she goes daily 90% of the time.  Some straining but she is getting complete and satisfying bowel movement.  She does eat apples and grapes in the evening to help her bowel movements become more regular.  She is used some over-the-counter topical [facility] lidocaine for hemorrhoidal discomfort which is 5%.  This is been tremendously helpful.  Her reflux is been well controlled.  She is not having upper abdominal pain, dysphagia or diet aphasia.  She has been taking her pantoprazole 40 mg daily.  She has been taken off the statin to see if this will help with muscle and joint pain.  She has been back to water aerobics and has increased her activity level.   Review of Systems As per HPI, otherwise negative  Current Medications, Allergies, Past Medical History, Past Surgical History, Family History and Social History were reviewed in [facility] record.     Objective:   Physical Exam BP (!) 142/88   Pulse 78   Wt 201 lb (91.2 kg)   BMI 34.50 kg/m  Gen: awake, alert, NAD HEENT: anicteric, op clear CV: RRR, no mrg Pulm: CTA b/l Abd: soft, NT/ND, +BS throughout Ext: no c/c/e Neuro: nonfocal  CBC    Component Value Date/Time   WBC 6.9 10/20/2018 1546   RBC 4.27 10/20/2018 1546   HGB 13.5 10/20/2018 1546   HGB 13.4 06/24/2018 0827   HCT 40.5  10/20/2018 1546   HCT 40.5 06/24/2018 0827   PLT 201.0 10/20/2018 1546   PLT 204 06/24/2018 0827   MCV 94.8 10/20/2018 1546   MCV 96 06/24/2018 0827   MCH 31.9 06/24/2018 0827   MCH 29.0 07/08/2016 0838   MCHC 33.4 10/20/2018 1546   RDW 13.3 10/20/2018 1546   RDW 13.1 06/24/2018 0827   LYMPHSABS 1.6 10/20/2018 1546   LYMPHSABS 1.2 06/24/2018 0827   MONOABS 0.9 10/20/2018 1546   EOSABS 0.3 10/20/2018 1546   EOSABS 0.6 (H) 06/24/2018 0827   BASOSABS 0.1 10/20/2018 1546   BASOSABS 0.1 06/24/2018 0827   Iron/TIBC/Ferritin/ %Sat    Component Value Date/Time   IRON 91 10/20/2018 1546   FERRITIN 14.3 10/20/2018 1546   IRONPCTSAT 26.0 10/20/2018 1546          Assessment & Plan:  85 year old female with a past medical history of GERD with complex hiatal hernia with Cameron's erosions, history of iron deficiency anemia felt secondary to hiatal hernia, history of gastritis, history of diverticulosis, remote history of colon polyp and history of constipation with hemorrhoids whose here for follow-up.   1.  Iron deficiency anemia --thought secondary to Cameron's lesions related to hiatal hernia.  We repeated blood counts and iron studies today and both are normal.  This is reassuring.  We will follow these over time  2.  GERD with hiatal hernia --continue daily PPI at  40 mg a day.  Good control of symptoms currently  3.  Mild constipation/dyssynergic defecation/hemorrhoids --treated adequately with dietary measures including high-fiber diet.  Will continue over-the-counter topical lidocaine and Preparation H as needed for hemorrhoidal symptoms.  I will see her back in 3 months (patient preference), sooner if needed  25 minutes spent with the patient today. Greater than 50% was spent in counseling and coordination of care with the patient

## 2018-11-19 ENCOUNTER — Telehealth: Payer: Self-pay | Admitting: Cardiology

## 2018-11-19 NOTE — Telephone Encounter (Signed)
Spoke to patient Dr.Jordan reviewed your B/P readings.He advised continue same medications.He advised to resume Crestor.Also ok to take supplements listed coq 10,magnesium,vit d3,vit b complex.

## 2018-11-19 NOTE — Telephone Encounter (Signed)
Will route to primary to make aware. Thanks!

## 2018-11-19 NOTE — Telephone Encounter (Signed)
Patient wanted to make [name] aware that she will be sending a fax to Dr. Martinique later today with her BP readings. The fax may be as long as 3 pages.

## 2018-11-23 ENCOUNTER — Telehealth: Payer: Self-pay | Admitting: Internal Medicine

## 2018-11-23 NOTE — Telephone Encounter (Signed)
Pt does not remember getting the results of her last blood work in September. She would like a call with results, it is ok to leave a detailed message.

## 2018-11-23 NOTE — Telephone Encounter (Signed)
Spoke with pt and she is aware of results.

## 2018-12-29 ENCOUNTER — Other Ambulatory Visit: Payer: Self-pay | Admitting: Obstetrics and Gynecology

## 2018-12-29 DIAGNOSIS — Z1231 Encounter for screening mammogram for malignant neoplasm of breast: Secondary | ICD-10-CM

## 2019-01-01 NOTE — Progress Notes (Signed)
Pamela Gonzales Date of Birth: 11-Sep-1930 Medical Record [id number]  History of Present Illness: Pamela Gonzales is seen back today for follow up of CAD. She has a history of HLD, Lupus, GERD, OA, RBBB and CAD with past stents placed in 2005. She had stenting of the mid LAD and 2nd OM in 2005 with Cypher stents. Last Myoview was in June 2017 and was normal. She has remained on Plavix.   On follow up today she is doing well from a cardiac standpoint.   She does water aerobics at least 3x/week. She has  neuropathy in her feet. She is being evaluated by ortho for a nerve impingement in her neck and a torn meniscus in her knee. She denies any chest pain or SOB.   Current Outpatient Medications  Medication Sig Dispense Refill  . amLODipine (NORVASC) 5 MG tablet Take 1 tablet (5 mg total) by mouth daily. 90 tablet 3  . aspirin 81 MG tablet Take 81 mg by mouth daily.      . Calcium Citrate-Vitamin D (CALCIUM + D PO) Take 1 tablet by mouth 2 (two) times daily.    . clopidogrel (PLAVIX) 75 MG tablet TAKE 1 TABLET BY MOUTH EVERY DAY 90 tablet 3  . dextromethorphan-guaiFENesin (MUCINEX DM) 30-600 MG 12hr tablet Take 2 tablets by mouth daily.    . Lidocaine, Anorectal, (RECTICARE) 5 % CREA Apply topically as needed. Applies after q BM    . loratadine (CLARITIN) 10 MG tablet Take 10 mg by mouth daily.    . Multiple Vitamins-Minerals (MULTIVITAMIN WITH MINERALS) tablet Take 1 tablet by mouth daily.    Marland Kitchen MYRBETRIQ 50 MG TB24 tablet Take 1 tablet by mouth daily.  2  . NASONEX 50 MCG/ACT nasal spray 2 sprays by Nasal route Daily.    . nitroGLYCERIN (NITROSTAT) 0.4 MG SL tablet Place 1 tablet (0.4 mg total) under the tongue every 5 (five) minutes as needed for chest pain. 25 tablet 11  . pantoprazole (PROTONIX) 40 MG tablet TAKE 1 TABLET BY MOUTH EVERY DAY 90 tablet 0  . losartan (COZAAR) 100 MG tablet Take 1 tablet (100 mg total) by mouth daily. 30 tablet 6   No current facility-administered medications for this  visit.     Allergies  Allergen Reactions  . Cephalexin   . Lisinopril Cough  . Oxycodone     itching  . Oxycodone-Acetaminophen   . Penicillins   . Sulfa Drugs Cross Reactors Other (See Comments)    Skin irritation.  . Tramadol     Past Medical History:  Diagnosis Date  . Anemia   . Arthritis   . Asthmatic bronchitis   . Benign fundic gland polyps of stomach   . CAD (coronary artery disease) 05   stents  . Diverticulosis   . Esophageal dysmotility   . Fibula fracture    right  . Gallstones   . Gastric erosions   . GERD (gastroesophageal reflux disease)   . Hiatal hernia   . Hypercholesteremia   . Hypertension   . Internal hemorrhoids   . Joint stiffness   . Lupus ([place])   . Osteoarthritis   . Osteoporosis   . Paresthesia   . Peripheral neuropathy   . Peripheral neuropathy   . RBBB   . Seasonal allergies   . Spinal stenosis   . Tubular adenoma of colon     Past Surgical History:  Procedure Laterality Date  . ABDOMINAL HYSTERECTOMY  1975  . BREAST EXCISIONAL BIOPSY  Right   . CATARACT EXTRACTION, BILATERAL    . CHOLECYSTECTOMY  07/04/2011   Procedure: LAPAROSCOPIC CHOLECYSTECTOMY WITH INTRAOPERATIVE CHOLANGIOGRAM;  Surgeon: Merrie Roof, MD;  Location: [facility];  Service: General;  Laterality: N/A;  . COLONOSCOPY W/ BIOPSIES    . CORONARY ANGIOPLASTY  2005  . ESOPHAGOGASTRODUODENOSCOPY (EGD) WITH PROPOFOL N/A 06/11/2016   Procedure: ESOPHAGOGASTRODUODENOSCOPY (EGD) WITH PROPOFOL;  Surgeon: Garlan Fair, MD;  Location: [facility];  Service: Endoscopy;  Laterality: N/A;  . FIBULA FRACTURE SURGERY  2011   Right Ankle  . FLEXIBLE SIGMOIDOSCOPY N/A 07/09/2016   Procedure: FLEXIBLE SIGMOIDOSCOPY;  Surgeon: Garlan Fair, MD;  Location: [facility];  Service: Endoscopy;  Laterality: N/A;    Social History   Tobacco Use  Smoking Status Never Smoker  Smokeless Tobacco Never Used    Social History   Substance and Sexual Activity  Alcohol Use Yes    Comment: very rare    Family History  Problem Relation Age of Onset  . Lung cancer Father   . Heart attack Mother   . Heart attack Brother   . Heart disease Brother   . Rectal cancer Brother        only 1 kidney  . Cancer Sister   . Pancreatic cancer Sister   . Heart disease Sister   . Anesthesia problems Neg Hx   . Hypotension Neg Hx   . Malignant hyperthermia Neg Hx   . Pseudochol deficiency Neg Hx   . Breast cancer Neg Hx   . Colon cancer Neg Hx   . Stomach cancer Neg Hx     Review of Systems: The review of systems is per the HPI.  All other systems were reviewed and are negative.  Physical Exam: BP 126/76   Pulse 76   Ht 5\' 4"  (1.626 m)   Wt 198 lb 3.2 oz (89.9 kg)   BMI 34.02 kg/m  GENERAL:  Well appearing, obese WF in NAD HEENT:  PERRL, EOMI, sclera are clear. Oropharynx is clear. NECK:  No jugular venous distention, carotid upstroke brisk and symmetric, no bruits, no thyromegaly or adenopathy LUNGS:  Clear to auscultation bilaterally CHEST:  Unremarkable HEART:  RRR,  PMI not displaced or sustained,S1 and S2 within normal limits, no S3, no S4: no clicks, no rubs, no murmurs EXT:  2 + pulses throughout, no edema, no cyanosis no clubbing SKIN:  Warm and dry.  No rashes NEURO:  Alert and oriented x 3. Cranial nerves II through XII intact. PSYCH:  Cognitively intact  LABORATORY DATA:  Lab Results  Component Value Date   WBC 6.9 10/20/2018   HGB 13.5 10/20/2018   HCT 40.5 10/20/2018   PLT 201.0 10/20/2018   GLUCOSE 91 06/24/2018   CHOL 165 06/24/2018   TRIG 128 06/24/2018   HDL 55 06/24/2018   LDLCALC 84 06/24/2018   ALT 15 06/24/2018   AST 19 06/24/2018   NA 143 06/24/2018   K 5.0 06/24/2018   CL 103 06/24/2018   CREATININE 0.80 06/24/2018   BUN 12 06/24/2018   CO2 25 06/24/2018   INR 1.07 08/03/2009   HGBA1C 5.4 05/10/2010   Ecg today shows NSR with RBBB. No change from prior.  I have personally reviewed and interpreted this study.  Myoview  study 07/26/15:Study Highlights    The left ventricular ejection fraction is hyperdynamic (>65%).  Nuclear stress EF: 72%.  There was no ST segment deviation noted during stress.  The study is normal.  This  is a low risk study.   This is a normal pharmacologic nuclear study with no evidence of prior infarct or ischemia. Normal LVEF.       Assessment / Plan: 1. CAD - s/p stenting of the LAD and OM in 2005 with first generation Cypher stents. Normal Myoview in June 2017.  She is asymptomatic. Since she has first generation DES we have continued  DAPT indefinitely. If she should have evidence of bleeding leading to her anemia I would favor stopping Plavix.  2. HTN - BP control is satisfactory. Home BP readings reviewed.   3. HLD - on statin. LDL 84. Goal 70. On maximal dose of Crestor. Per prior discussion she would like to avoid additional medical therapy.  4. Chronic RBBB since 2014.    I will follow up in 6 months

## 2019-01-04 ENCOUNTER — Ambulatory Visit: Payer: Medicare Other | Admitting: Cardiology

## 2019-01-04 ENCOUNTER — Other Ambulatory Visit: Payer: Self-pay

## 2019-01-04 ENCOUNTER — Encounter: Payer: Self-pay | Admitting: Cardiology

## 2019-01-04 VITALS — BP 126/76 | HR 76 | Ht 64.0 in | Wt 198.2 lb

## 2019-01-04 DIAGNOSIS — I10 Essential (primary) hypertension: Secondary | ICD-10-CM

## 2019-01-04 DIAGNOSIS — I4510 Unspecified right bundle-branch block: Secondary | ICD-10-CM | POA: Diagnosis not present

## 2019-01-04 DIAGNOSIS — E7800 Pure hypercholesterolemia, unspecified: Secondary | ICD-10-CM | POA: Diagnosis not present

## 2019-01-04 DIAGNOSIS — I2510 Atherosclerotic heart disease of native coronary artery without angina pectoris: Secondary | ICD-10-CM | POA: Diagnosis not present

## 2019-01-04 NOTE — Addendum Note (Signed)
Addended by: Kathyrn Lass on: 01/04/2019 10:43 AM   Modules accepted: Orders

## 2019-01-04 NOTE — Patient Instructions (Signed)
Stop taking folate

## 2019-01-15 ENCOUNTER — Other Ambulatory Visit: Payer: Self-pay | Admitting: Cardiology

## 2019-01-15 ENCOUNTER — Other Ambulatory Visit: Payer: Self-pay | Admitting: Internal Medicine

## 2019-01-19 ENCOUNTER — Encounter: Payer: Self-pay | Admitting: Internal Medicine

## 2019-01-19 ENCOUNTER — Ambulatory Visit: Payer: Medicare Other | Admitting: Internal Medicine

## 2019-01-19 VITALS — BP 120/60 | HR 81 | Temp 98.8°F | Ht 64.0 in | Wt 198.0 lb

## 2019-01-19 DIAGNOSIS — K649 Unspecified hemorrhoids: Secondary | ICD-10-CM

## 2019-01-19 DIAGNOSIS — K5900 Constipation, unspecified: Secondary | ICD-10-CM

## 2019-01-19 DIAGNOSIS — K449 Diaphragmatic hernia without obstruction or gangrene: Secondary | ICD-10-CM | POA: Diagnosis not present

## 2019-01-19 DIAGNOSIS — K219 Gastro-esophageal reflux disease without esophagitis: Secondary | ICD-10-CM | POA: Diagnosis not present

## 2019-01-19 DIAGNOSIS — D500 Iron deficiency anemia secondary to blood loss (chronic): Secondary | ICD-10-CM

## 2019-01-19 NOTE — Progress Notes (Signed)
Subjective:    Patient ID: Pamela Gonzales, female    DOB: [birth year], 85 y.o.   MRN: [id number]  HPI Pamela Gonzales is an 85 year old female with a history of GERD with complex hiatal hernia with history of Cameron's erosions, history of IDA felt secondary to hiatal hernia, history of gastritis, history of diverticulosis, remote history of colon polyps, history of constipation with hemorrhoids who is here for follow-up.  She is here alone today and was last seen on 10/20/2018.  She is doing well.  She is focusing on her high-fiber diet eating apples, grapes and collards on a regular basis.  This helps with her bowel movements.  She still does tend to constipation.  Hemorrhoids have been in good control she is using witch hazel.  Best her hemorrhoids have been in a long time.  Plenty of energy.  Continues water aerobics.  Is starting physical therapy for her right knee.  Blood pressure has been much improved.  No abdominal pain.  Reflux is well controlled.  She continues pantoprazole 40 mg a day.   Review of Systems As per HPI, otherwise negative  Current Medications, Allergies, Past Medical History, Past Surgical History, Family History and Social History were reviewed in [employer] record.     Objective:   Physical Exam Temp 98.8 F (37.1 C)   Ht 5\' 4"  (1.626 m)   Wt 198 lb (89.8 kg)   BMI 33.99 kg/m  Gen: awake, alert, NAD Neuro: nonfocal  CBC    Component Value Date/Time   WBC 6.9 10/20/2018 1546   RBC 4.27 10/20/2018 1546   HGB 13.5 10/20/2018 1546   HGB 13.4 06/24/2018 0827   HCT 40.5 10/20/2018 1546   HCT 40.5 06/24/2018 0827   PLT 201.0 10/20/2018 1546   PLT 204 06/24/2018 0827   MCV 94.8 10/20/2018 1546   MCV 96 06/24/2018 0827   MCH 31.9 06/24/2018 0827   MCH 29.0 07/08/2016 0838   MCHC 33.4 10/20/2018 1546   RDW 13.3 10/20/2018 1546   RDW 13.1 06/24/2018 0827   LYMPHSABS 1.6 10/20/2018 1546   LYMPHSABS 1.2 06/24/2018 0827   MONOABS 0.9  10/20/2018 1546   EOSABS 0.3 10/20/2018 1546   EOSABS 0.6 (H) 06/24/2018 0827   BASOSABS 0.1 10/20/2018 1546   BASOSABS 0.1 06/24/2018 0827   Iron/TIBC/Ferritin/ %Sat    Component Value Date/Time   IRON 91 10/20/2018 1546   FERRITIN 14.3 10/20/2018 1546   IRONPCTSAT 26.0 10/20/2018 1546         Assessment & Plan:  85 year old female with a history of GERD with complex hiatal hernia with history of Cameron's erosions, history of IDA felt secondary to hiatal hernia, history of gastritis, history of diverticulosis, remote history of colon polyps, history of constipation with hemorrhoids who is here for follow-up.  1.  GERD with hiatal hernia --doing well symptomatically.  Continuing pantoprazole 40 mg a day.  No evidence for GI bleeding associated with her hiatal hernia and prior Cameron's erosions  2.  IDA --blood counts normal and iron studies improved at last check 3 months ago.  Consider repeat in 3 months.  IDA felt secondary to Cameron's lesions.  3.  Mild constipation/dyssynergic defecation/hemorrhoids --has responded well to dietary measures including high-fiber diet.  Continue over-the-counter topical lidocaine, Preparation H and witch hazel for hemorrhoidal symptoms as needed  4.  B vitamin supplementation --she was told to use a Mega-B vitamin but she is already taking Centrum Silver.  We reviewed  the ingredients for both and Centrum Silver alone is sufficient.  5.  Question calcium supplementation --has been taking calcium for some time.  While I do not see an issue, I encouraged her to discuss this with Dr. Martinique for his opinion when she follows up.  This also contains additional vitamin D totaling 2000 units daily.  58-month follow-up per patient request  25 minutes spent with the patient today. Greater than 50% was spent in counseling and coordination of care with the patient

## 2019-01-19 NOTE — Patient Instructions (Signed)
Please follow up with Dr Hilarie Fredrickson in 3 months.  You do not need to take the large vitamin B supplementation.  Please discuss calcium supplementation with Dr Martinique.  If you are age 83 or older, your body mass index should be between 23-30. Your Body mass index is 33.99 kg/m. If this is out of the aforementioned range listed, please consider follow up with your Primary Care Provider.  If you are age 29 or younger, your body mass index should be between 19-25. Your Body mass index is 33.99 kg/m. If this is out of the aformentioned range listed, please consider follow up with your Primary Care Provider.

## 2019-02-22 ENCOUNTER — Other Ambulatory Visit: Payer: Self-pay

## 2019-02-23 ENCOUNTER — Telehealth: Payer: Self-pay | Admitting: *Deleted

## 2019-02-23 ENCOUNTER — Other Ambulatory Visit: Payer: Self-pay

## 2019-02-23 MED ORDER — CLOPIDOGREL BISULFATE 75 MG PO TABS: 75.0000 mg | ORAL_TABLET | Freq: Every day | ORAL | 3 refills | Status: DC

## 2019-02-23 NOTE — Telephone Encounter (Signed)
   [facility] Pre-operative Risk Assessment    Request for surgical clearance:  1. What type of surgery is being performed? RIGHT KNEE SCOPE   2. When is this surgery scheduled? 03/15/19   3. What type of clearance is required (medical clearance vs. Pharmacy clearance to hold med vs. Both)? MEDICAL  4. Are there any medications that need to be held prior to surgery and how long? PLAVIX AND ASA   5. Practice name and name of physician performing surgery? [facility]; DR. FRANK ALUISIO   6. What is your office phone number [phone]    7.   What is your office fax number [phone] ATTN: KELLY HANCOCK  8.   Anesthesia type (None, local, MAC, general) ? CHOICE   Julaine Hua 02/23/2019, 4:45 PM  _________________________________________________________________   (provider comments below)

## 2019-02-24 NOTE — Telephone Encounter (Signed)
   Primary Cardiologist: Dr. Martinique  Chart reviewed as part of pre-operative protocol coverage. Left voice mail to call back to review symptoms.   Dr. Martinique, Faythe Ghee to hold DAPT and how long?  Hx of CAD - s/p stenting of the LAD and OM in 2005 with first generation Cypher stents. Normal Myoview in June 2017.  Since she has first generation DES we have continued  DAPT indefinitely.

## 2019-02-24 NOTE — Telephone Encounter (Signed)
She can hold Plavix for 5-7 days prior to surgery. Does not need to hold ASA.  Janeshia Ciliberto Martinique MD, [facility]

## 2019-02-25 NOTE — Telephone Encounter (Signed)
   Primary Cardiologist: Dr. Martinique  Chart reviewed as part of pre-operative protocol coverage. He was last seen by Dr. Martinique on 01/04/2019 for CAD follow up due to multiple stent placements. She was stable from cardiac standpoint. He also commented on 02/24/2019 that she could hold Plavix for 5-7 days prior to the procedure. She will restart ASAP after the procedure is complete.   Given past medical history and time since last visit, based on ACC/AHA guidelines, Mayce A Clune would be at acceptable risk for the planned procedure without further cardiovascular testing.   I will route this recommendation to the requesting party via Epic fax function and remove from pre-op pool.  Please call with questions.  Phill Myron. [place] Pugh, ANP, AACC  02/25/2019, 8:35 AM

## 2019-02-26 ENCOUNTER — Telehealth: Payer: Self-pay | Admitting: Cardiology

## 2019-02-26 NOTE — Telephone Encounter (Signed)
I called pt and informed her that we have sent the clearance over to the surgeon's office yesterday. I informed her Dr. Martinique gave recommendations to hold Plavix 5-7 days. I did tell her that the actual amount of days will be up to the surgeon whether it be 5 days or 7 days for the Plavix. Pt thanked me for the call.

## 2019-02-26 NOTE — Telephone Encounter (Signed)
New Message     Pt is returning a call she got for her medical clearance    Please call back

## 2019-03-01 ENCOUNTER — Other Ambulatory Visit: Payer: Self-pay

## 2019-03-01 ENCOUNTER — Ambulatory Visit
Admission: RE | Admit: 2019-03-01 | Discharge: 2019-03-01 | Disposition: A | Discharge status: Home / Self Care | Payer: Medicare PPO | Source: Ambulatory Visit | Attending: Obstetrics and Gynecology | Admitting: Obstetrics and Gynecology

## 2019-03-01 DIAGNOSIS — Z1231 Encounter for screening mammogram for malignant neoplasm of breast: Secondary | ICD-10-CM

## 2019-03-08 NOTE — Patient Instructions (Addendum)
DUE TO COVID-19 ONLY ONE VISITOR IS ALLOWED TO COME WITH YOU AND STAY IN THE WAITING ROOM ONLY DURING PRE OP AND PROCEDURE DAY OF SURGERY. THE 1 VISITOR MAY VISIT WITH YOU AFTER SURGERY IN YOUR PRIVATE ROOM DURING VISITING HOURS ONLY!  YOU NEED TO HAVE A COVID 19 TEST ON_2/11______ @__10 :00_____, THIS TEST MUST BE DONE BEFORE SURGERY, COME  Pamela Gonzales , [phone].  ([place]) ONCE YOUR COVID TEST IS COMPLETED, PLEASE BEGIN THE QUARANTINE INSTRUCTIONS AS OUTLINED IN YOUR HANDOUT.                Pamela Gonzales   Your procedure is scheduled on: 03/15/19   Report to [facility] Main  Entrance   Report to admitting at  12:50 pm     Call this number if you have problems the morning of surgery [phone]    . BRUSH YOUR TEETH MORNING OF SURGERY AND RINSE YOUR MOUTH OUT, NO CHEWING GUM CANDY OR MINTS.   Do not eat food After Midnight.   YOU MAY HAVE CLEAR LIQUIDS FROM MIDNIGHT UNTIL 12:00 pm     CLEAR LIQUID DIET   Foods Allowed                                                                     Foods Excluded  Coffee and tea, regular and decaf                             liquids that you cannot  Plain Jell-O any favor except red or purple                                           see through such as: Fruit ices (not with fruit pulp)                                     milk, soups, orange juice  Iced Popsicles                                    All solid food Carbonated beverages, regular and diet                                    Cranberry, grape and apple juices Sports drinks like Gatorade Lightly seasoned clear broth or consume(fat free) Sugar, honey syrup  Breakfast                                Lunch                                 Cranberry juice  Beef broth                      Sample       Jell-O                                     Grape juice                            Coffee or tea                         Jell-O                                                                                      Coffee or tea                         _____________________________________________________________________    At 12:00 PM Please finish the prescribed Pre-Surgery  Drink.   Nothing by mouth after you finish the  drink !   Take these medicines the morning of surgery with A SIP OF WATER: Claritin, Amlodipine, Protonix, Myrbetriq                                 You may not have any metal on your body including hair pins and              piercings  Do not wear jewelry, make-up, lotions, powders or perfumes, deodorant             Do not wear nail polish on your fingernails.  Do not shave  48 hours prior to surgery.              Do not bring valuables to the hospital. [place].  Contacts, dentures or bridgework may not be worn into surgery.      Patients discharged the day of surgery will not be allowed to drive home.   IF YOU ARE HAVING SURGERY AND GOING HOME THE SAME DAY, YOU MUST HAVE AN ADULT TO DRIVE YOU HOME AND BE WITH YOU FOR 24 HOURS.   YOU MAY GO HOME BY TAXI OR UBER OR ORTHERWISE, BUT AN ADULT MUST ACCOMPANY YOU HOME AND STAY WITH YOU FOR 24 HOURS.  Name and phone number of your driver:  Special Instructions: N/A              Please read over the following fact sheets you were given: _____________________________________________________________________             [facility] - Preparing for Surgery  Before surgery, you can play an important role.   Because skin is not sterile, your skin needs to be as free of germs as possible.   You can reduce the number of germs on your skin by washing  with CHG (chlorahexidine gluconate) soap before surgery.   CHG is an antiseptic cleaner which kills germs and bonds with the skin to continue killing germs even after washing. Please DO NOT use if you have an allergy to CHG or antibacterial  soaps.   If your skin becomes reddened/irritated stop using the CHG and inform your nurse when you arrive at Short Stay. Do not shave (including legs and underarms) for at least 48 hours prior to the first CHG shower.    Please follow these instructions carefully:  1.  Shower with CHG Soap the night before surgery and the  morning of Surgery.  2.  If you choose to wash your hair, wash your hair first as usual with your  normal  shampoo.  3.  After you shampoo, rinse your hair and body thoroughly to remove the  shampoo.                                        4.  Use CHG as you would any other liquid soap.  You can apply chg directly  to the skin and wash                       Gently with a scrungie or clean washcloth.  5.  Apply the CHG Soap to your body ONLY FROM THE NECK DOWN.   Do not use on face/ open                           Wound or open sores. Avoid contact with eyes, ears mouth and genitals (private parts).                       Wash face,  Genitals (private parts) with your normal soap.             6.  Wash thoroughly, paying special attention to the area where your surgery  will be performed.  7.  Thoroughly rinse your body with warm water from the neck down.  8.  DO NOT shower/wash with your normal soap after using and rinsing off  the CHG Soap.             9.  Pat yourself dry with a clean towel.            10.  Wear clean pajamas.            11.  Place clean sheets on your bed the night of your first shower and do not  sleep with pets. Day of Surgery : Do not apply any lotions/deodorants the morning of surgery.  Please wear clean clothes to the hospital/surgery center.  FAILURE TO FOLLOW THESE INSTRUCTIONS MAY RESULT IN THE CANCELLATION OF YOUR SURGERY PATIENT SIGNATURE_________________________________  NURSE SIGNATURE__________________________________  ________________________________________________________________________   Pamela Gonzales  An incentive spirometer  is a tool that can help keep your lungs clear and active. This tool measures how well you are filling your lungs with each breath. Taking long deep breaths may help reverse or decrease the chance of developing breathing (pulmonary) problems (especially infection) following:  A long period of time when you are unable to move or be active. BEFORE THE PROCEDURE   If the spirometer includes an indicator to show your best effort, your nurse or respiratory therapist will set it to  a desired goal.  If possible, sit up straight or lean slightly forward. Try not to slouch.  Hold the incentive spirometer in an upright position. INSTRUCTIONS FOR USE  1. Sit on the edge of your bed if possible, or sit up as far as you can in bed or on a chair. 2. Hold the incentive spirometer in an upright position. 3. Breathe out normally. 4. Place the mouthpiece in your mouth and seal your lips tightly around it. 5. Breathe in slowly and as deeply as possible, raising the piston or the ball toward the top of the column. 6. Hold your breath for 3-5 seconds or for as long as possible. Allow the piston or ball to fall to the bottom of the column. 7. Remove the mouthpiece from your mouth and breathe out normally. 8. Rest for a few seconds and repeat Steps 1 through 7 at least 10 times every 1-2 hours when you are awake. Take your time and take a few normal breaths between deep breaths. 9. The spirometer may include an indicator to show your best effort. Use the indicator as a goal to work toward during each repetition. 10. After each set of 10 deep breaths, practice coughing to be sure your lungs are clear. If you have an incision (the cut made at the time of surgery), support your incision when coughing by placing a pillow or rolled up towels firmly against it. Once you are able to get out of bed, walk around indoors and cough well. You may stop using the incentive spirometer when instructed by your caregiver.  RISKS AND  COMPLICATIONS  Take your time so you do not get dizzy or light-headed.  If you are in pain, you may need to take or ask for pain medication before doing incentive spirometry. It is harder to take a deep breath if you are having pain. AFTER USE  Rest and breathe slowly and easily.  It can be helpful to keep track of a log of your progress. Your caregiver can provide you with a simple table to help with this. If you are using the spirometer at home, follow these instructions: [place] IF:   You are having difficultly using the spirometer.  You have trouble using the spirometer as often as instructed.  Your pain medication is not giving enough relief while using the spirometer.  You develop fever of 100.5 F (38.1 C) or higher. SEEK IMMEDIATE MEDICAL CARE IF:   You cough up bloody sputum that had not been present before.  You develop fever of 102 F (38.9 C) or greater.  You develop worsening pain at or near the incision site. MAKE SURE YOU:   Understand these instructions.  Will watch your condition.  Will get help right away if you are not doing well or get worse. Document Released: 05/27/2006 Document Revised: 04/08/2011 Document Reviewed: 07/28/2006 [facility] Patient Information 2014 [place], [place].   ____________________________________________________________________

## 2019-03-10 ENCOUNTER — Encounter (HOSPITAL_COMMUNITY): Payer: Self-pay

## 2019-03-10 ENCOUNTER — Encounter (HOSPITAL_COMMUNITY)
Admission: RE | Admit: 2019-03-10 | Discharge: 2019-03-10 | Disposition: A | Discharge status: Home / Self Care | Payer: Medicare PPO | Source: Ambulatory Visit | Attending: Orthopedic Surgery | Admitting: Orthopedic Surgery

## 2019-03-10 ENCOUNTER — Other Ambulatory Visit: Payer: Self-pay

## 2019-03-10 DIAGNOSIS — S83241A Other tear of medial meniscus, current injury, right knee, initial encounter: Secondary | ICD-10-CM | POA: Diagnosis not present

## 2019-03-10 DIAGNOSIS — Z79899 Other long term (current) drug therapy: Secondary | ICD-10-CM | POA: Diagnosis not present

## 2019-03-10 DIAGNOSIS — X58XXXA Exposure to other specified factors, initial encounter: Secondary | ICD-10-CM | POA: Insufficient documentation

## 2019-03-10 DIAGNOSIS — Z01818 Encounter for other preprocedural examination: Secondary | ICD-10-CM | POA: Diagnosis present

## 2019-03-10 DIAGNOSIS — Z7902 Long term (current) use of antithrombotics/antiplatelets: Secondary | ICD-10-CM | POA: Diagnosis not present

## 2019-03-10 DIAGNOSIS — Z7982 Long term (current) use of aspirin: Secondary | ICD-10-CM | POA: Insufficient documentation

## 2019-03-10 DIAGNOSIS — G629 Polyneuropathy, unspecified: Secondary | ICD-10-CM | POA: Diagnosis not present

## 2019-03-10 DIAGNOSIS — Z955 Presence of coronary angioplasty implant and graft: Secondary | ICD-10-CM | POA: Diagnosis not present

## 2019-03-10 DIAGNOSIS — I10 Essential (primary) hypertension: Secondary | ICD-10-CM | POA: Insufficient documentation

## 2019-03-10 DIAGNOSIS — I2510 Atherosclerotic heart disease of native coronary artery without angina pectoris: Secondary | ICD-10-CM | POA: Insufficient documentation

## 2019-03-10 DIAGNOSIS — I4510 Unspecified right bundle-branch block: Secondary | ICD-10-CM | POA: Insufficient documentation

## 2019-03-10 DIAGNOSIS — K219 Gastro-esophageal reflux disease without esophagitis: Secondary | ICD-10-CM | POA: Diagnosis not present

## 2019-03-10 DIAGNOSIS — E7800 Pure hypercholesterolemia, unspecified: Secondary | ICD-10-CM | POA: Diagnosis not present

## 2019-03-10 LAB — BASIC METABOLIC PANEL
Anion gap: 6 (ref 5–15)
BUN: 19 mg/dL (ref 8–23)
CO2: 28 mmol/L (ref 22–32)
Calcium: 9.4 mg/dL (ref 8.9–10.3)
Chloride: 104 mmol/L (ref 98–111)
Creatinine, Ser: 0.75 mg/dL (ref 0.44–1.00)
GFR calc Af Amer: >60 mL/min (ref >60)
GFR calc non Af Amer: >60 mL/min (ref >60)
Glucose, Bld: 86 mg/dL (ref 70–99)
Potassium: 4.2 mmol/L (ref 3.5–5.1)
Sodium: 138 mmol/L (ref 135–145)

## 2019-03-10 LAB — CBC
HCT: 42.5 % (ref 36.0–46.0)
Hemoglobin: 13.9 g/dL (ref 12.0–15.0)
MCH: 32.3 pg (ref 26.0–34.0)
MCHC: 32.7 g/dL (ref 30.0–36.0)
MCV: 98.6 fL (ref 80.0–100.0)
Platelets: 199 10*3/uL (ref 150–400)
RBC: 4.31 MIL/uL (ref 3.87–5.11)
RDW: 13.1 % (ref 11.5–15.5)
WBC: 8.2 10*3/uL (ref 4.0–10.5)
nRBC: 0 % (ref 0.0–0.2)

## 2019-03-10 NOTE — Progress Notes (Signed)
PCP - Dr. Cristi Loron Cardiologist - Dr. Volanda Napoleon  Chest x-ray - no EKG - 01/04/19 Stress Test - 2017 ECHO - no Cardiac Cath - 2005  Sleep Study - no CPAP - no  Fasting Blood Sugar - NA Checks Blood Sugar _____ times a day  Blood Thinner Instructions:ASA and Plavix Aspirin Instructions:Dr. Jorden said to stop 5-7 days pior to DOS Last Dose:Plavix-2/10, ASA 2/11  Anesthesia review:   Patient denies shortness of breath, fever, cough and chest pain at PAT appointment  yes Patient verbalized understanding of instructions that were given to them at the PAT appointment. Patient was also instructed that they will need to review over the PAT instructions again at home before surgery. yes

## 2019-03-10 NOTE — Anesthesia Preprocedure Evaluation (Addendum)
Anesthesia Evaluation  Patient identified by MRN, date of birth, ID band Patient awake    Reviewed: Allergy & Precautions, NPO status , Patient's Chart, lab work & pertinent test results  History of Anesthesia Complications Negative for: history of anesthetic complications  Airway Mallampati: II  TM Distance: >3 FB Neck ROM: Full    Dental  (+) Dental Advisory Given, Teeth Intact   Pulmonary    Pulmonary exam normal        Cardiovascular hypertension, Pt. on medications (-) angina+ CAD and + Cardiac Stents  Normal cardiovascular exam+ dysrhythmias    Pt cleared by cardiology.  Per Jory Sims, NP, "He was last seen by Dr. Martinique on 01/04/2019 for CAD follow up due to multiple stent placements. She was stable from cardiac standpoint. He also commented on 02/24/2019 that she could hold Plavix for 5-7 days prior to the procedure. She will restart ASAP after the procedure is complete.Given past medical history and time since last visit, based on ACC/AHA guidelines,Lucindy A Brownwould be at acceptable risk for the planned procedure without further cardiovascular testing."  '17 Myoperfusion - The left ventricular ejection fraction is hyperdynamic (>65%). Nuclear stress EF: 72%. There was no ST segment deviation noted during stress. The study is normal. This is a low risk study.    Neuro/Psych  Spinal stenosis   Neuromuscular disease (paresthesia, peripheral neuropathy) negative psych ROS   GI/Hepatic Neg liver ROS, hiatal hernia, PUD, GERD  Medicated and Controlled, Esophageal dysmotility    Endo/Other   Obesity   Renal/GU negative Renal ROS     Musculoskeletal  (+) Arthritis ,   Abdominal   Peds  Hematology  On clopidogrel    Anesthesia Other Findings Lupus Covid neg 03/11/19   Reproductive/Obstetrics                           Anesthesia Physical Anesthesia Plan  ASA:  III  Anesthesia Plan: General   Post-op Pain Management:    Induction: Intravenous  PONV Risk Score and Plan: 3 and Treatment may vary due to age or medical condition and Ondansetron  Airway Management Planned: LMA  Additional Equipment: None  Intra-op Plan:   Post-operative Plan: Extubation in OR  Informed Consent: I have reviewed the patients History and Physical, chart, labs and discussed the procedure including the risks, benefits and alternatives for the proposed anesthesia with the patient or authorized representative who has indicated his/her understanding and acceptance.     Dental advisory given  Plan Discussed with: CRNA and Anesthesiologist  Anesthesia Plan Comments:       Anesthesia Quick Evaluation

## 2019-03-10 NOTE — Progress Notes (Signed)
Anesthesia Chart Review   Case: [id number] Date/Time: 03/15/19 1454   Procedure: Right knee arthroscopy; meniscal debridement (Right Knee) - 49min   Anesthesia type: Choice   Pre-op diagnosis: right knee medial meniscal tear   [facility]: [facility] ROOM 10 / [facility]   Surgeons: Gaynelle Arabian, MD      DISCUSSION:86 y.o. never smoker with h/o GERD, RBBB, CAD (stenting of mid LAD and 2nd OM in 2005), HTN, right knee medial meniscal tear scheduled for above procedure 03/15/19 with Dr. Gaynelle Arabian.   Pt cleared by cardiology.  Per Jory Sims, NP, "He was last seen by Dr. Martinique on 01/04/2019 for CAD follow up due to multiple stent placements. She was stable from cardiac standpoint. He also commented on 02/24/2019 that she could hold Plavix for 5-7 days prior to the procedure. She will restart ASAP after the procedure is complete.  Given past medical history and time since last visit, based on ACC/AHA guidelines, Viviann A Dach would be at acceptable risk for the planned procedure without further cardiovascular testing."  Anticipate pt can proceed with planned procedure barring acute status change.   VS: BP (!) 153/86   Pulse 83   Temp 36.8 C (Oral)   Resp 18   Ht 5\' 4"  (1.626 m)   Wt 91.3 kg   SpO2 98%   BMI 34.57 kg/m   PROVIDERS: Lawerance Cruel, MD is PCP   Martinique, Peter, MD is Cardiologist  LABS: Labs reviewed: Acceptable for surgery. (all labs ordered are listed, but only abnormal results are displayed)  Labs Reviewed  BASIC METABOLIC PANEL  CBC     IMAGES:   EKG: 01/04/2019 Rate 76 bpm Normal sinus rhythm  Right bundle branch block   CV: Myocardial Perfusion 07/26/2015  The left ventricular ejection fraction is hyperdynamic (>65%).  Nuclear stress EF: 72%.  There was no ST segment deviation noted during stress.  The study is normal.  This is a low risk study.   This is a normal pharmacologic nuclear study with no evidence of prior infarct or  ischemia. Normal LVEF Past Medical History:  Diagnosis Date  . Anemia   . Arthritis   . Asthmatic bronchitis   . Benign fundic gland polyps of stomach   . CAD (coronary artery disease) 05   stents  . Diverticulosis   . Esophageal dysmotility   . Fibula fracture    right  . Gallstones   . Gastric erosions   . GERD (gastroesophageal reflux disease)   . Hiatal hernia   . Hypercholesteremia   . Hypertension   . Internal hemorrhoids   . Joint stiffness   . Lupus ([place])   . Osteoarthritis   . Osteoporosis   . Paresthesia   . Peripheral neuropathy   . Peripheral neuropathy   . RBBB   . Seasonal allergies   . Spinal stenosis   . Tubular adenoma of colon     Past Surgical History:  Procedure Laterality Date  . ABDOMINAL HYSTERECTOMY  1975  . BREAST EXCISIONAL BIOPSY Right   . CATARACT EXTRACTION, BILATERAL    . CHOLECYSTECTOMY  07/04/2011   Procedure: LAPAROSCOPIC CHOLECYSTECTOMY WITH INTRAOPERATIVE CHOLANGIOGRAM;  Surgeon: Merrie Roof, MD;  Location: [facility];  Service: General;  Laterality: N/A;  . COLONOSCOPY W/ BIOPSIES    . CORONARY ANGIOPLASTY  2005  . ESOPHAGOGASTRODUODENOSCOPY (EGD) WITH PROPOFOL N/A 06/11/2016   Procedure: ESOPHAGOGASTRODUODENOSCOPY (EGD) WITH PROPOFOL;  Surgeon: Garlan Fair, MD;  [facility];  Service:  Endoscopy;  Laterality: N/A;  . FIBULA FRACTURE SURGERY  2011   Right Ankle  . FLEXIBLE SIGMOIDOSCOPY N/A 07/09/2016   Procedure: FLEXIBLE SIGMOIDOSCOPY;  Surgeon: Garlan Fair, MD;  Location: [facility];  Service: Endoscopy;  Laterality: N/A;    MEDICATIONS: . amLODipine (NORVASC) 5 MG tablet  . aspirin 81 MG tablet  . clopidogrel (PLAVIX) 75 MG tablet  . dextromethorphan-guaiFENesin (MUCINEX DM) 30-600 MG 12hr tablet  . Lidocaine, Anorectal, (RECTICARE) 5 % CREA  . loratadine (CLARITIN) 10 MG tablet  . losartan (COZAAR) 100 MG tablet  . Multiple Vitamins-Minerals (MULTIVITAMIN WITH MINERALS) tablet  . MYRBETRIQ 50 MG  TB24 tablet  . NASONEX 50 MCG/ACT nasal spray  . nitroGLYCERIN (NITROSTAT) 0.4 MG SL tablet  . pantoprazole (PROTONIX) 40 MG tablet  . rosuvastatin (CRESTOR) 40 MG tablet   No current facility-administered medications for this encounter.   Maia Plan [facility] Pre-Surgical Testing [phone] 03/10/19  3:32 PM

## 2019-03-11 ENCOUNTER — Other Ambulatory Visit (HOSPITAL_COMMUNITY)
Admission: RE | Admit: 2019-03-11 | Discharge: 2019-03-11 | Disposition: A | Discharge status: Home / Self Care | Payer: Medicare PPO | Source: Ambulatory Visit | Attending: Orthopedic Surgery | Admitting: Orthopedic Surgery

## 2019-03-11 DIAGNOSIS — Z20822 Contact with and (suspected) exposure to covid-19: Secondary | ICD-10-CM | POA: Insufficient documentation

## 2019-03-11 DIAGNOSIS — Z01812 Encounter for preprocedural laboratory examination: Secondary | ICD-10-CM | POA: Insufficient documentation

## 2019-03-11 LAB — SARS CORONAVIRUS 2 (TAT 6-24 HRS): SARS Coronavirus 2: NEGATIVE

## 2019-03-14 DIAGNOSIS — S83241D Other tear of medial meniscus, current injury, right knee, subsequent encounter: Secondary | ICD-10-CM

## 2019-03-14 NOTE — H&P (Signed)
CC- Pamela Gonzales is a 86 y.o. female who presents with right knee pain.  HPI- . Knee Pain: Patient presents with knee pain involving the  right knee. Onset of the symptoms was several months ago. Inciting event: none known. Current symptoms include giving out, pain located medially and swelling. Pain is aggravated by going up and down stairs, pivoting, rising after sitting, squatting, standing and walking.  Patient has had no prior knee problems. Evaluation to date: plain films: abnormal mild osteoarthritis medially and MRI: abnormal medial meniscal tear. Treatment to date: corticosteroid injection which was not very effective.  Past Medical History:  Diagnosis Date  . Anemia   . Arthritis   . Asthmatic bronchitis   . Benign fundic gland polyps of stomach   . CAD (coronary artery disease) 05   stents  . Diverticulosis   . Esophageal dysmotility   . Fibula fracture    right  . Gallstones   . Gastric erosions   . GERD (gastroesophageal reflux disease)   . Hiatal hernia   . Hypercholesteremia   . Hypertension   . Internal hemorrhoids   . Joint stiffness   . Lupus ([place])   . Osteoarthritis   . Osteoporosis   . Paresthesia   . Peripheral neuropathy   . Peripheral neuropathy   . RBBB   . Seasonal allergies   . Spinal stenosis   . Tubular adenoma of colon     Past Surgical History:  Procedure Laterality Date  . ABDOMINAL HYSTERECTOMY  1975  . BREAST EXCISIONAL BIOPSY Right   . CATARACT EXTRACTION, BILATERAL    . CHOLECYSTECTOMY  07/04/2011   Procedure: LAPAROSCOPIC CHOLECYSTECTOMY WITH INTRAOPERATIVE CHOLANGIOGRAM;  Surgeon: Merrie Roof, MD;  Location: [facility];  Service: General;  Laterality: N/A;  . COLONOSCOPY W/ BIOPSIES    . CORONARY ANGIOPLASTY  2005  . ESOPHAGOGASTRODUODENOSCOPY (EGD) WITH PROPOFOL N/A 06/11/2016   Procedure: ESOPHAGOGASTRODUODENOSCOPY (EGD) WITH PROPOFOL;  Surgeon: Garlan Fair, MD;  Location: [facility];  Service: Endoscopy;  Laterality: N/A;  .  FIBULA FRACTURE SURGERY  2011   Right Ankle  . FLEXIBLE SIGMOIDOSCOPY N/A 07/09/2016   Procedure: FLEXIBLE SIGMOIDOSCOPY;  Surgeon: Garlan Fair, MD;  Location: [facility];  Service: Endoscopy;  Laterality: N/A;    Prior to Admission medications   Medication Sig Start Date End Date Taking? Authorizing Provider  amLODipine (NORVASC) 5 MG tablet Take 1 tablet (5 mg total) by mouth daily. 10/06/18  Yes Martinique, Peter M, MD  aspirin 81 MG tablet Take 81 mg by mouth daily.     Yes [provider]  dextromethorphan-guaiFENesin (MUCINEX DM) 30-600 MG 12hr tablet Take 2 tablets by mouth daily.   Yes [provider]  Lidocaine, Anorectal, (RECTICARE) 5 % CREA Place 1 application rectally daily as needed (after bowel movements).    Yes [provider]  loratadine (CLARITIN) 10 MG tablet Take 10 mg by mouth daily.   Yes [provider]  losartan (COZAAR) 100 MG tablet TAKE 1 TABLET BY MOUTH EVERY DAY Patient taking differently: Take 100 mg by mouth daily.  01/18/19  Yes Martinique, Peter M, MD  Multiple Vitamins-Minerals (MULTIVITAMIN WITH MINERALS) tablet Take 1 tablet by mouth daily.   Yes [provider]  MYRBETRIQ 50 MG TB24 tablet Take 50 mg by mouth daily.  11/03/13  Yes [provider]  NASONEX 50 MCG/ACT nasal spray 2 sprays by Nasal route Daily. 03/15/10  Yes [provider]  nitroGLYCERIN (NITROSTAT) 0.4 MG  SL tablet Place 1 tablet (0.4 mg total) under the tongue every 5 (five) minutes as needed for chest pain. 12/27/16  Yes Martinique, Peter M, MD  pantoprazole (PROTONIX) 40 MG tablet TAKE 1 TABLET BY MOUTH EVERY DAY Patient taking differently: Take 40 mg by mouth daily.  01/15/19  Yes Pyrtle, Lajuan Lines, MD  rosuvastatin (CRESTOR) 40 MG tablet Take 40 mg by mouth daily. 02/20/19  Yes [provider]  clopidogrel (PLAVIX) 75 MG tablet Take 1 tablet (75 mg total) by mouth daily. 02/23/19   [place], Peter M, MD   Right knee exam antalgic  gait, soft tissue tenderness over medial joint line, no effusion, negative drawer sign, collateral ligaments intact  Physical Examination: General appearance - alert, well appearing, and in no distress Mental status - alert, oriented to person, place, and time Chest - clear to auscultation, no wheezes, rales or rhonchi, symmetric air entry Heart - normal rate, regular rhythm, normal S1, S2, no murmurs, rubs, clicks or gallops Abdomen - soft, nontender, nondistended, no masses or organomegaly Neurological - alert, oriented, normal speech, no focal findings or movement disorder noted   Asessment/Plan--- Right knee medial meniscal tear- - Plan right knee arthroscopy with meniscal debridement. Procedure risks and potential comps discussed with patient who elects to proceed. Goals are decreased pain and increased function with a high likelihood of achieving both

## 2019-03-15 ENCOUNTER — Telehealth (HOSPITAL_COMMUNITY): Payer: Self-pay | Admitting: *Deleted

## 2019-03-15 ENCOUNTER — Encounter (HOSPITAL_COMMUNITY): Payer: Self-pay | Admitting: Orthopedic Surgery

## 2019-03-15 ENCOUNTER — Encounter (HOSPITAL_COMMUNITY)
Admission: RE | Disposition: A | Discharge status: Home / Self Care | Payer: Self-pay | Source: Other Acute Inpatient Hospital | Attending: Orthopedic Surgery

## 2019-03-15 ENCOUNTER — Ambulatory Visit (HOSPITAL_COMMUNITY): Payer: Medicare PPO | Admitting: Physician Assistant

## 2019-03-15 ENCOUNTER — Ambulatory Visit (HOSPITAL_COMMUNITY): Payer: Medicare PPO | Admitting: Anesthesiology

## 2019-03-15 ENCOUNTER — Ambulatory Visit (HOSPITAL_COMMUNITY)
Admission: RE | Admit: 2019-03-15 | Discharge: 2019-03-15 | Disposition: A | Discharge status: Home / Self Care | Payer: Medicare PPO | Source: Other Acute Inpatient Hospital | Attending: Orthopedic Surgery | Admitting: Orthopedic Surgery

## 2019-03-15 DIAGNOSIS — E7800 Pure hypercholesterolemia, unspecified: Secondary | ICD-10-CM | POA: Insufficient documentation

## 2019-03-15 DIAGNOSIS — Z79899 Other long term (current) drug therapy: Secondary | ICD-10-CM | POA: Diagnosis not present

## 2019-03-15 DIAGNOSIS — I10 Essential (primary) hypertension: Secondary | ICD-10-CM | POA: Insufficient documentation

## 2019-03-15 DIAGNOSIS — Z955 Presence of coronary angioplasty implant and graft: Secondary | ICD-10-CM | POA: Diagnosis not present

## 2019-03-15 DIAGNOSIS — K219 Gastro-esophageal reflux disease without esophagitis: Secondary | ICD-10-CM | POA: Diagnosis not present

## 2019-03-15 DIAGNOSIS — S83241D Other tear of medial meniscus, current injury, right knee, subsequent encounter: Secondary | ICD-10-CM

## 2019-03-15 DIAGNOSIS — Z7982 Long term (current) use of aspirin: Secondary | ICD-10-CM | POA: Diagnosis not present

## 2019-03-15 DIAGNOSIS — M25561 Pain in right knee: Secondary | ICD-10-CM | POA: Diagnosis present

## 2019-03-15 DIAGNOSIS — Z7902 Long term (current) use of antithrombotics/antiplatelets: Secondary | ICD-10-CM | POA: Insufficient documentation

## 2019-03-15 DIAGNOSIS — S83241A Other tear of medial meniscus, current injury, right knee, initial encounter: Secondary | ICD-10-CM | POA: Insufficient documentation

## 2019-03-15 DIAGNOSIS — M329 Systemic lupus erythematosus, unspecified: Secondary | ICD-10-CM | POA: Diagnosis not present

## 2019-03-15 DIAGNOSIS — I2510 Atherosclerotic heart disease of native coronary artery without angina pectoris: Secondary | ICD-10-CM | POA: Insufficient documentation

## 2019-03-15 DIAGNOSIS — X58XXXA Exposure to other specified factors, initial encounter: Secondary | ICD-10-CM | POA: Insufficient documentation

## 2019-03-15 HISTORY — PX: KNEE ARTHROSCOPY: SHX127

## 2019-03-15 SURGERY — ARTHROSCOPY, KNEE
Anesthesia: General | Site: Knee | Laterality: Right

## 2019-03-15 MED ORDER — METOCLOPRAMIDE HCL 5 MG/ML IJ SOLN: 5.0000 mg | Freq: Three times a day (TID) | INTRAMUSCULAR | Status: DC | PRN

## 2019-03-15 MED ORDER — LACTATED RINGERS IV SOLN: INTRAVENOUS | Status: DC

## 2019-03-15 MED ORDER — FENTANYL CITRATE (PF) 100 MCG/2ML IJ SOLN
INTRAMUSCULAR | Status: AC
  Filled 2019-03-15: qty 2

## 2019-03-15 MED ORDER — PROPOFOL 10 MG/ML IV BOLUS
INTRAVENOUS | Status: AC
  Filled 2019-03-15: qty 20

## 2019-03-15 MED ORDER — METHOCARBAMOL 500 MG PO TABS: 500.0000 mg | ORAL_TABLET | Freq: Four times a day (QID) | ORAL | 0 refills | Status: DC | PRN

## 2019-03-15 MED ORDER — PROPOFOL 10 MG/ML IV BOLUS
INTRAVENOUS | Status: DC | PRN
  Administered 2019-03-15: 100 mg via INTRAVENOUS

## 2019-03-15 MED ORDER — ONDANSETRON HCL 4 MG/2ML IJ SOLN: 4.0000 mg | Freq: Four times a day (QID) | INTRAMUSCULAR | Status: DC | PRN

## 2019-03-15 MED ORDER — MORPHINE SULFATE (PF) 4 MG/ML IV SOLN: 0.5000 mg | INTRAVENOUS | Status: DC | PRN

## 2019-03-15 MED ORDER — FENTANYL CITRATE (PF) 100 MCG/2ML IJ SOLN
INTRAMUSCULAR | Status: DC | PRN
  Administered 2019-03-15 (×4): 50 ug via INTRAVENOUS

## 2019-03-15 MED ORDER — SODIUM CHLORIDE 0.9 % IV SOLN: INTRAVENOUS | Status: DC

## 2019-03-15 MED ORDER — HYDROCODONE-ACETAMINOPHEN 5-325 MG PO TABS: 1.0000 | ORAL_TABLET | Freq: Four times a day (QID) | ORAL | Status: DC | PRN

## 2019-03-15 MED ORDER — POVIDONE-IODINE 10 % EX SWAB
2.0000 "application " | Freq: Once | CUTANEOUS | Status: AC
  Administered 2019-03-15: 2 via TOPICAL

## 2019-03-15 MED ORDER — LIDOCAINE 2% (20 MG/ML) 5 ML SYRINGE
INTRAMUSCULAR | Status: AC
  Filled 2019-03-15: qty 5

## 2019-03-15 MED ORDER — METOCLOPRAMIDE HCL 5 MG PO TABS: 5.0000 mg | ORAL_TABLET | Freq: Three times a day (TID) | ORAL | Status: DC | PRN

## 2019-03-15 MED ORDER — ONDANSETRON HCL 4 MG/2ML IJ SOLN: 4.0000 mg | Freq: Once | INTRAMUSCULAR | Status: DC | PRN

## 2019-03-15 MED ORDER — BUPIVACAINE HCL 0.25 % IJ SOLN
INTRAMUSCULAR | Status: AC
  Filled 2019-03-15: qty 1

## 2019-03-15 MED ORDER — ONDANSETRON HCL 4 MG/2ML IJ SOLN
INTRAMUSCULAR | Status: DC | PRN
  Administered 2019-03-15: 4 mg via INTRAVENOUS

## 2019-03-15 MED ORDER — ONDANSETRON HCL 4 MG PO TABS: 4.0000 mg | ORAL_TABLET | Freq: Four times a day (QID) | ORAL | Status: DC | PRN

## 2019-03-15 MED ORDER — FENTANYL CITRATE (PF) 100 MCG/2ML IJ SOLN: 25.0000 ug | INTRAMUSCULAR | Status: DC | PRN

## 2019-03-15 MED ORDER — DEXAMETHASONE SODIUM PHOSPHATE 10 MG/ML IJ SOLN
INTRAMUSCULAR | Status: DC | PRN
  Administered 2019-03-15: 10 mg via INTRAVENOUS

## 2019-03-15 MED ORDER — CHLORHEXIDINE GLUCONATE 4 % EX LIQD: 60.0000 mL | Freq: Once | CUTANEOUS | Status: DC

## 2019-03-15 MED ORDER — ONDANSETRON HCL 4 MG/2ML IJ SOLN
INTRAMUSCULAR | Status: AC
  Filled 2019-03-15: qty 2

## 2019-03-15 MED ORDER — VANCOMYCIN HCL IN DEXTROSE 1-5 GM/200ML-% IV SOLN
1000.0000 mg | INTRAVENOUS | Status: AC
  Administered 2019-03-15: 1000 mg via INTRAVENOUS
  Filled 2019-03-15: qty 200

## 2019-03-15 MED ORDER — LACTATED RINGERS IR SOLN
Status: DC | PRN
  Administered 2019-03-15: 6000 mL

## 2019-03-15 MED ORDER — METHOCARBAMOL 500 MG PO TABS: 500.0000 mg | ORAL_TABLET | Freq: Four times a day (QID) | ORAL | Status: DC | PRN

## 2019-03-15 MED ORDER — METHOCARBAMOL 500 MG IVPB - SIMPLE MED: 500.0000 mg | Freq: Four times a day (QID) | INTRAVENOUS | Status: DC | PRN

## 2019-03-15 MED ORDER — SODIUM CHLORIDE 0.9 % IR SOLN
Status: DC | PRN
  Administered 2019-03-15: 1000 mL

## 2019-03-15 MED ORDER — BISACODYL 10 MG RE SUPP: 10.0000 mg | Freq: Every day | RECTAL | Status: DC | PRN

## 2019-03-15 MED ORDER — LIDOCAINE 2% (20 MG/ML) 5 ML SYRINGE
INTRAMUSCULAR | Status: DC | PRN
  Administered 2019-03-15: 80 mg via INTRAVENOUS

## 2019-03-15 MED ORDER — DOCUSATE SODIUM 100 MG PO CAPS: 100.0000 mg | ORAL_CAPSULE | Freq: Two times a day (BID) | ORAL | Status: DC

## 2019-03-15 MED ORDER — DEXAMETHASONE SODIUM PHOSPHATE 10 MG/ML IJ SOLN
INTRAMUSCULAR | Status: AC
  Filled 2019-03-15: qty 1

## 2019-03-15 MED ORDER — ENSURE PRE-SURGERY PO LIQD
296.0000 mL | Freq: Once | ORAL | Status: DC
  Filled 2019-03-15: qty 296

## 2019-03-15 MED ORDER — BUPIVACAINE HCL (PF) 0.25 % IJ SOLN
INTRAMUSCULAR | Status: DC | PRN
  Administered 2019-03-15: 20 mL

## 2019-03-15 MED ORDER — DIPHENHYDRAMINE HCL 12.5 MG/5ML PO ELIX: 12.5000 mg | ORAL_SOLUTION | ORAL | Status: DC | PRN

## 2019-03-15 MED ORDER — FLEET ENEMA 7-19 GM/118ML RE ENEM: 1.0000 | ENEMA | Freq: Once | RECTAL | Status: DC | PRN

## 2019-03-15 MED ORDER — MENTHOL 3 MG MT LOZG: 1.0000 | LOZENGE | OROMUCOSAL | Status: DC | PRN

## 2019-03-15 MED ORDER — POLYETHYLENE GLYCOL 3350 17 G PO PACK: 17.0000 g | PACK | Freq: Every day | ORAL | Status: DC | PRN

## 2019-03-15 MED ORDER — HYDROCODONE-ACETAMINOPHEN 5-325 MG PO TABS: 1.0000 | ORAL_TABLET | Freq: Four times a day (QID) | ORAL | 0 refills | Status: DC | PRN

## 2019-03-15 MED ORDER — PHENOL 1.4 % MT LIQD: 1.0000 | OROMUCOSAL | Status: DC | PRN

## 2019-03-15 MED ORDER — ACETAMINOPHEN 325 MG PO TABS: 325.0000 mg | ORAL_TABLET | Freq: Four times a day (QID) | ORAL | Status: DC | PRN

## 2019-03-15 SURGICAL SUPPLY — 28 items
BNDG ELASTIC 6X10 VLCR STRL LF (GAUZE/BANDAGES/DRESSINGS) ×3 IMPLANT
BNDG ELASTIC 6X5.8 VLCR STR LF (GAUZE/BANDAGES/DRESSINGS) ×3 IMPLANT
COVER WAND RF STERILE (DRAPES) IMPLANT
DISSECTOR 4.0MM X 13CM (MISCELLANEOUS) ×3 IMPLANT
DRAPE U-SHAPE 47X51 STRL (DRAPES) ×3 IMPLANT
DRSG EMULSION OIL 3X3 NADH (GAUZE/BANDAGES/DRESSINGS) ×3 IMPLANT
DRSG PAD ABDOMINAL 8X10 ST (GAUZE/BANDAGES/DRESSINGS) ×3 IMPLANT
DURAPREP 26ML APPLICATOR (WOUND CARE) ×3 IMPLANT
GAUZE SPONGE 4X4 12PLY STRL (GAUZE/BANDAGES/DRESSINGS) ×3 IMPLANT
GLOVE BIO SURGEON STRL SZ8 (GLOVE) ×3 IMPLANT
GLOVE BIOGEL PI IND STRL 7.0 (GLOVE) ×1 IMPLANT
GLOVE BIOGEL PI IND STRL 8 (GLOVE) ×1 IMPLANT
GLOVE BIOGEL PI INDICATOR 7.0 (GLOVE) ×2
GLOVE BIOGEL PI INDICATOR 8 (GLOVE) ×2
GLOVE SURG SS PI 7.0 STRL IVOR (GLOVE) ×3 IMPLANT
GOWN STRL REUS W/TWL LRG LVL3 (GOWN DISPOSABLE) ×6 IMPLANT
KIT BASIN OR (CUSTOM PROCEDURE TRAY) ×3 IMPLANT
KIT TURNOVER KIT A (KITS) IMPLANT
MANIFOLD NEPTUNE II (INSTRUMENTS) ×3 IMPLANT
PACK ARTHROSCOPY WL (CUSTOM PROCEDURE TRAY) ×3 IMPLANT
PACK ICE MAXI GEL EZY WRAP (MISCELLANEOUS) ×9 IMPLANT
PADDING CAST COTTON 6X4 STRL (CAST SUPPLIES) ×3 IMPLANT
PORT APPOLLO RF 90DEGREE MULTI (SURGICAL WAND) ×3 IMPLANT
PROTECTOR NERVE ULNAR (MISCELLANEOUS) ×3 IMPLANT
SUT ETHILON 4 0 PS 2 18 (SUTURE) ×3 IMPLANT
TOWEL OR 17X26 10 PK STRL BLUE (TOWEL DISPOSABLE) ×3 IMPLANT
TUBING ARTHROSCOPY IRRIG 16FT (MISCELLANEOUS) ×3 IMPLANT
WRAP KNEE MAXI GEL POST OP (GAUZE/BANDAGES/DRESSINGS) ×3 IMPLANT

## 2019-03-15 NOTE — Transfer of Care (Signed)
Immediate Anesthesia Transfer of Care Note  Patient: Pamela Gonzales  Procedure(s) Performed: Right knee arthroscopy; meniscal debridement chondroplasty (Right Knee)  Patient Location: PACU  Anesthesia Type:General  Level of Consciousness: awake, alert , oriented and patient cooperative  Airway & Oxygen Therapy: Patient Spontanous Breathing and Patient connected to face mask oxygen  Post-op Assessment: Report given to RN, Post -op Vital signs reviewed and stable and Patient moving all extremities  Post vital signs: Reviewed and stable  Last Vitals:  Vitals Value Taken Time  BP 167/79 03/15/19 1232  Temp    Pulse 79 03/15/19 1235  Resp 12 03/15/19 1235  SpO2 100 % 03/15/19 1235  Vitals shown include unvalidated device data.  Last Pain:  Vitals:   03/15/19 0959  TempSrc:   PainSc: 0-No pain         Complications: No apparent anesthesia complications

## 2019-03-15 NOTE — Progress Notes (Signed)
Orders clarified with Dr Wynelle Link:  No knee immobilzed needed.  Pt given Incentive spirometer ice wrap and TED hose are on.

## 2019-03-15 NOTE — Interval H&P Note (Signed)
History and Physical Interval Note:  03/15/2019 10:06 AM  Pamela Gonzales  has presented today for surgery, with the diagnosis of right knee medial meniscal tear.  The various methods of treatment have been discussed with the patient and family. After consideration of risks, benefits and other options for treatment, the patient has consented to  Procedure(s) with comments: Right knee arthroscopy; meniscal debridement (Right) - 78min as a surgical intervention.  The patient's history has been reviewed, patient examined, no change in status, stable for surgery.  I have reviewed the patient's chart and labs.  Questions were answered to the patient's satisfaction.     Pilar Plate Gennie Dib

## 2019-03-15 NOTE — Anesthesia Procedure Notes (Signed)
Procedure Name: LMA Insertion Date/Time: 03/15/2019 11:41 AM Performed by: Mitzie Na, CRNA Pre-anesthesia Checklist: Patient identified, Emergency Drugs available, Suction available and Patient being monitored Patient Re-evaluated:Patient Re-evaluated prior to induction Oxygen Delivery Method: Circle system utilized Preoxygenation: Pre-oxygenation with 100% oxygen Induction Type: IV induction LMA: LMA inserted LMA Size: 4.0 Number of attempts: 1 Placement Confirmation: positive ETCO2 and breath sounds checked- equal and bilateral Tube secured with: Tape Dental Injury: Teeth and Oropharynx as per pre-operative assessment

## 2019-03-15 NOTE — Op Note (Signed)
Operative Report- KNEE ARTHROSCOPY  Preoperative diagnosis-  Right knee medial meniscal tear  Postoperative diagnosis Right- knee medial meniscal tear plus Chondral defect medial femoral condyle  Procedure- Right knee arthroscopy with medial  meniscal debridement and chondroplasty   Surgeon- Dione Plover. Natilee Gauer, MD  Anesthesia-General  EBL-  Minimal  Complications- None  Condition- PACU - hemodynamically stable.  Brief clinical note- -Pamela Gonzales is a 86 y.o.  female with a several month history of Right pain and mechanical symptoms. Exam and history suggested medial  meniscal tear confirmed by MRI. The patient presents now for arthroscopy and debridement  Procedure in detail -       After successful administration of General anesthetic, a tourmiquet is placed high on the Right  thigh and the Right lower extremity is prepped and draped in the usual sterile fashion. Time out is performed by the surgical team. Standard superomedial and inferolateral portal sites are marked and incisions made with an 11 blade. The inflow cannula is passed through the superomedial portal and camera through the inferolateral portal and inflow is initiated. Arthroscopic visualization proceeds.      The undersurface of the patella and trochlea are visualized and there is Grade II chondromalacia but no unstable defects. The medial and lateral gutters are visualized and there are  no loose bodies. Flexion and valgus force is applied to the knee and the medial compartment is entered. A spinal needle is passed into the joint through the site marked for the inferomedial portal. A small incision is made and the dilator passed into the joint. The findings for the medial compartment are unstable tear posterior horn and body medial meniscus with Grade III degenerative changes of the medial femoral condyle . The tear is debrided to a stable base with baskets and a shaver and sealed off with the Arthrocare. The shaver is  used to debride the unstable cartilage to a stable cartilaginous base with stable edges. It is probed and found to be stable.    The intercondylar notch is visualized and the ACL appears normal . The lateral compartment is entered and the findings are normal .      The joint is again inspected and there are no other tears, defects or loose bodies identified. The arthroscopic equipment is then removed from the inferior portals which are closed with interrupted 4-0 nylon. 20 ml of .25% Marcaine with epinephrine are injected through the inflow cannula and the cannula is then removed and the portal closed with nylon. The incisions are cleaned and dried and a bulky sterile dressing is applied. The patient is then awakened and transported to recovery in stable condition.   03/15/2019, 12:32 PM

## 2019-03-15 NOTE — Discharge Instructions (Addendum)
Dr. Gaynelle Arabian [facility] [address], [place], [place] [address] [phone]   Arthroscopic Procedure, Knee  PROCEDURE Arthroscopy is a surgical technique that allows your orthopedic surgeon to diagnose and treat your knee injury with accuracy. They will look into your knee through a small instrument. This is almost like a small (pencil sized) telescope. Because arthroscopy affects your knee less than open knee surgery, you can anticipate a more rapid recovery. Taking an active role by following your caregiver's instructions will help with rapid and complete recovery. Use crutches, rest, elevation, ice, and knee exercises as instructed. The length of recovery depends on various factors including type of injury, age, physical condition, medical conditions, and your rehabilitation. Your knee is the joint between the large bones (femur and tibia) in your leg. Cartilage covers these bone ends which are smooth and slippery and allow your knee to bend and move smoothly. Two menisci, thick, semi-lunar shaped pads of cartilage which form a rim inside the joint, help absorb shock and stabilize your knee. Ligaments bind the bones together and support your knee joint. Muscles move the joint, help support your knee, and take stress off the joint itself. Because of this all programs and physical therapy to rehabilitate an injured or repaired knee require rebuilding and strengthening your muscles. AFTER THE PROCEDURE  After the procedure, you will be moved to a recovery area until most of the effects of the medication have worn off. Your caregiver will discuss the test results with you.   Only take over-the-counter or prescription medicines for pain, discomfort, or fever as directed by your caregiver.  SEEK MEDICAL CARE IF:   You have increased bleeding from your wounds.   You see redness, swelling, or have increasing pain in your wounds.   You have pus  coming from your wound.   You have an oral temperature above 102 F (38.9 C).   You notice a bad smell coming from the wound or dressing.   You have severe pain with any motion of your knee.  SEEK IMMEDIATE MEDICAL CARE IF:   You develop a rash.   You have difficulty breathing.   You have any allergic problems.  FURTHER INSTRUCTIONS:   ICE to the affected knee every three hours for 30 minutes at a time and then as needed for pain and swelling.  Continue to use ice on the knee for pain and swelling from surgery. You may notice swelling that will progress down to the foot and ankle.  This is normal after surgery.  Elevate the leg when you are not up walking on it.    DIET You may resume your previous home diet once your are discharged from the hospital.  DRESSING / WOUND CARE / SHOWERING You may change your dressing 2 days after surgery.  Then change the dressing every day with sterile gauze.  Please use good hand washing techniques before changing the dressing.  Do not use any lotions or creams on the incision until instructed by your surgeon. You may start showering two days after being discharged home but do not submerge the incisions under water.  Change dressing 48 hours after the procedure and then cover the small incisions with band aids until your follow up visit. Change the surgical dressings daily and reapply a dry dressing each time.   ACTIVITY Walk with your walker as instructed. Use walker as long as suggested by your caregivers. Avoid periods of inactivity such as  sitting longer than an hour when not asleep. This helps prevent blood clots.  You may resume a sexual relationship in one month or when given the OK by your doctor.  You may return to work once you are cleared by your doctor.  Do not drive a car for 6 weeks or until released by you surgeon.  Do not drive while taking narcotics.  WEIGHT BEARING Weight bearing as tolerated with assist device (walker, cane,  etc) as directed, use it as long as suggested by your surgeon or therapist, typically at least 4-6 weeks.  POSTOPERATIVE CONSTIPATION PROTOCOL Constipation - defined medically as fewer than three stools per week and severe constipation as less than one stool per week.  One of the most common issues patients have following surgery is constipation.  Even if you have a regular bowel pattern at home, your normal regimen is likely to be disrupted due to multiple reasons following surgery.  Combination of anesthesia, postoperative narcotics, change in appetite and fluid intake all can affect your bowels.  In order to avoid complications following surgery, here are some recommendations in order to help you during your recovery period.  Colace (docusate) - Pick up an over-the-counter form of Colace or another stool softener and take twice a day as long as you are requiring postoperative pain medications.  Take with a full glass of water daily.  If you experience loose stools or diarrhea, hold the colace until you stool forms back up.  If your symptoms do not get better within 1 week or if they get worse, check with your doctor.  Dulcolax (bisacodyl) - Pick up over-the-counter and take as directed by the product packaging as needed to assist with the movement of your bowels.  Take with a full glass of water.  Use this product as needed if not relieved by Colace only.   MiraLax (polyethylene glycol) - Pick up over-the-counter to have on hand.  MiraLax is a solution that will increase the amount of water in your bowels to assist with bowel movements.  Take as directed and can mix with a glass of water, juice, soda, coffee, or tea.  Take if you go more than two days without a movement. Do not use MiraLax more than once per day. Call your doctor if you are still constipated or irregular after using this medication for 7 days in a row.  If you continue to have problems with postoperative constipation, please contact  the office for further assistance and recommendations.  If you experience "the worst abdominal pain ever" or develop nausea or vomiting, please contact the office immediatly for further recommendations for treatment.  ITCHING  If you experience itching with your medications, try taking only a single pain pill, or even half a pain pill at a time.  You can also use Benadryl over the counter for itching or also to help with sleep.   TED HOSE STOCKINGS Wear the elastic stockings on both legs for three weeks following surgery during the day but you may remove then at night for sleeping.  MEDICATIONS See your medication summary on the "After Visit Summary" that the nursing staff will review with you prior to discharge.  You may have some home medications which will be placed on hold until you complete the course of blood thinner medication.  It is important for you to complete the blood thinner medication as prescribed by your surgeon.  Continue your approved medications as instructed at time of discharge. Do not  drive while taking narcotics.   PRECAUTIONS If you experience chest pain or shortness of breath - call 911 immediately for transfer to the hospital emergency department.  If you develop a fever greater that 101 F, purulent drainage from wound, increased redness or drainage from wound, foul odor from the wound/dressing, or calf pain - CONTACT YOUR SURGEON.                                                   FOLLOW-UP APPOINTMENTS Make sure you keep all of your appointments after your operation with your surgeon and caregivers. You should call the office at [phone]  and make an appointment for approximately one week after the date of your surgery or on the date instructed by your surgeon outlined in the "After Visit Summary".  RANGE OF MOTION AND STRENGTHENING EXERCISES  Rehabilitation of the knee is important following a knee injury or an operation. After just a few days of immobilization,  the muscles of the thigh which control the knee become weakened and shrink (atrophy). Knee exercises are designed to build up the tone and strength of the thigh muscles and to improve knee motion. Often times heat used for twenty to thirty minutes before working out will loosen up your tissues and help with improving the range of motion but do not use heat for the first two weeks following surgery. These exercises can be done on a training (exercise) mat, on the floor, on a table or on a bed. Use what ever works the best and is most comfortable for you Knee exercises include:  QUAD STRENGTHENING EXERCISES Strengthening Quadriceps Sets  Tighten muscles on top of thigh by pushing knees down into floor or table. Hold for 20 seconds. Repeat 10 times. Do 2 sessions per day.     Strengthening Terminal Knee Extension  With knee bent over bolster, straighten knee by tightening muscle on top of thigh. Be sure to keep bottom of knee on bolster. Hold for 20 seconds. Repeat 10 times. Do 2 sessions per day.   Straight Leg with Bent Knee  Lie on back with opposite leg bent. Keep involved knee slightly bent at knee and raise leg 4-6". Hold for 10 seconds. Repeat 20 times per set. Do 2 sets per session. Do 2 sessions per day.

## 2019-03-15 NOTE — Anesthesia Postprocedure Evaluation (Signed)
Anesthesia Post Note  Patient: Pamela Gonzales  Procedure(s) Performed: Right knee arthroscopy; meniscal debridement chondroplasty (Right Knee)     Patient location during evaluation: PACU Anesthesia Type: General Level of consciousness: awake and alert Pain management: pain level controlled Vital Signs Assessment: post-procedure vital signs reviewed and stable Respiratory status: spontaneous breathing, nonlabored ventilation and respiratory function stable Cardiovascular status: blood pressure returned to baseline and stable Postop Assessment: no apparent nausea or vomiting Anesthetic complications: no    Last Vitals:  Vitals:   03/15/19 1247 03/15/19 1300  BP: (!) 166/73 (!) 170/81  Pulse:  78  Resp:  15  Temp:  (!) 36.2 C  SpO2:  100%    Last Pain:  Vitals:   03/15/19 1300  TempSrc:   PainSc: 0-No pain                 Audry Pili

## 2019-04-12 ENCOUNTER — Other Ambulatory Visit: Payer: Self-pay | Admitting: Internal Medicine

## 2019-05-10 ENCOUNTER — Telehealth: Payer: Self-pay | Admitting: Internal Medicine

## 2019-05-10 NOTE — Telephone Encounter (Signed)
Patient notes that she is currently having some post op issues with her knee and has an appointment scheduled with her surgeon tomorrow. She is currently not having any change in GI symptoms. Wants to put off GI visit for now. She indicates she will follow up with [place] at a later time. Office visit cancelled for now.

## 2019-05-10 NOTE — Telephone Encounter (Signed)
Hi Dottie, pt called asking to speak with you about her appt scheduled for tomorrow. Pls call her.

## 2019-05-11 ENCOUNTER — Ambulatory Visit: Payer: Medicare PPO | Admitting: Internal Medicine

## 2019-05-31 ENCOUNTER — Telehealth: Payer: Self-pay | Admitting: Internal Medicine

## 2019-05-31 NOTE — Telephone Encounter (Signed)
Appointment made

## 2019-05-31 NOTE — Telephone Encounter (Signed)
Pt requested to schedule a 98-month FU with Dr. Hilarie Fredrickson.  She declined the first-available appt offered for 08/04/19.  She requested to speak to you as "you work magic sometimes."

## 2019-06-23 ENCOUNTER — Other Ambulatory Visit (INDEPENDENT_AMBULATORY_CARE_PROVIDER_SITE_OTHER): Payer: Medicare PPO

## 2019-06-23 ENCOUNTER — Ambulatory Visit: Payer: Medicare PPO | Admitting: Internal Medicine

## 2019-06-23 VITALS — BP 130/72 | HR 80 | Ht 64.0 in | Wt 199.0 lb

## 2019-06-23 DIAGNOSIS — K449 Diaphragmatic hernia without obstruction or gangrene: Secondary | ICD-10-CM | POA: Diagnosis not present

## 2019-06-23 DIAGNOSIS — K219 Gastro-esophageal reflux disease without esophagitis: Secondary | ICD-10-CM | POA: Diagnosis not present

## 2019-06-23 DIAGNOSIS — R0781 Pleurodynia: Secondary | ICD-10-CM

## 2019-06-23 DIAGNOSIS — K5900 Constipation, unspecified: Secondary | ICD-10-CM | POA: Diagnosis not present

## 2019-06-23 DIAGNOSIS — D500 Iron deficiency anemia secondary to blood loss (chronic): Secondary | ICD-10-CM

## 2019-06-23 LAB — CBC WITH DIFFERENTIAL/PLATELET
Basophils Absolute: 0.1 10*3/uL (ref 0.0–0.1)
Basophils Relative: 0.8 % (ref 0.0–3.0)
Eosinophils Absolute: 0.2 10*3/uL (ref 0.0–0.7)
Eosinophils Relative: 2.8 % (ref 0.0–5.0)
HCT: 40.8 % (ref 36.0–46.0)
Hemoglobin: 14 g/dL (ref 12.0–15.0)
Lymphocytes Relative: 18 % (ref 12.0–46.0)
Lymphs Abs: 1.5 10*3/uL (ref 0.7–4.0)
MCHC: 34.3 g/dL (ref 30.0–36.0)
MCV: 95.3 fl (ref 78.0–100.0)
Monocytes Absolute: 1 10*3/uL (ref 0.1–1.0)
Monocytes Relative: 11.4 % (ref 3.0–12.0)
Neutro Abs: 5.7 10*3/uL (ref 1.4–7.7)
Neutrophils Relative %: 67 % (ref 43.0–77.0)
Platelets: 184 10*3/uL (ref 150.0–400.0)
RBC: 4.28 Mil/uL (ref 3.87–5.11)
RDW: 13.5 % (ref 11.5–15.5)
WBC: 8.4 10*3/uL (ref 4.0–10.5)

## 2019-06-23 LAB — COMPREHENSIVE METABOLIC PANEL
ALT: 14 U/L (ref 0–35)
AST: 20 U/L (ref 0–37)
Albumin: 4.1 g/dL (ref 3.5–5.2)
Alkaline Phosphatase: 46 U/L (ref 39–117)
BUN: 18 mg/dL (ref 6–23)
CO2: 27 mEq/L (ref 19–32)
Calcium: 9.5 mg/dL (ref 8.4–10.5)
Chloride: 104 mEq/L (ref 96–112)
Creatinine, Ser: 0.75 mg/dL (ref 0.40–1.20)
GFR: 73.12 mL/min (ref >60.00)
Glucose, Bld: 86 mg/dL (ref 70–99)
Potassium: 4.2 mEq/L (ref 3.5–5.1)
Sodium: 138 mEq/L (ref 135–145)
Total Bilirubin: 0.9 mg/dL (ref 0.2–1.2)
Total Protein: 6.8 g/dL (ref 6.0–8.3)

## 2019-06-23 LAB — IBC + FERRITIN
Ferritin: 15.2 ng/mL (ref 10.0–291.0)
Iron: 95 ug/dL (ref 42–145)
Saturation Ratios: 23.1 % (ref 20.0–50.0)
Transferrin: 294 mg/dL (ref 212.0–360.0)

## 2019-06-23 NOTE — Progress Notes (Signed)
Subjective:    Patient ID: Pamela Gonzales, female    DOB: May 25, 1930, 86 y.o.   MRN: [id number]  HPI Pamela Gonzales is an 86 year old female with a past medical history of GERD with complex hiatal hernia and history of Cameron's erosions, history of IDA felt secondary to hiatal hernia, history of gastritis, history of diverticulosis, chronic constipation, history of hemorrhoids, remote colon polyps who is here for follow-up.  She is here alone today and I last saw her on 01/19/2019.  She reports that she has been doing fairly well.  She is found that her bowel movements have been somewhat further apart and more infrequent.  She still feels that her overall colon function is "lazy".  She can go as long as 3 days without a bowel movement despite eating fruit.  She occasionally will use Colace which seems to help at times.  She does have a fullness in her lower abdomen which resolves after a bowel movement.  She has considered increasing prunes and dates in her diet.  No blood in stool or melena.  Occasionally she will feel an epigastric discomfort but not true pain.  Heartburn is well controlled.  No nausea or vomiting.  No early satiety.  Appetite is good.  She remains active doing water aerobics most days a week.  She did lean across her car console to reach her cane recently and felt acute pain in her right anterior lower rib cage.  This remains sore.  She just completed gel injections in her right knee for meniscal injury and this has helped.   Review of Systems As per HPI, otherwise negative  Current Medications, Allergies, Past Medical History, Past Surgical History, Family History and Social History were reviewed in [employer] record.     Objective:   Physical Exam Pulse 80   Ht 5\' 4"  (1.626 m)   Wt 199 lb (90.3 kg)   BMI 34.16 kg/m  Gen: awake, alert, NAD HEENT: anicteric CV: RRR, no mrg Pulm: CTA b/l Abd: soft, NT/ND, +BS throughout Ext: no c/c/e Neuro:  nonfocal     Assessment & Plan:  86 year old female with a past medical history of GERD with complex hiatal hernia and history of Cameron's erosions, history of IDA felt secondary to hiatal hernia, history of gastritis, history of diverticulosis, chronic constipation, history of hemorrhoids, remote colon polyps who is here for follow-up.   1.  Constipation/dyssynergic defecation --  her infrequent bowel movements can be defined as constipation and this is leading to abdominal fullness and perhaps the upper abdominal pressure discomfort she occasionally feels.  I recommended that she try prune juice or schedule Colace on a daily basis.  We could add MiraLAX if this is not effective.  She will try either or both of these treatments and let me know if not improving  2.  GERD with hiatal hernia --doing well symptomatically.  Her upper abdominal discomfort may relate to hiatal hernia which we discussed today.  I would rather treat constipation first before considering increasing her pantoprazole --Continue pantoprazole 40 mg daily  3.  IDA --history of due to Cameron's lesions.  Check blood counts and iron studies today  4.  Rib pain --likely strain or even minor fracture caused by trauma on the right.  Reassurance provided as this will heal by itself without specific treatment.  If it does not she should notify Dr. Harrington Challenger.  56-month follow-up per patient request  30 minutes total  spent today including patient facing time, coordination of care, reviewing medical history/procedures/pertinent radiology studies, and documentation of the encounter.

## 2019-06-23 NOTE — Patient Instructions (Addendum)
Use prune juice as needed for constipation or you may use colace 100 mg every evening (over the counter).  Continue pantoprazole.  Continue your multivitamin.  Continue your high fiber diet.  Please follow up with Dr Hilarie Fredrickson on Wednesday, 09/22/19 at 9:10 am.  If you are age 84 or older, your body mass index should be between 23-30. Your Body mass index is 34.16 kg/m. If this is out of the aforementioned range listed, please consider follow up with your Primary Care Provider.  If you are age 33 or younger, your body mass index should be between 19-25. Your Body mass index is 34.16 kg/m. If this is out of the aformentioned range listed, please consider follow up with your Primary Care Provider.   Due to recent changes in healthcare laws, you may see the results of your imaging and laboratory studies on MyChart before your provider has had a chance to review them.  We understand that in some cases there may be results that are confusing or concerning to you. Not all laboratory results come back in the same time frame and the provider may be waiting for multiple results in order to interpret others.  Please give [place] 48 hours in order for your provider to thoroughly review all the results before contacting the office for clarification of your results.

## 2019-07-20 LAB — LIPID PANEL
Chol/HDL Ratio: 2.7 ratio (ref 0.0–4.4)
Cholesterol, Total: 166 mg/dL (ref 100–199)
HDL: 62 mg/dL (ref >39)
LDL Chol Calc (NIH): 83 mg/dL (ref 0–99)
Triglycerides: 118 mg/dL (ref 0–149)
VLDL Cholesterol Cal: 21 mg/dL (ref 5–40)

## 2019-07-20 LAB — BASIC METABOLIC PANEL
BUN/Creatinine Ratio: 17 (ref 12–28)
BUN: 12 mg/dL (ref 8–27)
CO2: 24 mmol/L (ref 20–29)
Calcium: 9.5 mg/dL (ref 8.7–10.3)
Chloride: 105 mmol/L (ref 96–106)
Creatinine, Ser: 0.72 mg/dL (ref 0.57–1.00)
GFR calc Af Amer: 88 mL/min/{1.73_m2} (ref >59)
GFR calc non Af Amer: 76 mL/min/{1.73_m2} (ref >59)
Glucose: 95 mg/dL (ref 65–99)
Potassium: 4.7 mmol/L (ref 3.5–5.2)
Sodium: 141 mmol/L (ref 134–144)

## 2019-07-20 LAB — HEPATIC FUNCTION PANEL
ALT: 15 IU/L (ref 0–32)
AST: 24 IU/L (ref 0–40)
Albumin: 4.2 g/dL (ref 3.6–4.6)
Alkaline Phosphatase: 62 IU/L (ref 48–121)
Bilirubin Total: 0.7 mg/dL (ref 0.0–1.2)
Bilirubin, Direct: 0.21 mg/dL (ref 0.00–0.40)
Total Protein: 6.5 g/dL (ref 6.0–8.5)

## 2019-07-23 NOTE — Progress Notes (Signed)
Pamela Gonzales Date of Birth: [birth year] Medical Record #[id number]  History of Present Illness: Pamela Gonzales is seen back today for follow up of CAD. She has a history of HLD, Lupus, GERD, OA, RBBB and CAD with past stents placed in 2005. She had stenting of the mid LAD and 2nd OM in 2005 with Cypher stents. Last Myoview was in June 2017 and was normal. She has remained on Plavix.   She did have surgery for a torn meniscus in her knee in February. Has since then had gel injections x 3 and fluid removed.  She is followed by Dr Pamela Gonzales for GERD, constipation and iron deficiency anemia felt to be partly due to [facility] lesions/hiatal hernia. Iron deficiency corrected with infusions.    On follow up today she is doing well from a cardiac standpoint.   She still  does water aerobics at least 3x/week. She has  neuropathy in her feet that limits her activity.  She denies any chest pain or SOB. She brings a diary of her BP readings and other that a few elevations her control appears good.   Current Outpatient Medications  Medication Sig Dispense Refill  . acetaminophen (TYLENOL) 325 MG tablet Take 650 mg by mouth every 6 (six) hours as needed.    Marland Kitchen amLODipine (NORVASC) 5 MG tablet Take 1 tablet (5 mg total) by mouth daily. 90 tablet 3  . aspirin 81 MG tablet Take 81 mg by mouth daily.      . clopidogrel (PLAVIX) 75 MG tablet Take 1 tablet (75 mg total) by mouth daily. 90 tablet 3  . dextromethorphan-guaiFENesin (MUCINEX DM) 30-600 MG 12hr tablet Take 2 tablets by mouth daily.    . Lidocaine, Anorectal, (RECTICARE) 5 % CREA Place 1 application rectally daily as needed (after bowel movements).     . loratadine (CLARITIN) 10 MG tablet Take 10 mg by mouth daily.    Marland Kitchen losartan (COZAAR) 100 MG tablet TAKE 1 TABLET BY MOUTH EVERY DAY (Patient taking differently: Take 100 mg by mouth daily. ) 90 tablet 2  . Multiple Vitamins-Minerals (MULTIVITAMIN WITH MINERALS) tablet Take 1 tablet by mouth daily.    Marland Kitchen  MYRBETRIQ 50 MG TB24 tablet Take 50 mg by mouth daily.   2  . NASONEX 50 MCG/ACT nasal spray 2 sprays by Nasal route Daily.    . nitroGLYCERIN (NITROSTAT) 0.4 MG SL tablet Place 1 tablet (0.4 mg total) under the tongue every 5 (five) minutes as needed for chest pain. 25 tablet 11  . pantoprazole (PROTONIX) 40 MG tablet TAKE 1 TABLET BY MOUTH EVERY DAY 90 tablet 1  . rosuvastatin (CRESTOR) 40 MG tablet Take 40 mg by mouth daily.     No current facility-administered medications for this visit.    Allergies  Allergen Reactions  . Cephalexin   . Lisinopril Cough  . Oxycodone Itching  . Oxycodone-Acetaminophen Itching    Patient states she is not allergic to tylenol.  Takes several times a day.  Marland Kitchen Penicillins   . Sulfa Drugs Cross Reactors Other (See Comments)    Skin irritation.  . Tramadol     Past Medical History:  Diagnosis Date  . Anemia   . Arthritis   . Asthmatic bronchitis   . Benign fundic gland polyps of stomach   . CAD (coronary artery disease) 05   stents  . Diverticulosis   . Esophageal dysmotility   . Fibula fracture    right  . Gallstones   . Gastric  erosions   . GERD (gastroesophageal reflux disease)   . Hiatal hernia   . Hypercholesteremia   . Hypertension   . Internal hemorrhoids   . Joint stiffness   . Lupus ([place])   . Osteoarthritis   . Osteoporosis   . Paresthesia   . Peripheral neuropathy   . Peripheral neuropathy   . RBBB   . Seasonal allergies   . Spinal stenosis   . Tubular adenoma of colon     Past Surgical History:  Procedure Laterality Date  . ABDOMINAL HYSTERECTOMY  1975  . BREAST EXCISIONAL BIOPSY Right   . CATARACT EXTRACTION, BILATERAL    . CHOLECYSTECTOMY  07/04/2011   Procedure: LAPAROSCOPIC CHOLECYSTECTOMY WITH INTRAOPERATIVE CHOLANGIOGRAM;  Surgeon: Merrie Roof, MD;  Location: [facility];  Service: General;  Laterality: N/A;  . COLONOSCOPY W/ BIOPSIES    . CORONARY ANGIOPLASTY  2005  . ESOPHAGOGASTRODUODENOSCOPY (EGD) WITH  PROPOFOL N/A 06/11/2016   Procedure: ESOPHAGOGASTRODUODENOSCOPY (EGD) WITH PROPOFOL;  Surgeon: Garlan Fair, MD;  Location: [facility];  Service: Endoscopy;  Laterality: N/A;  . FIBULA FRACTURE SURGERY  2011   Right Ankle  . FLEXIBLE SIGMOIDOSCOPY N/A 07/09/2016   Procedure: FLEXIBLE SIGMOIDOSCOPY;  Surgeon: Garlan Fair, MD;  Location: [facility];  Service: Endoscopy;  Laterality: N/A;  . KNEE ARTHROSCOPY Right 03/15/2019   Procedure: Right knee arthroscopy; meniscal debridement chondroplasty;  Surgeon: Gaynelle Arabian, MD;  Location: [facility];  Service: Orthopedics;  Laterality: Right;  40min    Social History   Tobacco Use  Smoking Status Never Smoker  Smokeless Tobacco Never Used    Social History   Substance and Sexual Activity  Alcohol Use Yes   Comment: very rare    Family History  Problem Relation Age of Onset  . Lung cancer Father   . Heart attack Mother   . Heart attack Brother   . Heart disease Brother   . Rectal cancer Brother        only 1 kidney  . Cancer Sister   . Pancreatic cancer Sister   . Heart disease Sister   . Anesthesia problems Neg Hx   . Hypotension Neg Hx   . Malignant hyperthermia Neg Hx   . Pseudochol deficiency Neg Hx   . Breast cancer Neg Hx   . Colon cancer Neg Hx   . Stomach cancer Neg Hx     Review of Systems: The review of systems is per the HPI.  All other systems were reviewed and are negative.  Physical Exam: BP 130/72 (BP Location: Left Arm, Patient Position: Sitting, Cuff Size: Large)   Pulse 83   Ht 5\' 4"  (1.626 m)   Wt 201 lb (91.2 kg)   SpO2 96%   BMI 34.50 kg/m  GENERAL:  Well appearing, obese WF in NAD HEENT:  PERRL, EOMI, sclera are clear. Oropharynx is clear. NECK:  No jugular venous distention, carotid upstroke brisk and symmetric, no bruits, no thyromegaly or adenopathy LUNGS:  Clear to auscultation bilaterally CHEST:  Unremarkable HEART:  RRR,  PMI not displaced or sustained,S1 and S2 within normal  limits, no S3, no S4: no clicks, no rubs, no murmurs EXT:  2 + pulses throughout, no edema, no cyanosis no clubbing SKIN:  Warm and dry.  No rashes NEURO:  Alert and oriented x 3. Cranial nerves II through XII intact. PSYCH:  Cognitively intact  LABORATORY DATA:  Lab Results  Component Value Date   WBC 8.4 06/23/2019   HGB 14.0  06/23/2019   HCT 40.8 06/23/2019   PLT 184.0 06/23/2019   GLUCOSE 95 07/20/2019   CHOL 166 07/20/2019   TRIG 118 07/20/2019   HDL 62 07/20/2019   LDLCALC 83 07/20/2019   ALT 15 07/20/2019   AST 24 07/20/2019   NA 141 07/20/2019   K 4.7 07/20/2019   CL 105 07/20/2019   CREATININE 0.72 07/20/2019   BUN 12 07/20/2019   CO2 24 07/20/2019   INR 1.07 08/03/2009   HGBA1C 5.4 05/10/2010    Myoview study 07/26/15:Study Highlights    The left ventricular ejection fraction is hyperdynamic (>65%).  Nuclear stress EF: 72%.  There was no ST segment deviation noted during stress.  The study is normal.  This is a low risk study.   This is a normal pharmacologic nuclear study with no evidence of prior infarct or ischemia. Normal LVEF.       Assessment / Plan: 1. CAD - s/p stenting of the LAD and OM in 2005 with first generation Cypher stents. Normal Myoview in June 2017.  She is asymptomatic. Since she has first generation DES we have continued  DAPT indefinitely.   2. HTN - BP control is satisfactory. Home BP readings reviewed.   3. HLD - on statin. LDL 83. Goal 70. On maximal dose of Crestor. Per prior discussion she would like to avoid additional medical therapy.  4. Chronic RBBB since 2014.    5. Peripheral neuropathy  6. Iron deficiency anemia. Improved.   I will follow up in 6 months

## 2019-07-26 ENCOUNTER — Ambulatory Visit: Payer: Medicare PPO | Admitting: Cardiology

## 2019-07-26 ENCOUNTER — Encounter: Payer: Self-pay | Admitting: Cardiology

## 2019-07-26 ENCOUNTER — Other Ambulatory Visit: Payer: Self-pay

## 2019-07-26 VITALS — BP 130/72 | HR 83 | Ht 64.0 in | Wt 201.0 lb

## 2019-07-26 DIAGNOSIS — I2510 Atherosclerotic heart disease of native coronary artery without angina pectoris: Secondary | ICD-10-CM

## 2019-07-26 DIAGNOSIS — I4510 Unspecified right bundle-branch block: Secondary | ICD-10-CM | POA: Diagnosis not present

## 2019-07-26 DIAGNOSIS — E7800 Pure hypercholesterolemia, unspecified: Secondary | ICD-10-CM | POA: Diagnosis not present

## 2019-07-26 DIAGNOSIS — I10 Essential (primary) hypertension: Secondary | ICD-10-CM

## 2019-08-23 ENCOUNTER — Other Ambulatory Visit: Payer: Self-pay | Admitting: Internal Medicine

## 2019-08-23 ENCOUNTER — Other Ambulatory Visit: Payer: Self-pay | Admitting: Cardiology

## 2019-09-16 ENCOUNTER — Other Ambulatory Visit: Payer: Self-pay | Admitting: Chiropractic Medicine

## 2019-09-16 DIAGNOSIS — M5416 Radiculopathy, lumbar region: Secondary | ICD-10-CM

## 2019-09-16 DIAGNOSIS — M25561 Pain in right knee: Secondary | ICD-10-CM | POA: Diagnosis not present

## 2019-09-16 DIAGNOSIS — G629 Polyneuropathy, unspecified: Secondary | ICD-10-CM | POA: Diagnosis not present

## 2019-09-20 ENCOUNTER — Telehealth: Payer: Self-pay | Admitting: *Deleted

## 2019-09-20 NOTE — Telephone Encounter (Signed)
Left message to call back  Kerin Ransom PA-C 09/20/2019 2:11 PM

## 2019-09-20 NOTE — Telephone Encounter (Addendum)
° ° °  PRIMARY CARDIOLOGIST: DR PETER Martinique   [facility] Pre-operative Risk Assessment      Request for surgical clearance:  1. What type of surgery is being performed? LUMBAR NRB  2. When is this surgery scheduled? TBD  3. What type of clearance is required (medical clearance vs. Pharmacy clearance to hold med vs. Both)? MEDICAL  4. Are there any medications that need to be held prior to surgery and how long?PLAVIX FOR 5 DAYS PRIOR   5. Practice name and name of physician performing surgery? [facility]   6. What is the office phone number?  [phone]   7.   What is the office fax number? [name] [name]  8.   Anesthesia type (None, local, MAC, general) ? UNKNOWN   Pamela Gonzales 09/20/2019, 12:10 PM  _________________________________________________________________   (provider comments below)

## 2019-09-21 NOTE — Telephone Encounter (Signed)
Pamela Gonzales is returning Pamela Gonzales's call. Pamela Gonzales states she will be available for the next 20 minutes, if she does not answer she states a detailed message can be left. Please advise.

## 2019-09-21 NOTE — Telephone Encounter (Signed)
   Primary Cardiologist: Dr Martinique  Chart reviewed and patient contacted by phone today as part of pre-operative protocol coverage. Given past medical history and time since last visit, based on ACC/AHA guidelines, Pamela Gonzales would be at acceptable risk for the planned procedure without further cardiovascular testing.   Based on previous recommendations OK to hold Plavix 5 days pre op.  We would prefer the patient remain on aspirin if possible but if needed this can be held 3-5 days pre op. Resume both as soon as safe post op.   I will route this recommendation to the requesting party via Epic fax function and remove from pre-op pool.  Please call with questions.  Kerin Ransom, PA-C 09/21/2019, 8:13 AM

## 2019-09-22 ENCOUNTER — Ambulatory Visit: Payer: Medicare PPO | Admitting: Internal Medicine

## 2019-09-22 ENCOUNTER — Encounter: Payer: Self-pay | Admitting: Internal Medicine

## 2019-09-22 VITALS — BP 124/68 | HR 78 | Ht 64.0 in | Wt 201.0 lb

## 2019-09-22 DIAGNOSIS — D500 Iron deficiency anemia secondary to blood loss (chronic): Secondary | ICD-10-CM | POA: Diagnosis not present

## 2019-09-22 DIAGNOSIS — K219 Gastro-esophageal reflux disease without esophagitis: Secondary | ICD-10-CM | POA: Diagnosis not present

## 2019-09-22 DIAGNOSIS — K5900 Constipation, unspecified: Secondary | ICD-10-CM

## 2019-09-22 DIAGNOSIS — K449 Diaphragmatic hernia without obstruction or gangrene: Secondary | ICD-10-CM

## 2019-09-22 NOTE — Progress Notes (Signed)
Subjective:    Patient ID: Pamela Gonzales, female    DOB: 1930/12/26, 86 y.o.   MRN: [id number]  HPI Pamela Gonzales is an 86 year old female with a history of GERD with complex hiatal hernia and history of Cameron's erosions, history of IDA felt secondary to hiatal hernia, history of gastritis, chronic constipation, diverticulosis and remote colon polyps who is here for follow-up.  She is here alone today and I last saw her on 06/23/2019.  She reports she is been doing fairly well.  She still has been dealing with mild constipation from a "lazy rectum".  She was using Colace 100 mg and had excellent result but this seems to have lost some of its efficacy despite fruits and vegetables.  She read the bottle instructions and would like to try to increase the dose.  No new abdominal pain.  Reflux remains well controlled on pantoprazole 40 mg daily.  Energy levels are good.  Her rib pain which she had last time has healed completely.  She remains very active working out with [employer] and also water aerobics.   Review of Systems As per HPI, otherwise negative  Current Medications, Allergies, Past Medical History, Past Surgical History, Family History and Social History were reviewed in [employer] record.     Objective:   Physical Exam BP 124/68   Pulse 78   Ht 5\' 4"  (1.626 m)   Wt 201 lb (91.2 kg)   BMI 34.50 kg/m  Gen: awake, alert, NAD HEENT: anicteric CV: RRR, no mrg Pulm: CTA b/l Abd: soft, NT/ND, +BS throughout Ext: no c/c/e Neuro: nonfocal  CBC    Component Value Date/Time   WBC 8.4 06/23/2019 1042   RBC 4.28 06/23/2019 1042   HGB 14.0 06/23/2019 1042   HGB 13.4 06/24/2018 0827   HCT 40.8 06/23/2019 1042   HCT 40.5 06/24/2018 0827   PLT 184.0 06/23/2019 1042   PLT 204 06/24/2018 0827   MCV 95.3 06/23/2019 1042   MCV 96 06/24/2018 0827   MCH 32.3 03/10/2019 1133   MCHC 34.3 06/23/2019 1042   RDW 13.5 06/23/2019 1042   RDW 13.1 06/24/2018  0827   LYMPHSABS 1.5 06/23/2019 1042   LYMPHSABS 1.2 06/24/2018 0827   MONOABS 1.0 06/23/2019 1042   EOSABS 0.2 06/23/2019 1042   EOSABS 0.6 (H) 06/24/2018 0827   BASOSABS 0.1 06/23/2019 1042   BASOSABS 0.1 06/24/2018 0827   Iron/TIBC/Ferritin/ %Sat    Component Value Date/Time   IRON 95 06/23/2019 1042   FERRITIN 15.2 06/23/2019 1042   IRONPCTSAT 23.1 06/23/2019 1042   CMP     Component Value Date/Time   NA 141 07/20/2019 0811   K 4.7 07/20/2019 0811   CL 105 07/20/2019 0811   CO2 24 07/20/2019 0811   GLUCOSE 95 07/20/2019 0811   GLUCOSE 86 06/23/2019 1042   BUN 12 07/20/2019 0811   CREATININE 0.72 07/20/2019 0811   CREATININE 0.89 (H) 07/08/2016 0838   CALCIUM 9.5 07/20/2019 0811   PROT 6.5 07/20/2019 0811   ALBUMIN 4.2 07/20/2019 0811   AST 24 07/20/2019 0811   ALT 15 07/20/2019 0811   ALKPHOS 62 07/20/2019 0811   BILITOT 0.7 07/20/2019 0811   GFRNONAA 76 07/20/2019 0811   GFRAA 88 07/20/2019 0811         Assessment & Plan:  87 year old female with a history of GERD with complex hiatal hernia and history of Cameron's erosions, history of IDA felt secondary to hiatal hernia, history of  gastritis, chronic constipation, diverticulosis and remote colon polyps who is here for follow-up.   1.  Mild constipation/dyssynergy defecation --Colace was helping but seemed to lose efficacy, certainly okay to increase dose. --Increase Colace to 100 mg twice daily, could increase to 100 mg 3 times daily if needed --MiraLAX would be the next option if Colace ineffective at higher doses  2.  GERD with hiatal hernia --doing well symptomatically.  No recent upper abdominal pain.  Continue PPI --Continue pantoprazole 40 mg daily  3.  IDA --history of due to Cameron's lesions.  She did receive IV iron in the remote past which has helped her blood counts and iron studies nicely.  Hemoglobin and iron studies normal at last office visit  62-month follow-up, sooner if needed  20  minutes total spent today including patient facing time, coordination of care, reviewing medical history/procedures/pertinent radiology studies, and documentation of the encounter.

## 2019-09-22 NOTE — Patient Instructions (Signed)
Please purchase the following medications over the counter and take as directed: Colace (1-2 times daily--may increase to 3 times daily if needed)  Continue pantoprazole.  Please follow up with Dr Hilarie Fredrickson in 3 months.  If you are age 84 or older, your body mass index should be between 23-30. Your Body mass index is 34.5 kg/m. If this is out of the aforementioned range listed, please consider follow up with your Primary Care Provider.  If you are age 60 or younger, your body mass index should be between 19-25. Your Body mass index is 34.5 kg/m. If this is out of the aformentioned range listed, please consider follow up with your Primary Care Provider.   Due to recent changes in healthcare laws, you may see the results of your imaging and laboratory studies on MyChart before your provider has had a chance to review them.  We understand that in some cases there may be results that are confusing or concerning to you. Not all laboratory results come back in the same time frame and the provider may be waiting for multiple results in order to interpret others.  Please give [place] 48 hours in order for your provider to thoroughly review all the results before contacting the office for clarification of your results.

## 2019-09-28 ENCOUNTER — Ambulatory Visit
Admission: RE | Admit: 2019-09-28 | Discharge: 2019-09-28 | Disposition: A | Discharge status: Home / Self Care | Payer: Medicare PPO | Source: Ambulatory Visit | Attending: Chiropractic Medicine | Admitting: Chiropractic Medicine

## 2019-09-28 ENCOUNTER — Other Ambulatory Visit: Payer: Self-pay

## 2019-09-28 DIAGNOSIS — M48061 Spinal stenosis, lumbar region without neurogenic claudication: Secondary | ICD-10-CM | POA: Diagnosis not present

## 2019-09-28 DIAGNOSIS — M5416 Radiculopathy, lumbar region: Secondary | ICD-10-CM

## 2019-09-28 MED ORDER — METHYLPREDNISOLONE ACETATE 40 MG/ML INJ SUSP (RADIOLOG
120.0000 mg | Freq: Once | INTRAMUSCULAR | Status: AC
  Administered 2019-09-28: 120 mg via EPIDURAL

## 2019-09-28 MED ORDER — IOPAMIDOL (ISOVUE-M 200) INJECTION 41%
1.0000 mL | Freq: Once | INTRAMUSCULAR | Status: AC
  Administered 2019-09-28: 1 mL via EPIDURAL

## 2019-09-28 NOTE — Discharge Instructions (Signed)

## 2019-10-06 ENCOUNTER — Other Ambulatory Visit: Payer: Self-pay | Admitting: Internal Medicine

## 2019-10-14 ENCOUNTER — Other Ambulatory Visit: Payer: Self-pay

## 2019-10-14 DIAGNOSIS — Z961 Presence of intraocular lens: Secondary | ICD-10-CM | POA: Diagnosis not present

## 2019-10-14 DIAGNOSIS — H5212 Myopia, left eye: Secondary | ICD-10-CM | POA: Diagnosis not present

## 2019-10-15 ENCOUNTER — Ambulatory Visit: Payer: Medicare PPO | Admitting: Neurology

## 2019-10-15 ENCOUNTER — Encounter: Payer: Self-pay | Admitting: Neurology

## 2019-10-15 VITALS — BP 138/73 | HR 79 | Ht 64.0 in | Wt 198.0 lb

## 2019-10-15 DIAGNOSIS — R202 Paresthesia of skin: Secondary | ICD-10-CM | POA: Diagnosis not present

## 2019-10-15 DIAGNOSIS — M5417 Radiculopathy, lumbosacral region: Secondary | ICD-10-CM

## 2019-10-15 DIAGNOSIS — M48062 Spinal stenosis, lumbar region with neurogenic claudication: Secondary | ICD-10-CM | POA: Diagnosis not present

## 2019-10-15 DIAGNOSIS — R200 Anesthesia of skin: Secondary | ICD-10-CM

## 2019-10-15 DIAGNOSIS — G6289 Other specified polyneuropathies: Secondary | ICD-10-CM

## 2019-10-15 NOTE — Patient Instructions (Addendum)
You have multiple types of neuropathy:  1. Radiculopathy(pinched nerves) causing the left foot numbness,  2. 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  Peripheral Neuropathy Peripheral neuropathy is a type of nerve damage. It affects nerves that carry signals between the spinal cord and the arms, legs, and the rest of the body (peripheral nerves). It does not affect nerves in the spinal cord or brain. In peripheral neuropathy, one nerve or a group of nerves may be damaged. Peripheral neuropathy is a broad category that includes many specific nerve disorders, like diabetic neuropathy, hereditary neuropathy, and carpal tunnel syndrome. What are the causes? This condition may be caused by:  Diabetes. This is the most common cause of peripheral neuropathy.  Nerve injury.  Pressure or stress on a nerve that lasts a long time.  Lack (deficiency) of B vitamins. This can result from alcoholism, poor diet, or a restricted diet.  Infections.  Autoimmune diseases, such as rheumatoid arthritis and systemic lupus erythematosus.  Nerve diseases that are passed from parent to child (inherited).  Some medicines, such as cancer medicines (chemotherapy).  Poisonous (toxic) substances, such as lead and mercury.  Too little blood flowing to the legs.  Kidney disease.  Thyroid disease. In some cases, the cause of this condition is not known. What are the signs or symptoms? Symptoms of this condition depend on which of your nerves is damaged. Common symptoms include:  Loss of feeling (numbness) in the feet, hands, or both.  Tingling in the feet, hands, or both.  Burning pain.  Very sensitive skin.  Weakness.  Not being able to move a part of the body  (paralysis).  Muscle twitching.  Clumsiness or poor coordination.  Loss of balance.  Not being able to control your bladder.  Feeling dizzy.  Sexual problems. How is this diagnosed? Diagnosing and finding the cause of peripheral neuropathy can be difficult. Your health care provider will take your medical history and do a physical exam. A neurological exam will also be done. This involves checking things that are affected by your brain, spinal cord, and nerves (nervous system). For example, your health care provider will check your reflexes, how you move, and what you can feel. You may have other tests, such as:  Blood tests.  Electromyogram (EMG) and nerve conduction tests. These tests check nerve function and how well the nerves are controlling the muscles.  Imaging tests, such as CT scans or MRI to rule out other causes of your symptoms.  Removing a small piece of nerve to be examined in a lab (nerve biopsy). This is rare.  Removing and examining a small amount of the fluid that surrounds the brain and spinal cord (lumbar puncture). This is rare. How is this treated? Treatment for this condition may involve:  Treating the underlying cause of the neuropathy, such as diabetes, kidney disease, or vitamin deficiencies.  Stopping medicines that can cause neuropathy, such as chemotherapy.  Medicine to relieve pain. Medicines may include: ? Prescription or over-the-counter pain medicine. ? Antiseizure medicine. ? Antidepressants. ? Pain-relieving patches that are applied to painful areas of skin.  Surgery to relieve pressure on a nerve or to destroy a nerve that is causing pain.  Physical therapy to help improve movement and balance.  Devices to help you move  around (assistive devices). Follow these instructions at home: Medicines  Take over-the-counter and prescription medicines only as told by your health care provider. Do not take any other medicines without first asking  your health care provider.  Do not drive or use heavy machinery while taking prescription pain medicine. Lifestyle   Do not use any products that contain nicotine or tobacco, such as cigarettes and e-cigarettes. Smoking keeps blood from reaching damaged nerves. If you need help quitting, ask your health care provider.  Avoid or limit alcohol. Too much alcohol can cause a vitamin B deficiency, and vitamin B is needed for healthy nerves.  Eat a healthy diet. This includes: ? Eating foods that are high in fiber, such as fresh fruits and vegetables, whole grains, and beans. ? Limiting foods that are high in fat and processed sugars, such as fried or sweet foods. General instructions   If you have diabetes, work closely with your health care provider to keep your blood sugar under control.  If you have numbness in your feet: ? Check every day for signs of injury or infection. Watch for redness, warmth, and swelling. ? Wear padded socks and comfortable shoes. These help protect your feet.  Develop a good support system. Living with peripheral neuropathy can be stressful. Consider talking with a mental health specialist or joining a support group.  Use assistive devices and attend physical therapy as told by your health care provider. This may include using a walker or a cane.  Keep all follow-up visits as told by your health care provider. This is important. Contact a health care provider if:  You have new signs or symptoms of peripheral neuropathy.  You are struggling emotionally from dealing with peripheral neuropathy.  Your pain is not well-controlled. Get help right away if:  You have an injury or infection that is not healing normally.  You develop new weakness in an arm or leg.  You fall frequently. Summary  Peripheral neuropathy is when the nerves in the arms, or legs are damaged, resulting in numbness, weakness, or pain.  There are many causes of peripheral neuropathy,  including diabetes, pinched nerves, vitamin deficiencies, autoimmune disease, and hereditary conditions.  Diagnosing and finding the cause of peripheral neuropathy can be difficult. Your health care provider will take your medical history, do a physical exam, and do tests, including blood tests and nerve function tests.  Treatment involves treating the underlying cause of the neuropathy and taking medicines to help control pain. Physical therapy and assistive devices may also help. This information is not intended to replace advice given to you by your health care provider. Make sure you discuss any questions you have with your health care provider. Document Revised: 12/27/2016 Document Reviewed: 03/25/2016 Elsevier Patient Education  [place].   Spinal Stenosis  Spinal stenosis happens when the open space (spinal canal) between the bones of your spine (vertebrae) gets smaller. It is caused by bone pushing into the open spaces of your backbone (spine). This puts pressure on your backbone and the nerves in your backbone. Treatment often focuses on managing any pain and symptoms. In some cases, surgery may be needed. Follow these instructions at home: Managing pain, stiffness, and swelling   Do all exercises and stretches as told by your doctor.  Stand and sit up straight (use good posture). If you were given a brace or a corset, wear it as told by your doctor.  Do not do any activities that cause pain. Ask your  doctor what activities are safe for you.  Do not lift anything that is heavier than 10 lb (4.5 kg) or heavier than your doctor tells you.  Try to stay at a healthy weight. Talk with your doctor if you need help losing weight.  If directed, put heat on the affected area as often as told by your doctor. Use the heat source that your doctor recommends, such as a moist heat pack or a heating pad. ? Put a towel between your skin and the heat source. ? Leave the heat on for  20-30 minutes. ? Remove the heat if your skin turns bright red. This is especially important if you are not able to feel pain, heat, or cold. You may have a greater risk of getting burned. General instructions  Take over-the-counter and prescription medicines only as told by your doctor.  Do not use any products that contain nicotine or tobacco, such as cigarettes and e-cigarettes. If you need help quitting, ask your doctor.  Eat a healthy diet. This includes plenty of fruits and vegetables, whole grains, and low-fat (lean) protein.  Keep all follow-up visits as told by your doctor. This is important. Contact a doctor if:  Your symptoms do not get better.  Your symptoms get worse.  You have a fever. Get help right away if:  You have new or worse pain in your neck or upper back.  You have very bad pain that medicine does not control.  You are dizzy.  You have vision problems, blurred vision, or double vision.  You have a very bad headache that is worse when you stand.  You feel sick to your stomach (nauseous).  You throw up (vomit).  You have new or worse numbness or tingling in your back or legs.  You have pain, redness, swelling, or warmth in your arm or leg. Summary  Spinal stenosis happens when the open space (spinal canal) between the bones of your spine gets smaller (narrow).  Contact a doctor if your symptoms get worse.  In some cases, surgery may be needed. This information is not intended to replace advice given to you by your health care provider. Make sure you discuss any questions you have with your health care provider. Document Revised: 12/27/2016 Document Reviewed: 12/20/2015 Elsevier Patient Education  [place].   Radicular Pain Radicular pain is a type of pain that spreads from your back or neck along a spinal nerve. Spinal nerves are nerves that leave the spinal cord and go to the muscles. Radicular pain is sometimes called radiculopathy,  radiculitis, or a pinched nerve. When you have this type of pain, you may also have weakness, numbness, or tingling in the area of your body that is supplied by the nerve. The pain may feel sharp and burning. Depending on which spinal nerve is affected, the pain may occur in the:  Neck area (cervical radicular pain). You may also feel pain, numbness, weakness, or tingling in the arms.  Mid-spine area (thoracic radicular pain). You would feel this pain in the back and chest. This type is rare.  Lower back area (lumbar radicular pain). You would feel this pain as low back pain. You may feel pain, numbness, weakness, or tingling in the buttocks or legs. Sciatica is a type of lumbar radicular pain that shoots down the back of the leg. Radicular pain occurs when one of the spinal nerves becomes irritated or squeezed (compressed). It is often caused by something pushing on a spinal  nerve, such as one of the bones of the spine (vertebrae) or one of the round cushions between vertebrae (intervertebral disks). This can result from:  An injury.  Wear and tear or aging of a disk.  The growth of a bone spur that pushes on the nerve. Radicular pain often goes away when you follow instructions from your health care provider for relieving pain at home. Follow these instructions at home: Managing pain      If directed, put ice on the affected area: ? Put ice in a plastic bag. ? Place a towel between your skin and the bag. ? Leave the ice on for 20 minutes, 2-3 times a day.  If directed, apply heat to the affected area as often as told by your health care provider. Use the heat source that your health care provider recommends, such as a moist heat pack or a heating pad. ? Place a towel between your skin and the heat source. ? Leave the heat on for 20-30 minutes. ? Remove the heat if your skin turns bright red. This is especially important if you are unable to feel pain, heat, or cold. You may have a  greater risk of getting burned. Activity   Do not sit or rest in bed for long periods of time.  Try to stay as active as possible. Ask your health care provider what type of exercise or activity is best for you.  Avoid activities that make your pain worse, such as bending and lifting.  Do not lift anything that is heavier than 10 lb (4.5 kg), or the limit that you are told, until your health care provider says that it is safe.  Practice using proper technique when lifting items. Proper lifting technique involves bending your knees and rising up.  Do strength and range-of-motion exercises only as told by your health care provider or physical therapist. General instructions  Take over-the-counter and prescription medicines only as told by your health care provider.  Pay attention to any changes in your symptoms.  Keep all follow-up visits as told by your health care provider. This is important. ? Your health care provider may send you to a physical therapist to help with this pain. Contact a health care provider if:  Your pain and other symptoms get worse.  Your pain medicine is not helping.  Your pain has not improved after a few weeks of home care.  You have a fever. Get help right away if:  You have severe pain, weakness, or numbness.  You have difficulty with bladder or bowel control. Summary  Radicular pain is a type of pain that spreads from your back or neck along a spinal nerve.  When you have radicular pain, you may also have weakness, numbness, or tingling in the area of your body that is supplied by the nerve.  The pain may feel sharp or burning.  Radicular pain may be treated with ice, heat, medicines, or physical therapy. This information is not intended to replace advice given to you by your health care provider. Make sure you discuss any questions you have with your health care provider. Document Revised: 07/29/2017 Document Reviewed: 07/29/2017 Elsevier  Patient Education  [place].

## 2019-10-15 NOTE — Progress Notes (Addendum)
[facility]    Provider:  Dr Jaynee Eagles Requesting Provider: Lawerance Cruel, MD Primary Care Provider:  Lawerance Cruel, MD  CC:  Neuropathy  HPI:  Pamela Gonzales is a 86 y.o. female here as requested by Lawerance Cruel, MD for " please evaluate/provide second opinion for care/treatment.  Also?  Sciatica of lumbar radiculopathy."She has a past medical history of coronary artery disease, hypertension, joint stiffness, arthritis, constipation, sinusitis, reactive airway dysfunction syndrome, sciatica, functional constipation, wheezing, peripheral neuropathy, balance problems, daytime somnolence.  I reviewed Dr. Alan Ripper notes: patient seen [facility]: Patient is an 86 year old right-handed white female who she seen on and off since January 2012 (this is a note from Dr. Sherwood Gambler at [facility] neurosurgery and spine).  She continues to have difficulties with her low back, lower extremities, she continues to have occasional falls, she is followed by Dr. Melinda Crutch is her primary physician and Dr. Peter Martinique as her cardiologist.  She reports falls and increased low back discomfort, multiple falls for in a year, she does find that her left lower extremity can give way in standing, she occasionally loses control of her left foot, she describes increased numbness and tingling in the left foot and ankle, particularly over the past couple months, overall she feels her symptoms are worsening.  She uses extra Tylenol as needed.  She exercises regularly doing stretching and flexibility exercises, weight resistance in the gym and water aerobics, she has osteoporosis and does water aerobics.  Examination showed normal rate rhythm, no murmur, no tenderness to palpation over the lumbar spinous processes or paralumbar muscular, she can flex to 90 degrees but limited to extension, straight leg raise was negative bilaterally, neurologically she is 5 out of 5 strength, sensation  intact to pinprick in the distal extremities, some decreased vibratory sense in the distal lower extremities, reflexes are 2 at the quads, absent in the gastroc, toes are downgoing, gait shows a slightly flexed posture.  She has stenosis particularly at the at the L4-L5 level, which may be worse with the increasing spondylolisthesis seen on MRI.  Recent lumbar x-rays were repeated and compared to her x-rays of May 2017 and the MRI was done in January 2012: She has significant facet arthropathy in the lower spine area, and a grade 2 spondylolisthesis on L4-L5 and a grade 2 spondylolisthesis at L5-S1.  In comparison to the x-rays done 20 months ago, the L5-S1 spondylolisthesis is unchanged but the L4-L5 spondylolisthesis has increased.  They are relatively static through flexion and extension there is disc space narrowing at the L5-S1 level.   Impression from Dr. Sherwood Gambler, note review:  so the patient has some falls, she says her lower extremities can give way, on exam she has intact strength, may be some mild distal peripheral neuropathy, it is unclear with contributing to this patient's falls, possible peripheral neuropathy not being fully aware of where her feet are rather than weakness, which we do not find an examination.  They asked for another MRI as the last one was done 7 years ago however he did not think surgical intervention was indicated despite the progressive degeneration the surgery would be quite extensive and certainly she would need to be off Plavix for at least 3 weeks perioperatively.  They did repeat the MRI.  Of note patient was seeing Dr. Posey Pronto who is a neuromuscular specialist and neurology and was last seen about a year ago. I also reviewed these notes: Vitamin B12 and  TSH levels were checked which were normal.  MRI of the lumbar spine showed severe lumbar canal stenosis at L4-L5 and severe by foraminal stenosis at L5-S1.  She had an EMG nerve conduction study which confirms the presence  of a sensorimotor polyneuropathy as well as bilateral S1 radiculopathy.  No signs of progression.  Patient is here alone ans she reports she has not had many falls. She is here because of her neuropathy.  She did not feel going to other doctors in the past was very helpful, however now she is at [facility] and feels as though this is helpful.  She reports she was diagnosed with peripheral neuropathy by Dr. Posey Pronto, she also understand she has significant low back issues, her business concern is left foot numbness, continuous, she has not had many falls. She has peripheral neuropathy and wants to know what that means and what she can do about it. She feels like she is getting the right treatment for her back. She is really happy with Dr. Nelva Bush' team.  However her symptoms have progressed over the years and she is very concerned, she thinks she needs another opinion on her peripheral neuropathy. She has not had a fall in 2 years. Diagnosed with peripheral neuropathy by Dr. Posey Pronto as well as other physicians.  She went to see Dr. Posey Pronto about her neck. The left foot is the primary problem started getting "thick" in 2018.  Progressively worsened.  At that time she did have a few falls. Primarily the left foot, the entire foot, The whole foot. When she stands on solid floor, or hard woods, her left foot becomes immobile and goes up into the leg to her knee however she cannot say and what pattern,. She feels like she lost use of it. Right foot is not an issue. Worse with standing. Also worse with walking on pavement. A shot helped with the sciatic pain but now with the numbness in the foot.  She denies any other weakness.  No significant problems with the other foot.  We had extensive extended visit today please see assessment and plan.  Reviewed notes, labs and imaging from outside physicians, which showed:  08/2017: b1 normal, no monoclonal protein detected    reviewed images and agree also reviewed images with  patient in the office together   MRI Lumbar Spine 2012: L1-2: Mild disc bulge without central canal or foraminal stenosis.   L2-3: Facet arthropathy is noted. No disc bulge or protrusion. Central canal and foramina are open.   L3-4: Mild disc bulge with some ligamentum flavum thickening and facet arthropathy. Central canal is mildly narrowed. Foramina are also mildly narrowed.   L4-5: The patient has new small bilateral facet joint effusions. Ligamentum flavum is thickened and there is a broad-based disc bulge causing moderate to moderately severe central canal stenosis with narrowing of both lateral recesses. Moderate bilateral foraminal narrowing is identified. Foraminal narrowing has progressed.   L5-S1: Severe facet degenerative change is seen and unchanged. Ligamentum flavum thickening is also again noted. The disc is uncovered with a bulge. Moderate central canal stenosis is present. Anterolisthesis results in bilateral foraminal narrowing which appears unchanged.   IMPRESSION:   1. New small bilateral facet joint effusions at L4-5. Disc bulging and ligamentum flavum thickening at this level result in moderate to moderately severe central canal stenosis without marked change. Foraminal narrowing has progressed at this level. 2. No change in mild anterolisthesis of L5 on S1. Bilateral foraminal narrowing and moderate central  canal stenosis at this level appear unchanged. 3. Mild central canal narrowing L3-4 due to disc bulging and mild ligamentum flavum thickening.  MRI lumbar spine 01/2017: Degenerative changes at L1-2 and L2-3 levels, mild disc bulbing and canal stenosis at L3-4 level. Significant degeneration at L4-5 and L5-S1, progressed from 2012. At L4-5 level, there is severe hypertrophic facet arthropathy, ligamentum flavum thickening, circumfrential disc bulbing, and marked to severe multifactorial canal stenosis. At L5-S1, there is circumfrential disc bulbing,  hypertrophic facet atrhropathy, ligamentum flavum thickening, and mild canal but marked to severe lateral recess stenosis bilaterally at L5-S1 level. Labs 04/28/2017: TSH 1.89, vitamin B12 1140   EMG/NCS: 08/26/2017:  [place], [place] [address] Tel: [phone] Fax:  [phone] Test Date:  08/26/2017  Patient: Pamela Gonzales DOB: [birth year] Physician: Narda Amber, DO  Sex: Female Height: 5\' 4"  Ref Phys: Narda Amber, DO  ID#: [id number] Temp: 34.9C Technician:    Patient Complaints: This is an 86 year old female referred for evaluation of bilateral feet numbness and imbalance.  NCV & EMG Findings: Extensive electrodiagnostic testing of the right lower extremity and additional studies of the left shows:  1. Bilateral sural and superficial peroneal sensory responses are absent. 2. Bilateral peroneal motor responses are within normal limits. Bilateral tibial motor responses show reduced amplitude (R2.0, L3.6 mV).   3. Bilateral tibial H reflex studies shows prolonged latencies.  4. Chronic motor axon loss changes are seen affecting the muscles below the knee and biceps femoris short head muscles bilaterally.  There is no evidence of active denervation.   Impression: 1. The electrophysiologic findings are most consistent with a chronic and symmetric sensorimotor axonal polyneuropathy affecting the lower extremities. 2. There is a superimposed chronic S1 radiculopathy affecting bilateral lower extremities, mild in degree electrically.   ___________________________ Narda Amber, DO    Nerve Conduction Studies Anti Sensory Summary Table   Site NR Peak (ms) Norm Peak (ms) P-T Amp (V) Norm P-T Amp  Left Sup Peroneal Anti Sensory (Ant Lat Mall)  34.9C  12 cm NR  <4.6  >3  Right Sup Peroneal Anti Sensory (Ant Lat Mall)  34.9C  12 cm NR  <4.6  >3  Left Sural Anti Sensory (Lat Mall)  34.9C  Calf NR  <4.6  >3  Right Sural Anti Sensory (Lat Mall)  34.9C  Calf NR  <4.6   >3   Motor Summary Table   Site NR Onset (ms) Norm Onset (ms) O-P Amp (mV) Norm O-P Amp Site1 Site2 Delta-0 (ms) Dist (cm) Vel (m/s) Norm Vel (m/s)  Left Peroneal Motor (Ext Dig Brev)  34.9C  Ankle    3.5 <6.0 2.7 >2.5 B Fib Ankle 8.0 34.0 43 >40  B Fib    11.5  2.4  Poplt B Fib 1.3 8.0 62 >40  Poplt    12.8  2.1         Right Peroneal Motor (Ext Dig Brev)  34.9C  Ankle    3.4 <6.0 2.8 >2.5 B Fib Ankle 7.1 36.0 51 >40  B Fib    10.5  2.4  Poplt B Fib 1.0 8.0 80 >40  Poplt    11.5  2.4         Left Tibial Motor (Abd Hall Brev)  34.9C  Ankle    3.7 <6.0 3.6 >4 Knee Ankle 8.3 38.0 46 >40  Knee    12.0  2.1         Right Tibial Motor (Abd Hall Brev)  34.9C  Ankle  3.4 <6.0 2.0 >4 Knee Ankle 8.9 44.0 49 >40  Knee    12.3  0.8          H Reflex Studies   NR H-Lat (ms) Lat Norm (ms) L-R H-Lat (ms)  Left Tibial (Gastroc)  34.9C     37.28 <35 0.00  Right Tibial (Gastroc)  34.9C     37.28 <35 0.00   EMG   Side Muscle Ins Act Fibs Psw Fasc Number Recrt Dur Dur. Amp Amp. Poly Poly. Comment  Right AntTibialis Nml Nml Nml Nml 1- Rapid Some 1+ Some 1+ Some 1+ N/A  Right Gastroc Nml Nml Nml Nml 1- Rapid Some 1+ Some 1+ Some 1+ N/A  Right Flex Dig Long Nml Nml Nml Nml 1- Rapid Some 1+ Some 1+ Some 1+ N/A  Right RectFemoris Nml Nml Nml Nml Nml Nml Nml Nml Nml Nml Nml Nml N/A  Right GluteusMed Nml Nml Nml Nml Nml Nml Nml Nml Nml Nml Nml Nml N/A  Right BicepsFemS Nml Nml Nml Nml 1- Rapid Some 1+ Some 1+ Some 1+ N/A  Left AntTibialis Nml Nml Nml Nml 1- Rapid Some 1+ Some 1+ Some 1+ N/A  Left Gastroc Nml Nml Nml Nml 1- Rapid Some 1+ Some 1+ Some 1+ N/A  Left Flex Dig Long Nml Nml Nml Nml 1- Rapid Some 1+ Some 1+ Some 1+ N/A  Left RectFemoris Nml Nml Nml Nml Nml Nml Nml Nml Nml Nml Nml Nml N/A  Left GluteusMed Nml Nml Nml Nml Nml Nml Nml Nml Nml Nml Nml Nml N/A  Left BicepsFemS Nml Nml Nml Nml 1- Rapid Some 1+ Some 1+ Some 1+ N/A       Waveforms:                   Imaging  Imaging Information  Exam Information  Status Exam Begun  Exam Ended   Final [99]      Order-Level Documents:  There are no order-level documents. Encounter-Level Documents - 08/26/2017:  Electronic signature on 08/26/2017 10:08 AM - E-signed     NCV with EMG(electromyography) Order: [id number] Status:  Final result Visible to patient:  No (seen, blocked) Dx:  Neuropathy; Unsteady gait; Sensory at...  1 Result Note   1 Follow-up Encounter Details  Narrative Alda Berthold, DO   08/26/2017 12:45 PM  [facility]  [place], [address]  [place], [place] [address]  Tel: [phone]  Fax: [phone]  Test Date: 08/26/2017   Patient: Pamela Gonzales DOB: 1930/10/19 Physician: Narda Amber, DO  Sex: Female Height: 5\' 4"  Ref Phys: Narda Amber, DO  ID#: [id number] Temp: 34.9C Technician:    Patient Complaints:  This is an 86 year old female referred for evaluation of  bilateral feet numbness and imbalance.   NCV & EMG Findings:  Extensive electrodiagnostic testing of the right lower extremity  and additional studies of the left shows:  1. Bilateral sural and superficial peroneal sensory responses are  absent.  2. Bilateral peroneal motor responses are within normal limits.  Bilateral tibial motor responses show reduced amplitude (R2.0,  L3.6 mV).   3. Bilateral tibial H reflex studies shows prolonged latencies.  4. Chronic motor axon loss changes are seen affecting the muscles  below the knee and biceps femoris short head muscles bilaterally.  There is no evidence of active denervation.   Impression:  1. The electrophysiologic findings are most consistent with a  chronic and symmetric sensorimotor axonal polyneuropathy  affecting the lower extremities.  2. There is a superimposed chronic S1 radiculopathy affecting  bilateral lower extremities, mild in degree electrically.     ___________________________  Narda Amber, DO     Nerve Conduction Studies  Anti Sensory Summary Table   Site NR Peak (ms) Norm Peak (ms) P-T Amp (V) Norm P-T Amp  Left Sup Peroneal Anti Sensory (Ant Lat Mall) 34.9C  12 cm NR <4.6 >3  Right Sup Peroneal Anti Sensory (Ant Lat Mall) 34.9C  12 cm NR <4.6 >3  Left Sural Anti Sensory (Lat Mall) 34.9C  Calf NR <4.6 >3  Right Sural Anti Sensory (Lat Mall) 34.9C  Calf NR <4.6 >3   Motor Summary Table   Site NR Onset (ms) Norm Onset (ms) O-P Amp (mV) Norm O-P Amp  Site1 Site2 Delta-0 (ms) Dist (cm) Vel (m/s) Norm Vel (m/s)  Left Peroneal Motor (Ext Dig Brev) 34.9C  Ankle  3.5 <6.0 2.7 >2.5 B Fib Ankle 8.0 34.0 43 >40  B Fib  11.5 2.4 Poplt B Fib 1.3 8.0 62 >40  Poplt  12.8 2.1      Right Peroneal Motor (Ext Dig Brev) 34.9C  Ankle  3.4 <6.0 2.8 >2.5 B Fib Ankle 7.1 36.0 51 >40  B Fib  10.5 2.4 Poplt B Fib 1.0 8.0 80 >40  Poplt  11.5 2.4      Left Tibial Motor (Abd Hall Brev) 34.9C  Ankle  3.7 <6.0 3.6 >4 Knee Ankle 8.3 38.0 46 >40  Knee  12.0 2.1      Right Tibial Motor (Abd Hall Brev) 34.9C  Ankle  3.4 <6.0 2.0 >4 Knee Ankle 8.9 44.0 49 >40  Knee  12.3 0.8       H Reflex Studies   NR H-Lat (ms) Lat Norm (ms) L-R H-Lat (ms)  Left Tibial (Gastroc) 34.9C   37.28 <35 0.00  Right Tibial (Gastroc) 34.9C   37.28 <35 0.00   EMG   Side Muscle Ins Act Fibs Psw Fasc Number Recrt Dur Dur. Amp Amp.  Poly Poly. Comment  Right AntTibialis Nml Nml Nml Nml 1- Rapid Some 1+ Some 1+ Some  1+ N/A  Right Gastroc Nml Nml Nml Nml 1- Rapid Some 1+ Some 1+ Some 1+  N/A  Right Flex Dig Long Nml Nml Nml Nml 1- Rapid Some 1+ Some 1+ Some  1+ N/A  Right RectFemoris Nml Nml Nml Nml Nml Nml Nml Nml Nml Nml Nml Nml  N/A  Right GluteusMed Nml Nml Nml Nml Nml Nml Nml Nml Nml Nml Nml Nml  N/A  Right BicepsFemS Nml Nml Nml Nml 1- Rapid Some 1+ Some 1+ Some 1+  N/A   Left AntTibialis Nml Nml Nml Nml 1- Rapid Some 1+ Some 1+ Some 1+  N/A  Left Gastroc Nml Nml Nml Nml 1- Rapid Some 1+ Some 1+ Some 1+ N/A   Left Flex Dig Long Nml Nml Nml Nml 1- Rapid Some 1+ Some 1+ Some  1+ N/A  Left RectFemoris Nml Nml Nml Nml Nml Nml Nml Nml Nml Nml Nml Nml  N/A  Left GluteusMed Nml Nml Nml Nml Nml Nml Nml Nml Nml Nml Nml Nml  N/A  Left BicepsFemS Nml Nml Nml Nml 1- Rapid Some 1+ Some 1+ Some 1+  N/A          Review of Systems: Patient complains of symptoms per HPI as well as the following symptoms: numbness, tingling, low back pain, remote falls. Pertinent negatives and positives per HPI. All others negative.  Social History   Socioeconomic History  . Marital status: Divorced    Spouse name: Not on file  . Number of children: 2  . Years of education: 50  . Highest education level: Master's degree (e.g., MA, MS, MEng, MEd, MSW, MBA)  Occupational History  . Occupation: Conservation officer, nature: RETIRED    Comment: retired  Tobacco Use  . Smoking status: Never Smoker  . Smokeless tobacco: Never Used  Vaping Use  . Vaping Use: Never used  Substance and Sexual Activity  . Alcohol use: Yes    Comment: very rare  . Drug use: No  . Sexual activity: Not Currently  Other Topics Concern  . Not on file  Social History Narrative   Lives alone.  She has two 2 sons.  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   One story apartment   Right handed   Social Determinants of Health   Financial Resource Strain:   . Difficulty of Paying Living Expenses: Not on file  Food Insecurity:   . Worried About Charity fundraiser in the Last Year: Not on file  . Ran Out of Food in the Last Year: Not on file  Transportation Needs:   . Lack of Transportation (Medical): Not on file  . Lack of Transportation (Non-Medical): Not on file  Physical Activity:   . Days of Exercise per Week: Not on file  . Minutes of Exercise per Session:  Not on file  Stress:   . Feeling of Stress : Not on file  Social Connections:   . Frequency of Communication with Friends and Family: Not on file  . Frequency of Social Gatherings with Friends and Family: Not on file  . Attends Religious Services: Not on file  . Active Member of Clubs or Organizations: Not on file  . Attends Archivist Meetings: Not on file  . Marital Status: Not on file  Intimate Partner Violence:   . Fear of Current or Ex-Partner: Not on file  . Emotionally Abused: Not on file  . Physically Abused: Not on file  . Sexually Abused: Not on file    Family History  Problem Relation Age of Onset  . Lung cancer Father   . Heart attack Mother   . Heart attack Brother   . Heart disease Brother   . Rectal cancer Brother        only 1 kidney  . Cancer Sister   . Pancreatic cancer Sister   . Heart disease Sister   . Anesthesia problems Neg Hx   . Hypotension Neg Hx   . Malignant hyperthermia Neg Hx   . Pseudochol deficiency Neg Hx   . Breast cancer Neg Hx   . Colon cancer Neg Hx   . Stomach cancer Neg Hx     Past Medical History:  Diagnosis Date  . Anemia   . Arthritis   . Asthmatic bronchitis   . Atherosclerotic heart disease of native coronary artery without angina pectoris   . Benign fundic gland polyps of stomach   . CAD (coronary artery disease) 05   stents  . Daytime somnolence   . Diverticulosis   . Esophageal dysmotility   . Fibula fracture    right  . Gallstones   . Gastric erosions   . GERD (gastroesophageal reflux disease)   . Hiatal hernia   . Hypercholesteremia   . Hypertension   . Internal hemorrhoids   . Joint stiffness   .  Lupus ([place])   . Osteoarthritis   . Osteoporosis   . Paresthesia   . Peripheral neuropathy   . Peripheral neuropathy   . RBBB   . Reactive airways dysfunction syndrome ([place])   . Sciatica   . Seasonal allergies   . Spinal stenosis   . Tubular adenoma of colon     Patient Active Problem List    Diagnosis Date Noted  . Lumbar stenosis with neurogenic claudication 10/16/2019  . Lumbosacral radiculopathy 10/16/2019  . Numbness and tingling of foot 10/16/2019  . Peripheral neuropathy 10/16/2019  . Acute meniscal tear, medial, right, subsequent encounter 03/14/2019  . Osteoporosis   . Osteoarthritis   . Internal hemorrhoids   . Hiatal hernia   . Gastric erosions   . Gallstones   . Fibula fracture   . Esophageal dysmotility   . Diverticulosis   . Benign fundic gland polyps of stomach   . Asthmatic bronchitis   . Arthritis   . Asthma 06/30/2014  . Benign essential HTN 12/09/2013  . RBBB   . Cholelithiasis 05/28/2011  . Obesity 11/15/2010  . Elevated BP 05/01/2010  . CAD (coronary artery disease)   . Hypercholesteremia   . Lupus ([place])   . GERD (gastroesophageal reflux disease)   . Spinal stenosis     Past Surgical History:  Procedure Laterality Date  . ABDOMINAL HYSTERECTOMY  1975  . BREAST EXCISIONAL BIOPSY Right   . CATARACT EXTRACTION, BILATERAL    . CHOLECYSTECTOMY  07/04/2011   Procedure: LAPAROSCOPIC CHOLECYSTECTOMY WITH INTRAOPERATIVE CHOLANGIOGRAM;  Surgeon: Merrie Roof, MD;  Location: [facility];  Service: General;  Laterality: N/A;  . COLONOSCOPY W/ BIOPSIES    . CORONARY ANGIOPLASTY  2005  . ESOPHAGOGASTRODUODENOSCOPY (EGD) WITH PROPOFOL N/A 06/11/2016   Procedure: ESOPHAGOGASTRODUODENOSCOPY (EGD) WITH PROPOFOL;  Surgeon: Garlan Fair, MD;  Location: [facility];  Service: Endoscopy;  Laterality: N/A;  . FIBULA FRACTURE SURGERY  2011   Right Ankle  . FLEXIBLE SIGMOIDOSCOPY N/A 07/09/2016   Procedure: FLEXIBLE SIGMOIDOSCOPY;  Surgeon: Garlan Fair, MD;  Location: [facility];  Service: Endoscopy;  Laterality: N/A;  . KNEE ARTHROSCOPY Right 03/15/2019   Procedure: Right knee arthroscopy; meniscal debridement chondroplasty;  Surgeon: Gaynelle Arabian, MD;  Location: [facility];  Service: Orthopedics;  Laterality: Right;  77min    Current Outpatient  Medications  Medication Sig Dispense Refill  . acetaminophen (TYLENOL) 325 MG tablet Take 650 mg by mouth every 6 (six) hours as needed.    Marland Kitchen amLODipine (NORVASC) 5 MG tablet TAKE 1 TABLET BY MOUTH EVERY DAY 90 tablet 3  . aspirin 81 MG tablet Take 81 mg by mouth daily.      . clopidogrel (PLAVIX) 75 MG tablet Take 1 tablet (75 mg total) by mouth daily. 90 tablet 3  . Coenzyme Q10 (CO Q 10 PO) Take 200 mg by mouth daily.    Marland Kitchen dextromethorphan-guaiFENesin (MUCINEX DM) 30-600 MG 12hr tablet Take 2 tablets by mouth daily.    Marland Kitchen losartan (COZAAR) 100 MG tablet TAKE 1 TABLET BY MOUTH EVERY DAY 90 tablet 3  . Multiple Vitamins-Minerals (MULTIVITAMIN WITH MINERALS) tablet Take 1 tablet by mouth daily.    Marland Kitchen MYRBETRIQ 50 MG TB24 tablet Take 50 mg by mouth daily.   2  . NASONEX 50 MCG/ACT nasal spray 2 sprays by Nasal route Daily.    . rosuvastatin (CRESTOR) 40 MG tablet Take 40 mg by mouth daily.     No current facility-administered medications for this visit.  Allergies as of 10/15/2019 - Review Complete 10/15/2019  Allergen Reaction Noted  . Cephalexin  03/06/2010  . Penicillins  03/06/2010  . Tramadol  10/11/2016  . Lisinopril Cough 01/11/2015  . Oxycodone Itching 12/14/2012  . Sulfa drugs cross reactors Other (See Comments) 03/06/2010    Vitals: BP 138/73   Pulse 79   Ht 5\' 4"  (1.626 m)   Wt 198 lb (89.8 kg)   BMI 33.99 kg/m  Last Weight:  Wt Readings from Last 1 Encounters:  10/15/19 198 lb (89.8 kg)   Last Height:   Ht Readings from Last 1 Encounters:  10/15/19 5\' 4"  (1.626 m)     Physical exam: Exam: Gen: NAD, conversant, well nourised, obese, well groomed                     CV: RRR, no MRG. No Carotid Bruits. No peripheral edema, warm, nontender Eyes: Conjunctivae clear without exudates or hemorrhage  Neuro: Detailed Neurologic Exam  Speech:    Speech is normal; fluent and spontaneous with normal comprehension.  Cognition:    The patient is oriented to person,  place, and time;     recent and remote memory intact;     language fluent;     normal attention, concentration,     fund of knowledge Cranial Nerves:    The pupils are equal, round, and reactive to light.  Attempted funduscopy could not visualize due to small pupils visual fields are full to finger confrontation. Extraocular movements are intact. Trigeminal sensation is intact and the muscles of mastication are normal. The face is symmetric. The palate elevates in the midline. Hearing intact. Voice is normal. Shoulder shrug is normal. The tongue has normal motion without fasciculations.   Coordination:    Normal finger to nose and heel to shin. Normal rapid alternating movements.   Gait:    Antalgic with a cane  Motor Observation:    No asymmetry, no atrophy, and no involuntary movements noted. Tone:    Normal muscle tone.    Posture:    Posture is normal. normal erect    Strength:    Strength is V/V in the upper and lower limbs.      Sensation: decreased pp distally mild, intact vibration      Reflex Exam:  DTR's:  Trace right AJ, absent left AJ  2+ patellars and biceos    Deep tendon reflexes in the upper and lower extremities are normal bilaterally.   Toes:    The toes are downgoing bilaterally.   Clonus:    Clonus is absent.    Assessment/Plan:  86 y.o. female here as requested by Lawerance Cruel, MD for " please evaluate/provide second opinion for care/treatment.  She has a past medical history of coronary artery disease, hypertension, joint stiffness, arthritis, constipation, sinusitis, reactive airway dysfunction syndrome, Severe lumbar stenosis, sciatica, constipation, wheezing, distal peripheral neuropathy, balance problems, daytime somnolence. Patient has already been evaluated by Dr. Chevis Pretty and has already had neurology evaluation and EMG nerve conduction study by Dr. Posey Pronto at [facility] neurology who is a neuromuscular specialist and is currently managed at  [facility].  Her evaluation showed L4-L5 severe multifactorial canal stenosis, bilateral radiculopathy with S1 radiculopathy confirmed on EMG nerve conduction study, and concomitant peripheral polyneuropathy stable.  Thyroid was normal, B12 is normal, in the past extensive lab testing ofr causes of peripheral neuropathy negative.   - Nothing more to add to patient's work-up, she has been already  evaluated by NSY(Dr. Sherwood Gambler), Neurology(Dr. Posey Pronto) and is now a patient at [facility]. The symptoms she is most symptomatic for are most likely from her severe canal stenosis and radiculopathy; she does have concomitant peripheral distal stable polyneuropathy(EMG/NCS completed by Dr. Posey Pronto) but I do not believe this is significantly contributing to her main problems(see below).  At this time neurology has no further recommendations. I was able to have a long conversation with patient and hopefully she has a better understanding of her particular clinical picture.   -Left foot numbness: Likely from low back issues; patient has severe lumbar stenosis as well as radiculopathy.  I spent at least 80 minutes of office time trying to explain the difference between spinal stenosis, lumbar radiculopathy, and peripheral polyneuropathy in relationship to her particular clinical presentation.  I do feel that the left foot numbness(right not affected, left foot numbness with slightly reduced AJ on the left) is coming from her lumbar stenosis and radiculopathy and I brought up images on the Internet of the lumbosacral dermatomes in order to show her how her spine and nerves innervate the foot. I tried to explain the difference between peripheral polyneuropathy and radiculopathy as far as her clinical presentation goes.  Patient did understand what spinal stenosis and lumbar radiculopathy are but again we had a long conversation about how it applies to her particular clinical presentation.  -In addition to her severe  lumbosacral issues, Patient has concomitant peripheral distal polyneuropathy at this time idiopathic but she was on plaqueneil for years and diagnosed with patient that plaquenil and autoimmune disorders, both can cause peripheral neuropath.  I do not think that this is significant and may even be incidental, she does have some decreased pinprick distally in the feet in a gradient pattern however her vibration is intact distally.  I do not think this is contributing to any balance issues (she denies having any falls in the last years) and the majority of her symptoms I do believe are due to her severe lumbar spinal stenosis and radiculopathy.  She has already had EMG nerve conduction study and evaluation of her peripheral polyneuropathy with Dr. Posey Pronto, I do not feel as though I have more to add there, it is stable and not progressive at this time and I do not think this is the crux of her problem and her symptoms are mostly from her low back and spinal stenosis with radiculopathy.  I answered a plethora of questions to my best ability. Extended visit with patient. She had a written list: On exercise. On cramps and hamstring.  Again really tried to make a distinction between peripheral polyneuropathy and spinal stenosis and radiculopathy; her generalized peripheral polyneuropathyis stable, she asked me to predict what will happen with her low back issues and I can;t say what will happen with her stenosis but it can be progressive,  I can't predict what will happen but she looks amazing and appears to be much younger than stated age. Plaquenil had affected her retina nerve fiber and can cause neuropathy but cannot say for certain, I explained to her it is very difficult to know what caused the peripheral polyneuropathy distally in her feet but it stable, not progressive, and the majority of her symptoms are coming from her spinal stenosis and radiculopathy.   I spent over 75 minutes just face to face and over 60  minutes reviewing images and records for a total of more than 135 minutes  I spent over 135 minutes of face-to-face  and non-face-to-face time with patient on the  1. Lumbar stenosis with neurogenic claudication   2. Lumbosacral radiculopathy   3. Numbness and tingling of foot   4. Other polyneuropathy    diagnosis.  This included previsit chart review, lab review, study review, order entry, electronic health record documentation, patient education on the different diagnostic and therapeutic options, counseling and coordination of care, risks and benefits of management, compliance, or risk factor reduction     Cc: Lawerance Cruel, MD,    Sarina Ill, MD  [facility] [address] [place], [place] [address]  Phone [phone] Fax [phone]

## 2019-10-16 ENCOUNTER — Encounter: Payer: Self-pay | Admitting: Neurology

## 2019-10-16 DIAGNOSIS — M48062 Spinal stenosis, lumbar region with neurogenic claudication: Secondary | ICD-10-CM | POA: Insufficient documentation

## 2019-10-16 DIAGNOSIS — M5417 Radiculopathy, lumbosacral region: Secondary | ICD-10-CM | POA: Insufficient documentation

## 2019-10-16 DIAGNOSIS — R202 Paresthesia of skin: Secondary | ICD-10-CM | POA: Insufficient documentation

## 2019-10-16 DIAGNOSIS — G629 Polyneuropathy, unspecified: Secondary | ICD-10-CM | POA: Insufficient documentation

## 2019-10-18 ENCOUNTER — Ambulatory Visit: Payer: Medicare Other | Admitting: Neurology

## 2019-10-21 ENCOUNTER — Telehealth: Payer: Self-pay | Admitting: Cardiology

## 2019-10-21 NOTE — Telephone Encounter (Signed)
New Message:    Pt said she sent a fax to Dr Martinique on Tuesday.  She wanted to make sure he received it.

## 2019-10-22 NOTE — Telephone Encounter (Signed)
Spoke to patient Dr. Martinique reviewed letter from your neurologist. He is not sure Crestor is causing neuropathy.He advised ok to hold Crestor for 2 months.Advised to call back to report symptoms.

## 2019-11-16 ENCOUNTER — Other Ambulatory Visit: Payer: Self-pay | Admitting: Chiropractic Medicine

## 2019-11-16 ENCOUNTER — Other Ambulatory Visit: Payer: Self-pay | Admitting: Cardiology

## 2019-11-16 DIAGNOSIS — M5416 Radiculopathy, lumbar region: Secondary | ICD-10-CM

## 2019-11-17 ENCOUNTER — Telehealth: Payer: Self-pay

## 2019-11-17 NOTE — Telephone Encounter (Signed)
   [facility] Pre-operative Risk Assessment    HEARTCARE STAFF: - Please ensure there is not already an duplicate clearance open for this procedure. - Under Visit Info/Reason for Call, type in Other and utilize the format Clearance MM/DD/YY or Clearance TBD. Do not use dashes or single digits. - If request is for dental extraction, please clarify the # of teeth to be extracted.  Request for surgical clearance:  1. What type of surgery is being performed? Lumbar nerve Root Block  2. When is this surgery scheduled? TBD  3. What type of clearance is required (medical clearance vs. Pharmacy clearance to hold med vs. Both)? Both  4. Are there any medications that need to be held prior to surgery and how long?Plavix 5 Days   5. Practice name and name of physician performing surgery? [facility]    6. What is the office phone number? [phone]   7.   What is the office fax number? [phone]  8.   Anesthesia type (None, local, MAC, general) ? Local   Monia Pouch 11/17/2019, 11:04 AM  _________________________________________________________________   (provider comments below)

## 2019-11-17 NOTE — Telephone Encounter (Signed)
   Primary Cardiologist: Peter Martinique, MD  Chart reviewed as part of pre-operative protocol coverage. Per previous recommendations, would be reasonable to hold plavix 5 days prior to her procedure as long as she is not having anginal symptoms given lack of change in her medical history since this recommendation was made 08/2019.   Left a voicemail for patient to call back for ongoing preoperative assessment.   Abigail Butts, PA-C 11/17/2019, 3:22 PM

## 2019-11-18 NOTE — Telephone Encounter (Signed)
Pamela Gonzales is calling stating she is delaying her procedure so she is no longer needing clearance at this time.

## 2019-11-19 ENCOUNTER — Other Ambulatory Visit: Payer: Self-pay | Admitting: Cardiology

## 2019-11-24 ENCOUNTER — Telehealth: Payer: Self-pay

## 2019-11-24 NOTE — Telephone Encounter (Signed)
Pt is returning call.  

## 2019-11-24 NOTE — Telephone Encounter (Signed)
   Primary Cardiologist: Peter Martinique, MD  Chart reviewed as part of pre-operative protocol coverage. Patient was previously cleared for this procedure on September 20, 2019 and Dr. Martinique said it was okay to hold Plavix 5 days prior to procedure. However, at that time, the procedure was postponed. I called patient and she denies any changes in cardiac symptoms. No chest pain, shortness of breath, palpitations, dizziness, lower leg edema, or orthopnea. Given past medical history and time since last visit, based on ACC/AHA guidelines, Pamela Gonzales would be at acceptable risk for the planned procedure without further cardiovascular testing.   Per prior clearance "OK to hold Plavix 5 days pre op.  We would prefer the patient remain on aspirin if possible but if needed this can be held 3-5 days pre op. Resume both as soon as safe post op."  The patient was advised that if she develops new symptoms prior to surgery to contact our office to arrange for a follow-up visit, and she verbalized understanding.  I will route this recommendation to the requesting party via Epic fax function and remove from pre-op pool.  Please call with questions.  Lexie Koehl Ninfa Meeker, PA-C 11/24/2019, 3:37 PM

## 2019-11-24 NOTE — Telephone Encounter (Signed)
Called and LVM to call back. Patient was previously cleared for this procedure in August 2021- Dr. Martinique said plavix could be held 5 days prior. However new form also requesting medical clearance so patient needs a call regarding cardiac symptoms.

## 2019-11-24 NOTE — Telephone Encounter (Signed)
° °  [facility] Pre-operative Risk Assessment    HEARTCARE STAFF: - Please ensure there is not already an duplicate clearance open for this procedure. - Under Visit Info/Reason for Call, type in Other and utilize the format Clearance MM/DD/YY or Clearance TBD. Do not use dashes or single digits. - If request is for dental extraction, please clarify the # of teeth to be extracted.  Request for surgical clearance:  1. What type of surgery is being performed? Lumbar Nerve Root block   2. When is this surgery scheduled? TBD   3. What type of clearance is required (medical clearance vs. Pharmacy clearance to hold med vs. Both)? Medical  4. Are there any medications that need to be held prior to surgery and how long?Plavix 5 Days   5. Practice name and name of physician performing surgery? [facility],   6. What is the office phone number? [phone]   7.   What is the office fax number? [phone]  8.   Anesthesia type (None, local, MAC, general) ? Local   Monia Pouch 11/24/2019, 1:34 PM  _________________________________________________________________   (provider comments below)

## 2019-11-26 DIAGNOSIS — M5416 Radiculopathy, lumbar region: Secondary | ICD-10-CM | POA: Diagnosis not present

## 2019-12-02 DIAGNOSIS — R300 Dysuria: Secondary | ICD-10-CM | POA: Diagnosis not present

## 2019-12-09 ENCOUNTER — Other Ambulatory Visit: Payer: Self-pay | Admitting: Chiropractic Medicine

## 2019-12-09 ENCOUNTER — Ambulatory Visit
Admission: RE | Admit: 2019-12-09 | Discharge: 2019-12-09 | Disposition: A | Discharge status: Home / Self Care | Payer: Medicare PPO | Source: Ambulatory Visit | Attending: Chiropractic Medicine | Admitting: Chiropractic Medicine

## 2019-12-09 DIAGNOSIS — M48061 Spinal stenosis, lumbar region without neurogenic claudication: Secondary | ICD-10-CM | POA: Diagnosis not present

## 2019-12-09 DIAGNOSIS — M47817 Spondylosis without myelopathy or radiculopathy, lumbosacral region: Secondary | ICD-10-CM | POA: Diagnosis not present

## 2019-12-09 DIAGNOSIS — M5416 Radiculopathy, lumbar region: Secondary | ICD-10-CM

## 2019-12-09 MED ORDER — IOPAMIDOL (ISOVUE-M 200) INJECTION 41%
1.0000 mL | Freq: Once | INTRAMUSCULAR | Status: AC
  Administered 2019-12-09: 1 mL via EPIDURAL

## 2019-12-09 MED ORDER — METHYLPREDNISOLONE ACETATE 40 MG/ML INJ SUSP (RADIOLOG
120.0000 mg | Freq: Once | INTRAMUSCULAR | Status: AC
  Administered 2019-12-09: 120 mg via EPIDURAL

## 2019-12-09 NOTE — Discharge Instructions (Signed)
Post Procedure Spinal Discharge Instruction Sheet  1. You may resume a regular diet and any medications that you routinely take (including pain medications).  2. No driving day of procedure.  3. Light activity throughout the rest of the day.  Do not do any strenuous work, exercise, bending or lifting.  The day following the procedure, you can resume normal physical activity but you should refrain from exercising or physical therapy for at least three days thereafter.   Common Side Effects:   Headaches- take your usual medications as directed by your physician.  Increase your fluid intake.  Caffeinated beverages may be helpful.  Lie flat in bed until your headache resolves.   Restlessness or inability to sleep- you may have trouble sleeping for the next few days.  Ask your referring physician if you need any medication for sleep.   Facial flushing or redness- should subside within a few days.   Increased pain- a temporary increase in pain a day or two following your procedure is not unusual.  Take your pain medication as prescribed by your referring physician.   Leg cramps  Please contact our office at [phone] for the following symptoms:  Fever greater than 100 degrees.  Headaches unresolved with medication after 2-3 days.  Increased swelling, pain, or redness at injection site.\  You may resume your Plavix today.

## 2019-12-27 ENCOUNTER — Encounter: Payer: Self-pay | Admitting: Internal Medicine

## 2019-12-27 ENCOUNTER — Ambulatory Visit: Payer: Medicare PPO | Admitting: Internal Medicine

## 2019-12-27 VITALS — BP 138/70 | HR 74 | Ht 64.0 in | Wt 200.0 lb

## 2019-12-27 DIAGNOSIS — K449 Diaphragmatic hernia without obstruction or gangrene: Secondary | ICD-10-CM

## 2019-12-27 DIAGNOSIS — K5900 Constipation, unspecified: Secondary | ICD-10-CM | POA: Diagnosis not present

## 2019-12-27 DIAGNOSIS — K219 Gastro-esophageal reflux disease without esophagitis: Secondary | ICD-10-CM | POA: Diagnosis not present

## 2019-12-27 NOTE — Patient Instructions (Addendum)
Start over the counter Activia x 1 month.  Continue pantoprazole daily.   Follow up with Dr. Hilarie Fredrickson in 3 months.

## 2019-12-27 NOTE — Progress Notes (Signed)
   Subjective:    Patient ID: Pamela Gonzales, female    DOB: 05-21-1930, 86 y.o.   MRN: [id number]  HPI Pamela Gonzales is an 86 year old female with a history of GERD with complex hiatal hernia and history of Cameron's erosions, IDA felt secondary to hiatal hernia, prior gastritis, chronic constipation, diverticulosis and remote colon polyps here for follow-up.  She is here alone today and was last seen on 09/22/2019.  She reports she has been doing very well.  She is off of Colace and treating her history of constipation with high-fiber diet rich in fruits and vegetables.  She is eating a lot of pears of late and this helps with bowel movement at least every other day.  She does have a somewhat uncomfortable lower abdominal feeling on days when she does not have a bowel movement but no pain.  Hemorrhoids have not been a problem of late and when they do bother her she uses over-the-counter lidocaine preparation.  No blood in stool or melena.  Heartburn is well controlled on pantoprazole 40 mg daily.  No dysphagia symptoms.  She has had 2 bladder infections over the last 3 months and wonders if probiotics may help prevent these.  She spent Thanksgiving with her son who came from [place].  They went out and had a nice Thanksgiving meal at a [employer]  Review of Systems As per HPI, otherwise negative  Current Medications, Allergies, Past Medical History, Past Surgical History, Family History and Social History were reviewed in [employer] record.     Objective:   Physical Exam BP 138/70   Pulse 74   Ht 5\' 4"  (1.626 m)   Wt 200 lb (90.7 kg)   SpO2 98%   BMI 34.33 kg/m  Gen: awake, alert, NAD HEENT: anicteric CV: RRR, no mrg Pulm: CTA b/l Ext: no c/c/e Neuro: nonfocal      Assessment & Plan:   86 year old female with a history of GERD with complex hiatal hernia and history of Cameron's erosions, IDA felt secondary to hiatal hernia, prior gastritis, chronic  constipation, diverticulosis and remote colon polyps here for follow-up.   1.  Mild constipation/dyssynergic defecation --doing well with high-fiber diet.  Colace seem to lose efficacy --Continue high-fiber diet --Can try MiraLAX in the future if needed --We spent time today discussing probiotic which is very unlikely to harm but likely only helps about a third of people who use them.  Could help with bowel regularity and may help prevent UTI.  She will try Activia for a month.  2.  GERD with hiatal hernia --doing well without symptoms.  No abdominal pain.  Continue daily PPI --Pantoprazole 40 mg daily  3.  IDA secondary to #2 --received IV iron in the past but not needed of late.  Monitor blood counts routinely  30-month follow-up with patient request  20 minutes total spent today including patient facing time, coordination of care, reviewing medical history/procedures/pertinent radiology studies, and documentation of the encounter.

## 2020-01-11 DIAGNOSIS — M1711 Unilateral primary osteoarthritis, right knee: Secondary | ICD-10-CM | POA: Diagnosis not present

## 2020-01-12 ENCOUNTER — Other Ambulatory Visit: Payer: Self-pay | Admitting: Chiropractic Medicine

## 2020-01-12 DIAGNOSIS — M47896 Other spondylosis, lumbar region: Secondary | ICD-10-CM

## 2020-01-17 ENCOUNTER — Other Ambulatory Visit: Payer: Self-pay | Admitting: Obstetrics and Gynecology

## 2020-01-17 DIAGNOSIS — Z1231 Encounter for screening mammogram for malignant neoplasm of breast: Secondary | ICD-10-CM

## 2020-01-18 DIAGNOSIS — M1711 Unilateral primary osteoarthritis, right knee: Secondary | ICD-10-CM | POA: Diagnosis not present

## 2020-01-25 DIAGNOSIS — M1711 Unilateral primary osteoarthritis, right knee: Secondary | ICD-10-CM | POA: Diagnosis not present

## 2020-02-02 NOTE — Progress Notes (Signed)
Pamela Gonzales Date of Birth: [birth year] Medical Record [id number]  History of Present Illness: Pamela Gonzales is seen back today for follow up of CAD. She has a history of HLD, Lupus, GERD, OA, RBBB and CAD with past stents placed in 2005. She had stenting of the mid LAD and 2nd OM in 2005 with Cypher stents. Last Myoview was in June 2017 and was normal. She has remained on Plavix.   She did have surgery for a torn meniscus in her knee in February. Has since then had gel injections x 3 and fluid removed.  She is followed by Dr Hilarie Fredrickson for GERD, constipation and iron deficiency anemia felt to be partly due to [employer] lesions/hiatal hernia. Iron deficiency corrected with infusions.    On follow up today she is doing well from a cardiac standpoint.   She still  does water aerobics at least 3x/week. She has  neuropathy in her feet that limits her activity.  Has chronic orthopedic issues. She denies any chest pain or SOB. She brings a diary of her BP readings and it has been well controlled.   Current Outpatient Medications  Medication Sig Dispense Refill  . acetaminophen (TYLENOL) 500 MG tablet Take 500 mg by mouth 3 (three) times daily.    Marland Kitchen amLODipine (NORVASC) 5 MG tablet TAKE 1 TABLET BY MOUTH EVERY DAY 90 tablet 3  . aspirin 81 MG tablet Take 81 mg by mouth daily.    . clopidogrel (PLAVIX) 75 MG tablet Take 1 tablet (75 mg total) by mouth daily. 90 tablet 3  . Coenzyme Q10 (CO Q 10 PO) Take 200 mg by mouth daily.    Marland Kitchen dextromethorphan-guaiFENesin (MUCINEX DM) 30-600 MG 12hr tablet Take 2 tablets by mouth daily.    Marland Kitchen guaiFENesin (MUCINEX) 600 MG 12 hr tablet Take 600 mg by mouth 2 (two) times daily.    Marland Kitchen losartan (COZAAR) 100 MG tablet TAKE 1 TABLET BY MOUTH EVERY DAY 90 tablet 3  . Multiple Vitamins-Minerals (MULTIVITAMIN WITH MINERALS) tablet Take 1 tablet by mouth daily.    Marland Kitchen MYRBETRIQ 50 MG TB24 tablet Take 50 mg by mouth daily.   2  . NASONEX 50 MCG/ACT nasal spray 2 sprays by Nasal route  Daily.    . pantoprazole (PROTONIX) 40 MG tablet Take 40 mg by mouth daily.    . rosuvastatin (CRESTOR) 40 MG tablet TAKE 1 TABLET BY MOUTH EVERY DAY 90 tablet 3   No current facility-administered medications for this visit.    Allergies  Allergen Reactions  . Cephalexin   . Penicillins   . Tramadol   . Lisinopril Cough  . Oxycodone Itching  . Sulfa Drugs Cross Reactors Other (See Comments)    Skin irritation.    Past Medical History:  Diagnosis Date  . Anemia   . Arthritis   . Asthmatic bronchitis   . Atherosclerotic heart disease of native coronary artery without angina pectoris   . Benign fundic gland polyps of stomach   . CAD (coronary artery disease) 05   stents  . Daytime somnolence   . Diverticulosis   . Esophageal dysmotility   . Fibula fracture    right  . Gallstones   . Gastric erosions   . GERD (gastroesophageal reflux disease)   . Hiatal hernia   . Hypercholesteremia   . Hypertension   . Internal hemorrhoids   . Joint stiffness   . Lupus ([place])   . Osteoarthritis   . Osteoporosis   . Paresthesia   .  Peripheral neuropathy   . Peripheral neuropathy   . RBBB   . Reactive airways dysfunction syndrome (HCC)   . Sciatica   . Seasonal allergies   . Spinal stenosis   . Tubular adenoma of colon     Past Surgical History:  Procedure Laterality Date  . ABDOMINAL HYSTERECTOMY  1975  . BREAST EXCISIONAL BIOPSY Right   . CATARACT EXTRACTION, BILATERAL    . CHOLECYSTECTOMY  07/04/2011   Procedure: LAPAROSCOPIC CHOLECYSTECTOMY WITH INTRAOPERATIVE CHOLANGIOGRAM;  Surgeon: Robyne Askew, MD;  Location: [facility] OR;  Service: General;  Laterality: N/A;  . COLONOSCOPY W/ BIOPSIES    . CORONARY ANGIOPLASTY  2005  . ESOPHAGOGASTRODUODENOSCOPY (EGD) WITH PROPOFOL N/A 06/11/2016   Procedure: ESOPHAGOGASTRODUODENOSCOPY (EGD) WITH PROPOFOL;  Surgeon: Charolett Bumpers, MD;  Location: [facility] END[facility]COPY;  Service: Endoscopy;  Laterality: N/A;  . FIBULA FRACTURE SURGERY  2011    Right Ankle  . FLEXIBLE SIGMOIDOSCOPY N/A 07/09/2016   Procedure: FLEXIBLE SIGMOIDOSCOPY;  Surgeon: Charolett Bumpers, MD;  Location: [facility];  Service: Endoscopy;  Laterality: N/A;  . KNEE ARTHROSCOPY Right 03/15/2019   Procedure: Right knee arthroscopy; meniscal debridement chondroplasty;  Surgeon: Ollen Gross, MD;  Location: [facility] ORS;  Service: Orthopedics;  Laterality: Right;     Social History   Tobacco Use  Smoking Status Never Smoker  Smokeless Tobacco Never Used    Social History   Substance and Sexual Activity  Alcohol Use Yes   Comment: very rare    Family History  Problem Relation Age of Onset  . Lung cancer Father   . Heart attack Mother   . Heart attack Brother   . Heart disease Brother   . Rectal cancer Brother        only 1 kidney  . Cancer Sister   . Pancreatic cancer Sister   . Heart disease Sister   . Anesthesia problems Neg Hx   . Hypotension Neg Hx   . Malignant hyperthermia Neg Hx   . Pseudochol deficiency Neg Hx   . Breast cancer Neg Hx   . Colon cancer Neg Hx   . Stomach cancer Neg Hx     Review of Systems: The review of systems is per the HPI.  All other systems were reviewed and are negative.  Physical Exam: There were no vitals taken for this visit. GENERAL:  Well appearing, obese WF in NAD HEENT:  PERRL, EOMI, sclera are clear. Oropharynx is clear. NECK:  No jugular venous distention, carotid upstroke brisk and symmetric, no bruits, no thyromegaly or adenopathy LUNGS:  Clear to auscultation bilaterally CHEST:  Unremarkable HEART:  RRR,  PMI not displaced or sustained,S1 and S2 within normal limits, no S3, no S4: no clicks, no rubs, no murmurs EXT:  2 + pulses throughout, no edema, no cyanosis no clubbing SKIN:  Warm and dry.  No rashes NEURO:  Alert and oriented x 3. Cranial nerves II through XII intact. PSYCH:  Cognitively intact  LABORATORY DATA:  Lab Results  Component Value Date   WBC 8.4 06/23/2019   HGB 14.0  06/23/2019   HCT 40.8 06/23/2019   PLT 184.0 06/23/2019   GLUCOSE 95 07/20/2019   CHOL 166 07/20/2019   TRIG 118 07/20/2019   HDL 62 07/20/2019   LDLCALC 83 07/20/2019   ALT 15 07/20/2019   AST 24 07/20/2019   NA 141 07/20/2019   K 4.7 07/20/2019   CL 105 07/20/2019   CREATININE 0.72 07/20/2019   BUN 12 07/20/2019  CO2 24 07/20/2019   INR 1.07 08/03/2009   HGBA1C 5.4 05/10/2010    Ecg today shows NSR with first degree AV block and RBBB. I have personally reviewed and interpreted this study.  Myoview study 07/26/15:Study Highlights    The left ventricular ejection fraction is hyperdynamic (>65%).  Nuclear stress EF: 72%.  There was no ST segment deviation noted during stress.  The study is normal.  This is a low risk study.   This is a normal pharmacologic nuclear study with no evidence of prior infarct or ischemia. Normal LVEF.       Assessment / Plan: 1. CAD - s/p stenting of the LAD and OM in 2005 with first generation Cypher stents. Normal Myoview in June 2017.  She is asymptomatic. Since she has first generation DES we have continued  DAPT indefinitely.   2. HTN - BP control is good.   3. HLD - on statin. LDL 83. Goal 70. On maximal dose of Crestor. Per prior discussion she would like to avoid additional medical therapy.   4. Chronic RBBB since 2014.    5. Peripheral neuropathy  6. Iron deficiency anemia. Improved.   I will follow up in 6 months with lab work

## 2020-02-04 ENCOUNTER — Encounter: Payer: Self-pay | Admitting: Cardiology

## 2020-02-04 ENCOUNTER — Ambulatory Visit: Payer: Medicare PPO | Admitting: Cardiology

## 2020-02-04 ENCOUNTER — Other Ambulatory Visit: Payer: Self-pay

## 2020-02-04 VITALS — BP 138/60 | HR 85 | Wt 201.8 lb

## 2020-02-04 DIAGNOSIS — E7800 Pure hypercholesterolemia, unspecified: Secondary | ICD-10-CM | POA: Diagnosis not present

## 2020-02-04 DIAGNOSIS — I10 Essential (primary) hypertension: Secondary | ICD-10-CM

## 2020-02-04 DIAGNOSIS — I4510 Unspecified right bundle-branch block: Secondary | ICD-10-CM | POA: Diagnosis not present

## 2020-02-04 DIAGNOSIS — I2510 Atherosclerotic heart disease of native coronary artery without angina pectoris: Secondary | ICD-10-CM | POA: Diagnosis not present

## 2020-02-04 NOTE — Addendum Note (Signed)
Addended by: Kathyrn Lass on: 02/04/2020 03:14 PM   Modules accepted: Orders

## 2020-02-08 ENCOUNTER — Other Ambulatory Visit: Payer: Self-pay | Admitting: Chiropractic Medicine

## 2020-02-08 ENCOUNTER — Ambulatory Visit
Admission: RE | Admit: 2020-02-08 | Discharge: 2020-02-08 | Disposition: A | Discharge status: Home / Self Care | Payer: Medicare PPO | Source: Ambulatory Visit | Attending: Chiropractic Medicine | Admitting: Chiropractic Medicine

## 2020-02-08 DIAGNOSIS — M47896 Other spondylosis, lumbar region: Secondary | ICD-10-CM

## 2020-02-08 DIAGNOSIS — M47817 Spondylosis without myelopathy or radiculopathy, lumbosacral region: Secondary | ICD-10-CM | POA: Diagnosis not present

## 2020-02-08 MED ORDER — METHYLPREDNISOLONE ACETATE 40 MG/ML INJ SUSP (RADIOLOG
120.0000 mg | Freq: Once | INTRAMUSCULAR | Status: AC
  Administered 2020-02-08: 120 mg via INTRA_ARTICULAR

## 2020-02-08 MED ORDER — IOPAMIDOL (ISOVUE-M 200) INJECTION 41%
1.0000 mL | Freq: Once | INTRAMUSCULAR | Status: AC
  Administered 2020-02-08: 1 mL via INTRA_ARTICULAR

## 2020-02-08 NOTE — Discharge Instructions (Signed)

## 2020-02-10 DIAGNOSIS — N390 Urinary tract infection, site not specified: Secondary | ICD-10-CM | POA: Diagnosis not present

## 2020-02-10 DIAGNOSIS — N3946 Mixed incontinence: Secondary | ICD-10-CM | POA: Diagnosis not present

## 2020-02-11 IMAGING — CT CT ABD-PELV W/ CM
2 of 5 series · 16 of 46 positions shown, 18 images · IV contrast (ISOVUE 300)
Comparison: 04/13/2013

CLINICAL DATA: Epigastric pain, anemia, complex hiatal hernia

EXAM:
CT ABDOMEN AND PELVIS WITH CONTRAST
TECHNIQUE: Multidetector CT imaging of the abdomen and pelvis was performed
using the standard protocol following bolus administration of
intravenous contrast.
CONTRAST:  100mL A6NOCG-D22 IOPAMIDOL (A6NOCG-D22) INJECTION 61%

[Series 2: abd/pel w · axial · 0.90mm/px · z∈[+808,+1178]mm · 13 of 84 slices shown, 15 images]
[im 5/84  soft-tissue]
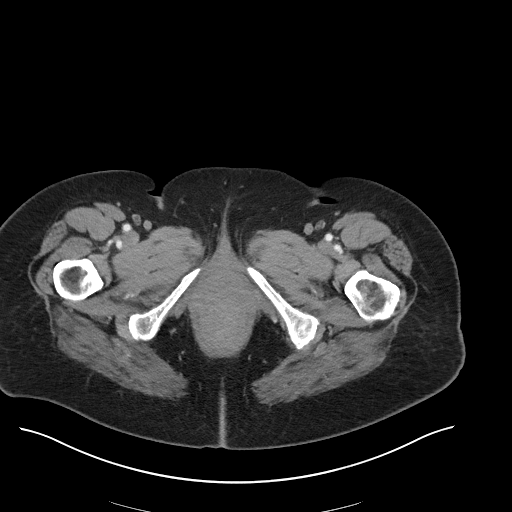
[im 5/84  bone]
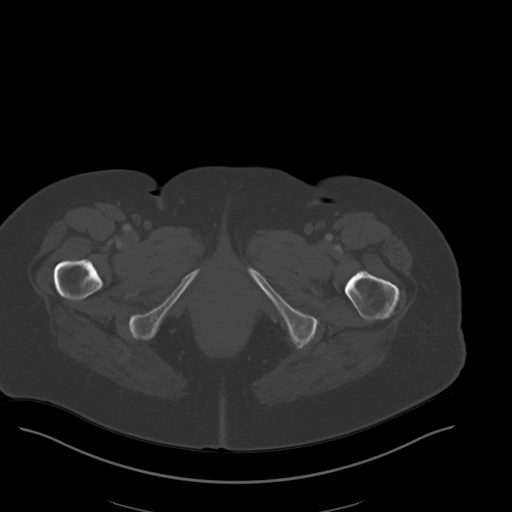
[im 10/84  soft-tissue]
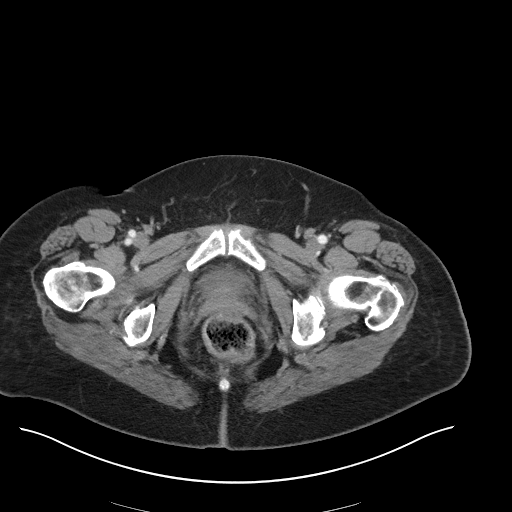
[im 20/84  soft-tissue]
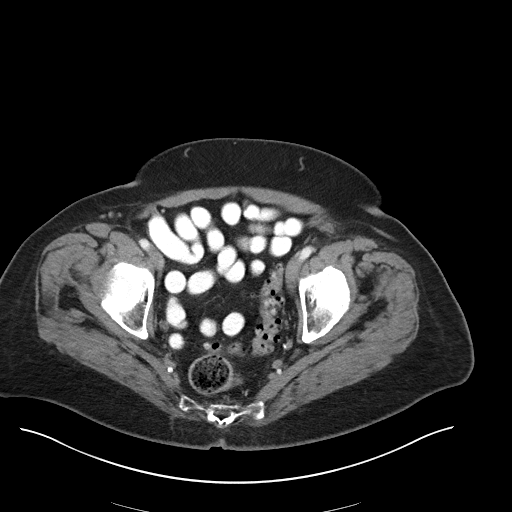
[im 25/84  soft-tissue]
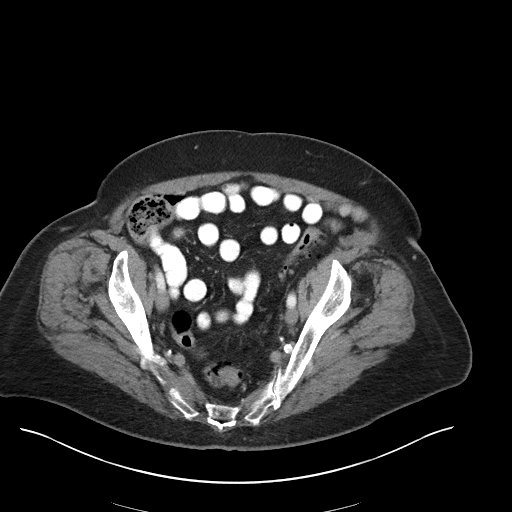
[im 30/84  soft-tissue]
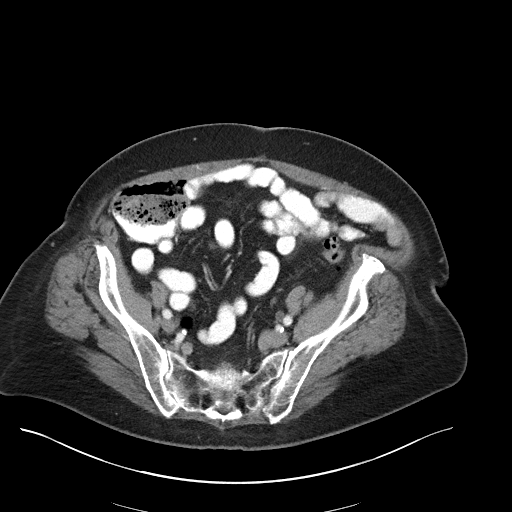
[im 35/84  soft-tissue]
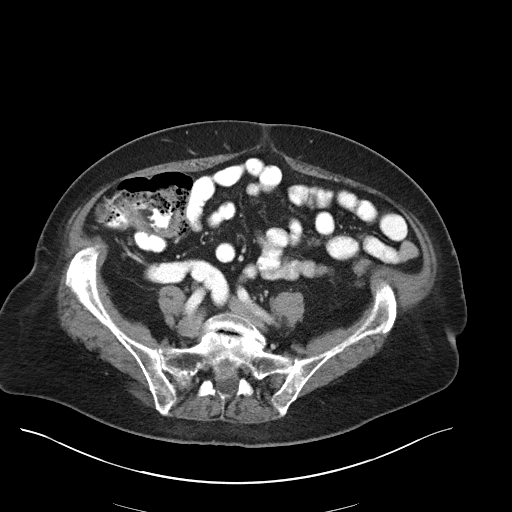
[im 44/84  soft-tissue]
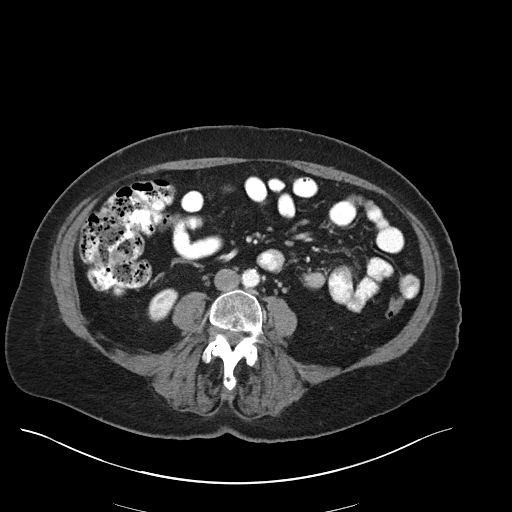
[im 49/84  soft-tissue]
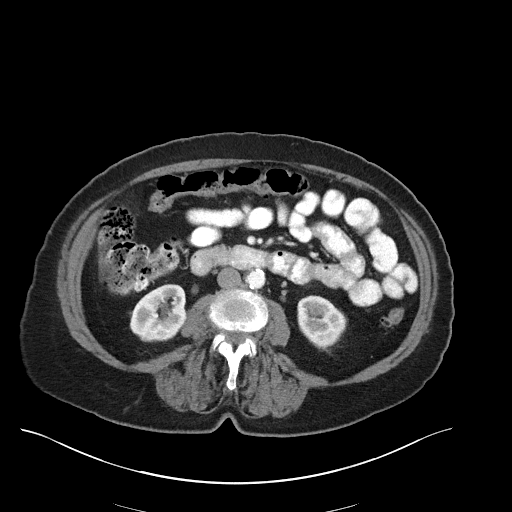
[im 54/84  soft-tissue]
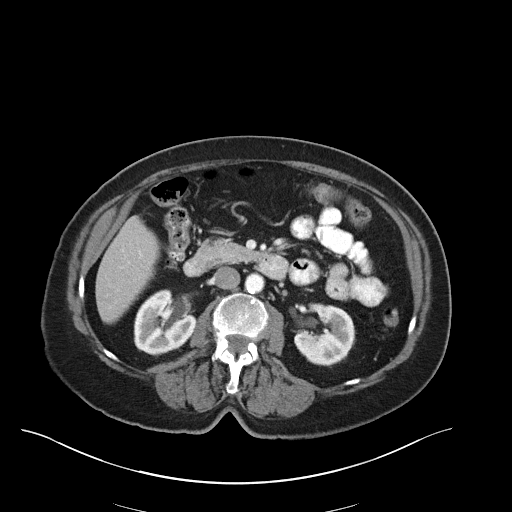
[im 54/84  bone]
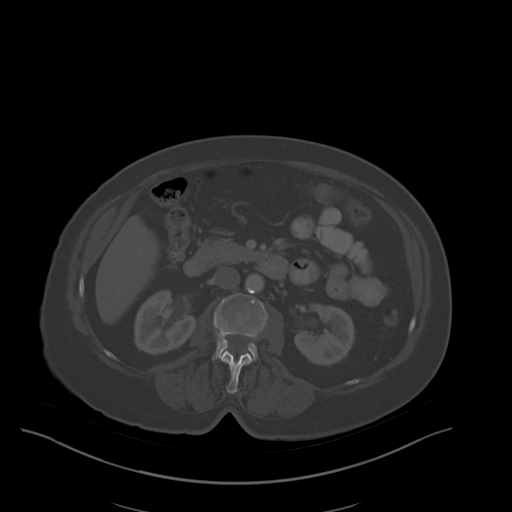
[im 59/84  soft-tissue]
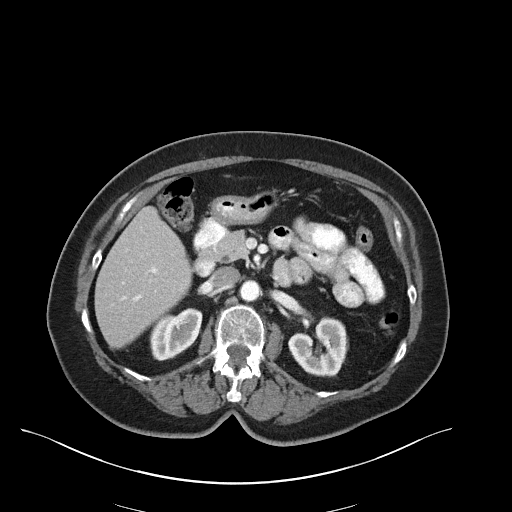
[im 64/84  soft-tissue]
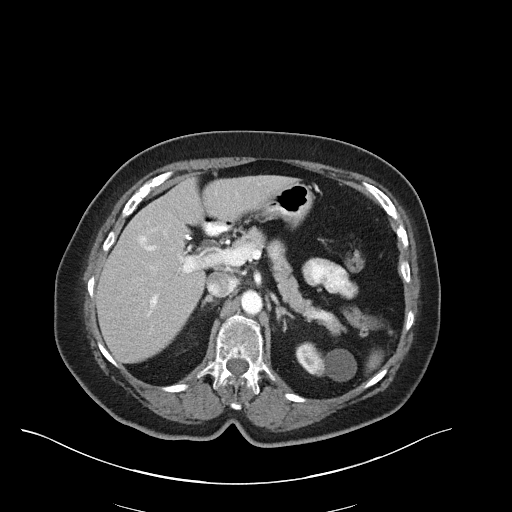
[im 74/84  soft-tissue]
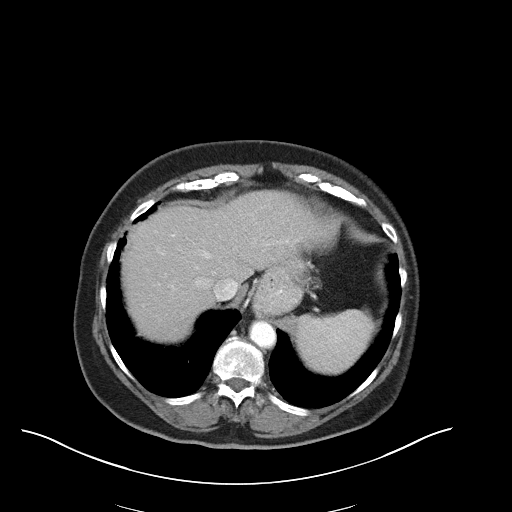
[im 79/84  soft-tissue]
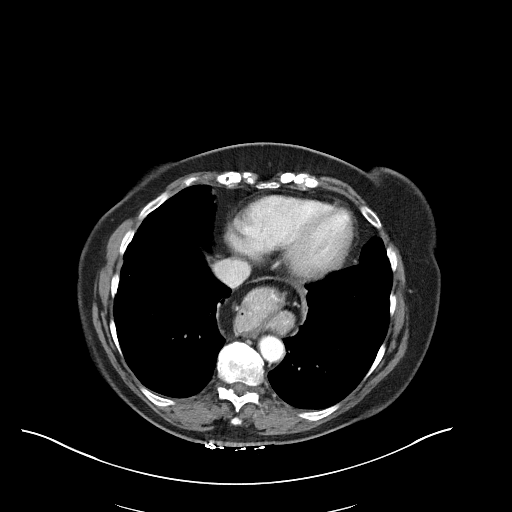

[Series 5: abd/pel w st · coronal · 0.89mm/px · 3 of 101 slices shown]
[im 34/101  soft-tissue]
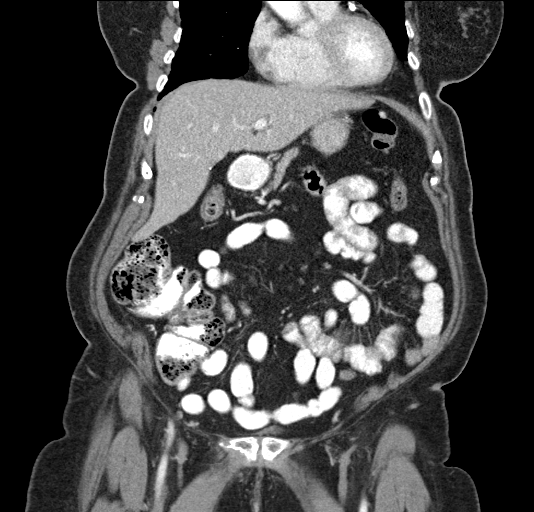
[im 45/101  soft-tissue]
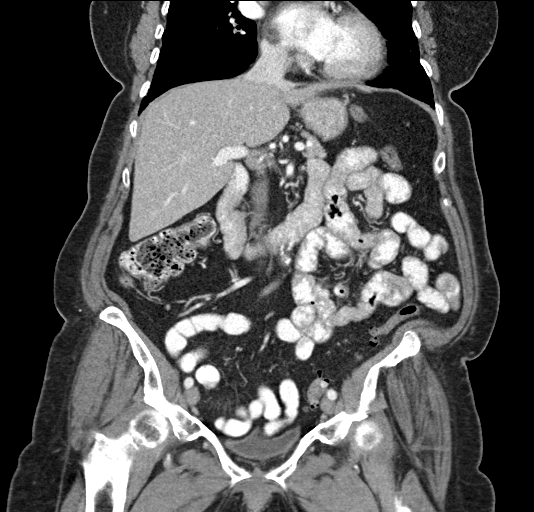
[im 56/101  soft-tissue]
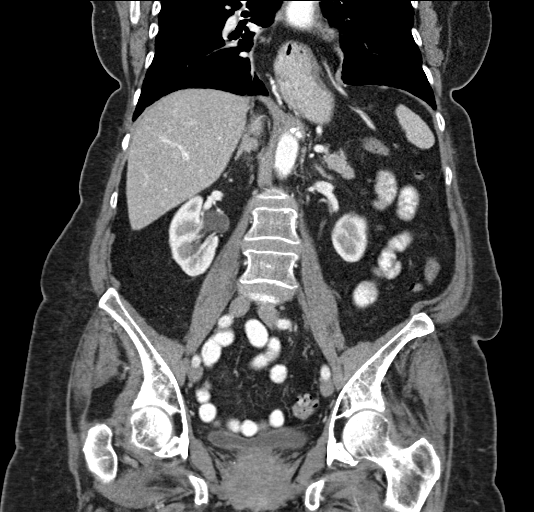

[16 of 46 positions shown; findings below may reference images not displayed]

FINDINGS: Lower chest: Lung bases are clear.

Hepatobiliary: Liver is within normal limits. No
suspicious/enhancing hepatic lesions.

Status post cholecystectomy. No intrahepatic or extrahepatic ductal
dilatation.

Pancreas: Within normal limits.

Spleen: Within normal limits.

Adrenals/Urinary Tract: Adrenal glands are within normal limits.

3.2 cm lateral left upper pole renal cyst. Right kidney is within
normal limits. No hydronephrosis.

Bladder is underdistended but unremarkable.

Stomach/Bowel: Stomach is notable for a moderate hiatal hernia. No
wall thickening or inflammatory changes.

No evidence of bowel obstruction.

Appendix is not discretely visualized and may be surgically absent.

Sigmoid diverticulosis, without evidence of diverticulitis.

Vascular/Lymphatic: No evidence of abdominal aortic aneurysm.

Atherosclerotic calcifications of the abdominal aorta and branch
vessels.

No suspicious abdominopelvic lymphadenopathy.

Reproductive: Status post hysterectomy.

No adnexal masses.

Other: No abdominopelvic ascites.

Tiny fat containing periumbilical hernia.

Musculoskeletal: Mild degenerative changes of the lower thoracic
spine. Grade 1 anterolisthesis of L5 on S1.
IMPRESSION: Moderate hiatal hernia.  No wall thickening or inflammatory changes.

Sigmoid diverticulosis, without evidence of diverticulitis.

No evidence of bowel obstruction.

Status post cholecystectomy and hysterectomy. Appendix is not
discretely visualized and may be surgically absent.

## 2020-02-12 ENCOUNTER — Other Ambulatory Visit: Payer: Self-pay | Admitting: Obstetrics and Gynecology

## 2020-02-12 DIAGNOSIS — E2839 Other primary ovarian failure: Secondary | ICD-10-CM

## 2020-02-18 ENCOUNTER — Other Ambulatory Visit: Payer: Self-pay | Admitting: Cardiology

## 2020-03-01 ENCOUNTER — Other Ambulatory Visit: Payer: Self-pay

## 2020-03-01 ENCOUNTER — Other Ambulatory Visit: Payer: Self-pay | Admitting: Obstetrics and Gynecology

## 2020-03-01 ENCOUNTER — Ambulatory Visit
Admission: RE | Admit: 2020-03-01 | Discharge: 2020-03-01 | Disposition: A | Discharge status: Home / Self Care | Payer: Medicare PPO | Source: Ambulatory Visit | Attending: Obstetrics and Gynecology | Admitting: Obstetrics and Gynecology

## 2020-03-01 DIAGNOSIS — N644 Mastodynia: Secondary | ICD-10-CM

## 2020-03-01 DIAGNOSIS — Z1231 Encounter for screening mammogram for malignant neoplasm of breast: Secondary | ICD-10-CM

## 2020-03-09 ENCOUNTER — Ambulatory Visit
Admission: RE | Admit: 2020-03-09 | Discharge: 2020-03-09 | Disposition: A | Discharge status: Home / Self Care | Payer: Medicare PPO | Source: Ambulatory Visit | Attending: Obstetrics and Gynecology | Admitting: Obstetrics and Gynecology

## 2020-03-09 ENCOUNTER — Other Ambulatory Visit: Payer: Self-pay

## 2020-03-09 DIAGNOSIS — N644 Mastodynia: Secondary | ICD-10-CM

## 2020-03-09 DIAGNOSIS — N6321 Unspecified lump in the left breast, upper outer quadrant: Secondary | ICD-10-CM | POA: Diagnosis not present

## 2020-03-09 DIAGNOSIS — N6323 Unspecified lump in the left breast, lower outer quadrant: Secondary | ICD-10-CM | POA: Diagnosis not present

## 2020-03-24 ENCOUNTER — Encounter: Payer: Self-pay | Admitting: Internal Medicine

## 2020-03-24 ENCOUNTER — Ambulatory Visit: Payer: Medicare PPO | Admitting: Internal Medicine

## 2020-03-24 VITALS — BP 128/68 | HR 78 | Ht 64.0 in | Wt 202.0 lb

## 2020-03-24 DIAGNOSIS — K5900 Constipation, unspecified: Secondary | ICD-10-CM

## 2020-03-24 DIAGNOSIS — K219 Gastro-esophageal reflux disease without esophagitis: Secondary | ICD-10-CM

## 2020-03-24 DIAGNOSIS — K449 Diaphragmatic hernia without obstruction or gangrene: Secondary | ICD-10-CM | POA: Diagnosis not present

## 2020-03-24 DIAGNOSIS — D500 Iron deficiency anemia secondary to blood loss (chronic): Secondary | ICD-10-CM | POA: Diagnosis not present

## 2020-03-24 MED ORDER — PANTOPRAZOLE SODIUM 40 MG PO TBEC: 40.0000 mg | DELAYED_RELEASE_TABLET | Freq: Every day | ORAL | 1 refills | Status: DC

## 2020-03-24 NOTE — Progress Notes (Signed)
   Subjective:    Patient ID: Pamela Gonzales, female    DOB: 23-Sep-1930, 86 y.o.   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  She was last seen on 12/27/2019.  She has been doing very well.  She is continue pantoprazole without any heartburn symptom.  No dysphagia symptom.  No abdominal pain.  Bowel movements which can be slow are better when she eats high-fiber foods like fruits and also when she takes Activia yogurt.  At times she forgets to take Activia or just does not seem appetizing to her.  No blood in stool or melena.  Chronic arthritis back and leg pain is treated with Tylenol.  She typically takes 500 mg 3 times daily and wonders if she can slightly increase the dose safely.  She continues to be very active working for 5 days/week in the pool doing water aerobics.  No recent hemorrhoidal symptoms or complaints.  She does have to has named "Pamela Gonzales".   Review of Systems As per HPI, otherwise negative  Current Medications, Allergies, Past Medical History, Past Surgical History, Family History and Social History were reviewed in [employer] record.     Objective:   Physical Exam BP 128/68 (BP Location: Right Arm, Patient Position: Sitting, Cuff Size: Large)   Pulse 78   Ht 5\' 4"  (1.626 m)   Wt 202 lb (91.6 kg)   BMI 34.67 kg/m  Gen: awake, alert, NAD HEENT: anicteric  Neuro: nonfocal     Assessment & Plan:  87 year old female with a history of GERD with complex hiatal hernia and Cameron's erosions, IDA felt secondary to Cameron's erosions and hiatal hernia, history of gastritis, chronic constipation, diverticulosis and remote colon polyps who is here for follow-up.   1.  Mild  constipation/dyssynergic defecation --doing well with high-fiber diet and activity a yogurt --Continue high-fiber diet and Activia --Can use MiraLAX if needed  2.  GERD with hiatal hernia and Cameron's erosions --no symptoms on daily PPI --Continue pantoprazole 40 mg daily  3.  IDA secondary to #2 above --she has received IV iron in the past but not needed of late.  We will follow blood counts and iron studies routinely  4.  Joint pains --I reassured her that she could safely use acetaminophen up to 4 g in any 24-hour period.  She does not wish to use it does this high so I reassured her that she could safely take 1 g in the morning and then 500 mg at midday and bedtime.  She avoids NSAIDs.  She will also continue on her  44-month follow-up per patient request, sooner if needed  20 minutes total spent today including patient facing time, coordination of care, reviewing medical history/procedures/pertinent radiology studies, and documentation of the encounter.

## 2020-03-24 NOTE — Patient Instructions (Signed)
We have sent the following medications to your pharmacy for you to pick up at your convenience: Pantoprazole  Please follow up with Dr Hilarie Fredrickson in 6 months.  If you are age 85 or older, your body mass index should be between 23-30. Your Body mass index is 34.67 kg/m. If this is out of the aforementioned range listed, please consider follow up with your Primary Care Provider.  If you are age 78 or younger, your body mass index should be between 19-25. Your Body mass index is 34.67 kg/m. If this is out of the aformentioned range listed, please consider follow up with your Primary Care Provider.   Due to recent changes in healthcare laws, you may see the results of your imaging and laboratory studies on MyChart before your provider has had a chance to review them.  We understand that in some cases there may be results that are confusing or concerning to you. Not all laboratory results come back in the same time frame and the provider may be waiting for multiple results in order to interpret others.  Please give [place] 48 hours in order for your provider to thoroughly review all the results before contacting the office for clarification of your results.

## 2020-03-25 DIAGNOSIS — T2125XA Burn of second degree of buttock, initial encounter: Secondary | ICD-10-CM | POA: Diagnosis not present

## 2020-04-06 ENCOUNTER — Telehealth: Payer: Self-pay | Admitting: Internal Medicine

## 2020-04-06 NOTE — Telephone Encounter (Signed)
I have spoken to the patient and have answered her questions.

## 2020-04-06 NOTE — Telephone Encounter (Signed)
Patient called and requested to only speak with you no other information provided

## 2020-05-01 DIAGNOSIS — M47816 Spondylosis without myelopathy or radiculopathy, lumbar region: Secondary | ICD-10-CM | POA: Diagnosis not present

## 2020-05-02 ENCOUNTER — Other Ambulatory Visit: Payer: Self-pay | Admitting: Chiropractic Medicine

## 2020-05-02 DIAGNOSIS — M4316 Spondylolisthesis, lumbar region: Secondary | ICD-10-CM

## 2020-05-18 ENCOUNTER — Other Ambulatory Visit: Payer: Self-pay | Admitting: Chiropractic Medicine

## 2020-05-18 ENCOUNTER — Ambulatory Visit
Admission: RE | Admit: 2020-05-18 | Discharge: 2020-05-18 | Disposition: A | Discharge status: Home / Self Care | Payer: Medicare PPO | Source: Ambulatory Visit | Attending: Chiropractic Medicine | Admitting: Chiropractic Medicine

## 2020-05-18 ENCOUNTER — Other Ambulatory Visit: Payer: Self-pay

## 2020-05-18 DIAGNOSIS — M4316 Spondylolisthesis, lumbar region: Secondary | ICD-10-CM

## 2020-05-18 DIAGNOSIS — M47817 Spondylosis without myelopathy or radiculopathy, lumbosacral region: Secondary | ICD-10-CM | POA: Diagnosis not present

## 2020-05-18 NOTE — Discharge Instructions (Signed)

## 2020-05-23 DIAGNOSIS — R0981 Nasal congestion: Secondary | ICD-10-CM | POA: Diagnosis not present

## 2020-05-23 DIAGNOSIS — Z03818 Encounter for observation for suspected exposure to other biological agents ruled out: Secondary | ICD-10-CM | POA: Diagnosis not present

## 2020-05-23 DIAGNOSIS — B349 Viral infection, unspecified: Secondary | ICD-10-CM | POA: Diagnosis not present

## 2020-05-23 DIAGNOSIS — R059 Cough, unspecified: Secondary | ICD-10-CM | POA: Diagnosis not present

## 2020-05-31 DIAGNOSIS — M5416 Radiculopathy, lumbar region: Secondary | ICD-10-CM | POA: Diagnosis not present

## 2020-06-01 ENCOUNTER — Other Ambulatory Visit: Payer: Self-pay | Admitting: Chiropractic Medicine

## 2020-06-01 ENCOUNTER — Other Ambulatory Visit: Payer: Self-pay | Admitting: Cardiology

## 2020-06-01 DIAGNOSIS — M5416 Radiculopathy, lumbar region: Secondary | ICD-10-CM

## 2020-06-05 ENCOUNTER — Other Ambulatory Visit: Payer: Self-pay

## 2020-06-05 ENCOUNTER — Other Ambulatory Visit (INDEPENDENT_AMBULATORY_CARE_PROVIDER_SITE_OTHER): Payer: Medicare PPO

## 2020-06-05 DIAGNOSIS — D500 Iron deficiency anemia secondary to blood loss (chronic): Secondary | ICD-10-CM | POA: Diagnosis not present

## 2020-06-05 LAB — CBC WITH DIFFERENTIAL/PLATELET
Basophils Absolute: 0 10*3/uL (ref 0.0–0.1)
Basophils Relative: 0.6 % (ref 0.0–3.0)
Eosinophils Absolute: 0.2 10*3/uL (ref 0.0–0.7)
Eosinophils Relative: 2.9 % (ref 0.0–5.0)
HCT: 38.2 % (ref 36.0–46.0)
Hemoglobin: 12.8 g/dL (ref 12.0–15.0)
Lymphocytes Relative: 22.5 % (ref 12.0–46.0)
Lymphs Abs: 1.6 10*3/uL (ref 0.7–4.0)
MCHC: 33.6 g/dL (ref 30.0–36.0)
MCV: 92.4 fl (ref 78.0–100.0)
Monocytes Absolute: 0.7 10*3/uL (ref 0.1–1.0)
Monocytes Relative: 10.5 % (ref 3.0–12.0)
Neutro Abs: 4.5 10*3/uL (ref 1.4–7.7)
Neutrophils Relative %: 63.5 % (ref 43.0–77.0)
Platelets: 219 10*3/uL (ref 150.0–400.0)
RBC: 4.13 Mil/uL (ref 3.87–5.11)
RDW: 13.5 % (ref 11.5–15.5)
WBC: 7 10*3/uL (ref 4.0–10.5)

## 2020-06-05 LAB — IBC + FERRITIN
Ferritin: 12.1 ng/mL (ref 10.0–291.0)
Iron: 72 ug/dL (ref 42–145)
Saturation Ratios: 17.2 % — ABNORMAL LOW (ref 20.0–50.0)
Transferrin: 299 mg/dL (ref 212.0–360.0)

## 2020-06-05 LAB — BASIC METABOLIC PANEL
BUN: 18 mg/dL (ref 6–23)
CO2: 27 mEq/L (ref 19–32)
Calcium: 9.3 mg/dL (ref 8.4–10.5)
Chloride: 106 mEq/L (ref 96–112)
Creatinine, Ser: 0.78 mg/dL (ref 0.40–1.20)
GFR: 68.1 mL/min (ref >60.00)
Glucose, Bld: 162 mg/dL — ABNORMAL HIGH (ref 70–99)
Potassium: 3.8 mEq/L (ref 3.5–5.1)
Sodium: 140 mEq/L (ref 135–145)

## 2020-06-05 NOTE — Telephone Encounter (Signed)
Please have her come for CBC + IBC + ferritin, BMP also Thanks JMP

## 2020-06-06 ENCOUNTER — Other Ambulatory Visit: Payer: Self-pay

## 2020-06-06 ENCOUNTER — Ambulatory Visit
Admission: RE | Admit: 2020-06-06 | Discharge: 2020-06-06 | Disposition: A | Discharge status: Home / Self Care | Payer: Medicare PPO | Source: Ambulatory Visit | Attending: Obstetrics and Gynecology | Admitting: Obstetrics and Gynecology

## 2020-06-06 DIAGNOSIS — Z780 Asymptomatic menopausal state: Secondary | ICD-10-CM | POA: Diagnosis not present

## 2020-06-06 DIAGNOSIS — M8589 Other specified disorders of bone density and structure, multiple sites: Secondary | ICD-10-CM | POA: Diagnosis not present

## 2020-06-06 DIAGNOSIS — E2839 Other primary ovarian failure: Secondary | ICD-10-CM

## 2020-06-06 DIAGNOSIS — D500 Iron deficiency anemia secondary to blood loss (chronic): Secondary | ICD-10-CM

## 2020-06-07 ENCOUNTER — Telehealth: Payer: Self-pay | Admitting: *Deleted

## 2020-06-07 ENCOUNTER — Other Ambulatory Visit: Payer: Self-pay | Admitting: Cardiology

## 2020-06-07 ENCOUNTER — Encounter: Payer: Self-pay | Admitting: Cardiology

## 2020-06-07 NOTE — Telephone Encounter (Signed)
Error

## 2020-06-07 NOTE — Telephone Encounter (Signed)
    [name] DOB:  10-27-1930  MRN:  [id number]   Primary Cardiologist: Peter Martinique, MD  Chart reviewed as part of pre-operative protocol coverage.   Per previous recommendation by Dr. Martinique, and given lack of interval change in cardiac history since that time, patient can hold plavix 5 days prior to her upcoming nerve block with plans to restart as soon as she is cleared to do so by her surgeon.  I will route this recommendation to the requesting party via Epic fax function and remove from pre-op pool.  Please call with questions.  Abigail Butts, PA-C 06/07/2020, 5:29 PM

## 2020-06-07 NOTE — Telephone Encounter (Signed)
   [facility] Pre-operative Risk Assessment    Patient Name: Pamela Gonzales  DOB: 1930/02/11  MRN: [id number]   Request for surgical clearance:  1. What type of surgery is being performed? Lumbar nerve root block   2. When is this surgery scheduled? TBD   3. What type of clearance is required (medical clearance vs. Pharmacy clearance to hold med vs. Both)? pharmacy  4. Are there any medications that need to be held prior to surgery and how long? Plavix x 5 days   5. Practice name and name of physician performing surgery? [facility]   6. What is the office phone number? [phone]   7.   What is the office fax number? [phone] attn: Cathy  8.   Anesthesia type (None, local, MAC, general) ?    Pamela Gonzales 06/07/2020, 4:04 PM  _________________________________________________________________   (provider comments below)

## 2020-06-16 ENCOUNTER — Other Ambulatory Visit: Payer: Self-pay | Admitting: Chiropractic Medicine

## 2020-06-16 ENCOUNTER — Ambulatory Visit
Admission: RE | Admit: 2020-06-16 | Discharge: 2020-06-16 | Disposition: A | Discharge status: Home / Self Care | Payer: Medicare PPO | Source: Ambulatory Visit | Attending: Chiropractic Medicine | Admitting: Chiropractic Medicine

## 2020-06-16 ENCOUNTER — Other Ambulatory Visit: Payer: Self-pay

## 2020-06-16 VITALS — BP 148/69 | HR 74

## 2020-06-16 DIAGNOSIS — M5417 Radiculopathy, lumbosacral region: Secondary | ICD-10-CM

## 2020-06-16 DIAGNOSIS — M48062 Spinal stenosis, lumbar region with neurogenic claudication: Secondary | ICD-10-CM

## 2020-06-16 DIAGNOSIS — M47817 Spondylosis without myelopathy or radiculopathy, lumbosacral region: Secondary | ICD-10-CM | POA: Diagnosis not present

## 2020-06-16 DIAGNOSIS — M5416 Radiculopathy, lumbar region: Secondary | ICD-10-CM

## 2020-06-16 MED ORDER — METHYLPREDNISOLONE ACETATE 40 MG/ML INJ SUSP (RADIOLOG: 80.0000 mg | Freq: Once | INTRAMUSCULAR | Status: DC

## 2020-06-16 MED ORDER — DIAZEPAM 5 MG PO TABS
5.0000 mg | ORAL_TABLET | Freq: Once | ORAL | Status: AC
  Administered 2020-06-16: 5 mg via ORAL

## 2020-06-16 MED ORDER — IOPAMIDOL (ISOVUE-M 200) INJECTION 41%: 1.0000 mL | Freq: Once | INTRAMUSCULAR | Status: DC

## 2020-06-16 NOTE — Discharge Instructions (Signed)
Post Procedure Spinal Discharge Instruction Sheet  1. You may resume a regular diet and any medications that you routinely take (including pain medications).  2. No driving day of procedure.  3. Light activity throughout the rest of the day.  Do not do any strenuous work, exercise, bending or lifting.  The day following the procedure, you can resume normal physical activity but you should refrain from exercising or physical therapy for at least three days thereafter.   Common Side Effects:   Headaches- take your usual medications as directed by your physician.  Increase your fluid intake.  Caffeinated beverages may be helpful.  Lie flat in bed until your headache resolves.   Restlessness or inability to sleep- you may have trouble sleeping for the next few days.  Ask your referring physician if you need any medication for sleep.   Facial flushing or redness- should subside within a few days.   Increased pain- a temporary increase in pain a day or two following your procedure is not unusual.  Take your pain medication as prescribed by your referring physician.   Leg cramps  Please contact our office at [phone] for the following symptoms:  Fever greater than 100 degrees.  Headaches unresolved with medication after 2-3 days.  Increased swelling, pain, or redness at injection site.   Thank you for visiting [facility] today.   YOU MAY RESUME YOUR PLAVIX ANYTIME AFTER PROCEDURE TODAY 06/16/20

## 2020-06-27 DIAGNOSIS — Z85828 Personal history of other malignant neoplasm of skin: Secondary | ICD-10-CM | POA: Diagnosis not present

## 2020-06-27 DIAGNOSIS — L821 Other seborrheic keratosis: Secondary | ICD-10-CM | POA: Diagnosis not present

## 2020-06-27 DIAGNOSIS — D692 Other nonthrombocytopenic purpura: Secondary | ICD-10-CM | POA: Diagnosis not present

## 2020-06-27 DIAGNOSIS — D2261 Melanocytic nevi of right upper limb, including shoulder: Secondary | ICD-10-CM | POA: Diagnosis not present

## 2020-06-27 DIAGNOSIS — L905 Scar conditions and fibrosis of skin: Secondary | ICD-10-CM | POA: Diagnosis not present

## 2020-06-27 DIAGNOSIS — L7211 Pilar cyst: Secondary | ICD-10-CM | POA: Diagnosis not present

## 2020-06-27 DIAGNOSIS — D1801 Hemangioma of skin and subcutaneous tissue: Secondary | ICD-10-CM | POA: Diagnosis not present

## 2020-06-27 DIAGNOSIS — L918 Other hypertrophic disorders of the skin: Secondary | ICD-10-CM | POA: Diagnosis not present

## 2020-07-20 DIAGNOSIS — Z01419 Encounter for gynecological examination (general) (routine) without abnormal findings: Secondary | ICD-10-CM | POA: Diagnosis not present

## 2020-07-20 DIAGNOSIS — Z6834 Body mass index (BMI) 34.0-34.9, adult: Secondary | ICD-10-CM | POA: Diagnosis not present

## 2020-08-04 DIAGNOSIS — H6980 Other specified disorders of Eustachian tube, unspecified ear: Secondary | ICD-10-CM | POA: Diagnosis not present

## 2020-08-17 DIAGNOSIS — I10 Essential (primary) hypertension: Secondary | ICD-10-CM | POA: Diagnosis not present

## 2020-08-17 DIAGNOSIS — B349 Viral infection, unspecified: Secondary | ICD-10-CM | POA: Diagnosis not present

## 2020-08-17 DIAGNOSIS — R0981 Nasal congestion: Secondary | ICD-10-CM | POA: Diagnosis not present

## 2020-08-17 DIAGNOSIS — I2510 Atherosclerotic heart disease of native coronary artery without angina pectoris: Secondary | ICD-10-CM | POA: Diagnosis not present

## 2020-08-17 DIAGNOSIS — R059 Cough, unspecified: Secondary | ICD-10-CM | POA: Diagnosis not present

## 2020-08-17 DIAGNOSIS — M810 Age-related osteoporosis without current pathological fracture: Secondary | ICD-10-CM | POA: Diagnosis not present

## 2020-08-17 DIAGNOSIS — D649 Anemia, unspecified: Secondary | ICD-10-CM | POA: Diagnosis not present

## 2020-08-17 DIAGNOSIS — R300 Dysuria: Secondary | ICD-10-CM | POA: Diagnosis not present

## 2020-08-22 DIAGNOSIS — M1711 Unilateral primary osteoarthritis, right knee: Secondary | ICD-10-CM | POA: Diagnosis not present

## 2020-08-23 ENCOUNTER — Telehealth: Payer: Self-pay | Admitting: Internal Medicine

## 2020-08-23 NOTE — Telephone Encounter (Signed)
Inbound call from patient requesting her latest lab work sent to Dr. Melinda Crutch at [facility].

## 2020-08-23 NOTE — Telephone Encounter (Signed)
Labs sent per pt request.

## 2020-08-24 DIAGNOSIS — I2510 Atherosclerotic heart disease of native coronary artery without angina pectoris: Secondary | ICD-10-CM | POA: Diagnosis not present

## 2020-08-24 DIAGNOSIS — I10 Essential (primary) hypertension: Secondary | ICD-10-CM | POA: Diagnosis not present

## 2020-08-24 DIAGNOSIS — M810 Age-related osteoporosis without current pathological fracture: Secondary | ICD-10-CM | POA: Diagnosis not present

## 2020-08-24 DIAGNOSIS — K219 Gastro-esophageal reflux disease without esophagitis: Secondary | ICD-10-CM | POA: Diagnosis not present

## 2020-08-24 DIAGNOSIS — Z0000 Encounter for general adult medical examination without abnormal findings: Secondary | ICD-10-CM | POA: Diagnosis not present

## 2020-08-24 DIAGNOSIS — H6980 Other specified disorders of Eustachian tube, unspecified ear: Secondary | ICD-10-CM | POA: Diagnosis not present

## 2020-08-25 NOTE — Telephone Encounter (Signed)
I can see lab results on KPN. Looks good. No additional labs needed with me  Pamela Fingerhut Martinique MD, [facility]

## 2020-08-29 ENCOUNTER — Other Ambulatory Visit: Payer: Self-pay | Admitting: Chiropractic Medicine

## 2020-08-29 ENCOUNTER — Telehealth: Payer: Self-pay | Admitting: *Deleted

## 2020-08-29 ENCOUNTER — Other Ambulatory Visit: Payer: Self-pay | Admitting: Cardiology

## 2020-08-29 DIAGNOSIS — M5416 Radiculopathy, lumbar region: Secondary | ICD-10-CM

## 2020-08-29 NOTE — Telephone Encounter (Signed)
Left a message for the patient to call back and speak to the on-call preop APP of today.  Note patient was cleared for the same procedure back in May and also cleared to hold Plavix for 5 days at that time.  As long as patient does not have any new anginal symptoms, she will be cleared again.

## 2020-08-29 NOTE — Telephone Encounter (Signed)
   [facility] Pre-operative Risk Assessment    Patient Name: Pamela Gonzales  DOB: August 29, 1930 MRN: [id number]  Request for surgical clearance:  What type of surgery is being performed? LUMBAR NERVE ROOT   When is this surgery scheduled? TBD  What type of clearance is required (medical clearance vs. Pharmacy clearance to hold med vs. Both)? PHARMACY   Are there any medications that need to be held prior to surgery and how long? PLAVIX FOR 5 DAYS   Practice name and name of physician performing surgery? [facility]   What is the office phone number? [phone]   7.   What is the office fax number? [phone]   8.   Anesthesia type (None, local, MAC, general) ?

## 2020-08-30 DIAGNOSIS — M1711 Unilateral primary osteoarthritis, right knee: Secondary | ICD-10-CM | POA: Diagnosis not present

## 2020-08-30 NOTE — Telephone Encounter (Signed)
   Name: Pamela Gonzales  DOB: [birth year]  MRN: [id number]   Primary Cardiologist: Peter Martinique, MD  Chart reviewed as part of pre-operative protocol coverage. Patient was contacted 08/30/2020 in reference to pre-operative risk assessment for pending surgery as outlined below.  Pamela Gonzales was last seen on 02/04/2020 by Dr. Martinique.  Since that day, Pamela Gonzales has done well without chest pain or worsening dyspnea.  She may hold plavix for 5-7 days prior to the nerve injection and restart as soon as possible afterward at the surgeon's discretion.   Therefore, based on ACC/AHA guidelines, the patient would be at acceptable risk for the planned procedure without further cardiovascular testing.   The patient was advised that if she develops new symptoms prior to surgery to contact our office to arrange for a follow-up visit, and she verbalized understanding.  I will route this recommendation to the requesting party via Epic fax function and remove from pre-op pool. Please call with questions.  [place], [place] 08/30/2020, 9:00 AM

## 2020-09-06 DIAGNOSIS — M1711 Unilateral primary osteoarthritis, right knee: Secondary | ICD-10-CM | POA: Diagnosis not present

## 2020-09-07 ENCOUNTER — Other Ambulatory Visit: Payer: Self-pay | Admitting: Cardiology

## 2020-09-08 ENCOUNTER — Other Ambulatory Visit: Payer: Self-pay | Admitting: Cardiology

## 2020-09-11 ENCOUNTER — Other Ambulatory Visit: Payer: Self-pay | Admitting: Chiropractic Medicine

## 2020-09-11 ENCOUNTER — Ambulatory Visit
Admission: RE | Admit: 2020-09-11 | Discharge: 2020-09-11 | Disposition: A | Discharge status: Home / Self Care | Payer: Medicare PPO | Source: Ambulatory Visit | Attending: Chiropractic Medicine | Admitting: Chiropractic Medicine

## 2020-09-11 DIAGNOSIS — M5416 Radiculopathy, lumbar region: Secondary | ICD-10-CM

## 2020-09-11 DIAGNOSIS — M47817 Spondylosis without myelopathy or radiculopathy, lumbosacral region: Secondary | ICD-10-CM | POA: Diagnosis not present

## 2020-09-11 MED ORDER — IOPAMIDOL (ISOVUE-M 200) INJECTION 41%
1.0000 mL | Freq: Once | INTRAMUSCULAR | Status: AC
  Administered 2020-09-11: 1 mL via EPIDURAL

## 2020-09-11 MED ORDER — METHYLPREDNISOLONE ACETATE 40 MG/ML INJ SUSP (RADIOLOG
80.0000 mg | Freq: Once | INTRAMUSCULAR | Status: AC
  Administered 2020-09-11: 80 mg via EPIDURAL

## 2020-09-11 MED ORDER — DIAZEPAM 5 MG PO TABS
5.0000 mg | ORAL_TABLET | Freq: Once | ORAL | Status: AC
  Administered 2020-09-11: 5 mg via ORAL

## 2020-09-11 NOTE — Discharge Instructions (Signed)

## 2020-09-17 NOTE — Progress Notes (Signed)
Pamela Gonzales Date of Birth: 07-Dec-1930 Medical Record [id number]  History of Present Illness: Pamela Gonzales is seen back today for follow up of CAD. She has a history of HLD, Lupus, GERD, OA, RBBB and CAD with past stents placed in 2005. She had stenting of the mid LAD and 2nd OM in 2005 with Cypher stents. Last Myoview was in June 2017 and was normal. She has remained on Plavix.   She did have surgery for a torn meniscus in her knee in February. Has since then had gel injections x 3 and fluid removed.  She is followed by Dr Hilarie Fredrickson for GERD, constipation and iron deficiency anemia felt to be partly due to [facility] lesions/hiatal hernia. Iron deficiency corrected with infusions.    On follow up today she is doing well from a cardiac standpoint.   She still  does water aerobics at least 3x/week. She has  neuropathy in her feet that limits her activity.  She denies any chest pain or SOB. She brings a diary of her BP readings and it has been well controlled generally.   Current Outpatient Medications  Medication Sig Dispense Refill   acetaminophen (TYLENOL) 500 MG tablet Take 500 mg by mouth 3 (three) times daily.     amLODipine (NORVASC) 5 MG tablet TAKE 1 TABLET BY MOUTH EVERY DAY 90 tablet 3   aspirin 81 MG tablet Take 81 mg by mouth daily.     clopidogrel (PLAVIX) 75 MG tablet TAKE 1 TABLET BY MOUTH EVERY DAY 90 tablet 3   Coenzyme Q10 (CO Q 10 PO) Take 200 mg by mouth daily.     dextromethorphan-guaiFENesin (MUCINEX DM) 30-600 MG 12hr tablet Take 2 tablets by mouth daily.     losartan (COZAAR) 100 MG tablet TAKE 1 TABLET BY MOUTH EVERY DAY 90 tablet 3   Multiple Vitamins-Minerals (MULTIVITAMIN WITH MINERALS) tablet Take 1 tablet by mouth daily.     MYRBETRIQ 50 MG TB24 tablet Take 50 mg by mouth daily.   2   NASONEX 50 MCG/ACT nasal spray 2 sprays by Nasal route Daily.     pantoprazole (PROTONIX) 40 MG tablet Take 1 tablet (40 mg total) by mouth daily. 90 tablet 1   rosuvastatin (CRESTOR) 40  MG tablet TAKE 1 TABLET BY MOUTH EVERY DAY 90 tablet 3   No current facility-administered medications for this visit.    Allergies  Allergen Reactions   Cephalexin Other (See Comments)    "terrible abdominal pain"   Tramadol Other (See Comments)    Per pt "hallucinations"    Lisinopril Cough   Oxycodone Itching   Penicillins Other (See Comments)    "Pt reports "knots on my arms"   Sulfa Drugs Cross Reactors Other (See Comments)    Skin irritation.    Past Medical History:  Diagnosis Date   Anemia    Arthritis    Asthmatic bronchitis    Atherosclerotic heart disease of native coronary artery without angina pectoris    Benign fundic gland polyps of stomach    CAD (coronary artery disease) 05   stents   Daytime somnolence    Diverticulosis    Esophageal dysmotility    Fibula fracture    right   Gallstones    Gastric erosions    GERD (gastroesophageal reflux disease)    Hiatal hernia    Hypercholesteremia    Hypertension    Internal hemorrhoids    Joint stiffness    Lupus (HCC)    Osteoarthritis  Osteoporosis    Paresthesia    Peripheral neuropathy    Peripheral neuropathy    RBBB    Reactive airways dysfunction syndrome (HCC)    Sciatica    Seasonal allergies    Spinal stenosis    Tubular adenoma of colon     Past Surgical History:  Procedure Laterality Date   ABDOMINAL HYSTERECTOMY  1975   BREAST EXCISIONAL BIOPSY Right    CATARACT EXTRACTION, BILATERAL     CHOLECYSTECTOMY  07/04/2011   Procedure: LAPAROSCOPIC CHOLECYSTECTOMY WITH INTRAOPERATIVE CHOLANGIOGRAM;  Surgeon: Merrie Roof, MD;  Location: [facility];  Service: General;  Laterality: N/A;   COLONOSCOPY W/ BIOPSIES     CORONARY ANGIOPLASTY  2005   ESOPHAGOGASTRODUODENOSCOPY (EGD) WITH PROPOFOL N/A 06/11/2016   Procedure: ESOPHAGOGASTRODUODENOSCOPY (EGD) WITH PROPOFOL;  Surgeon: Garlan Fair, MD;  Location: [facility];  Service: Endoscopy;  Laterality: N/A;   FIBULA FRACTURE SURGERY  2011    Right Ankle   FLEXIBLE SIGMOIDOSCOPY N/A 07/09/2016   Procedure: FLEXIBLE SIGMOIDOSCOPY;  Surgeon: Garlan Fair, MD;  Location: [facility];  Service: Endoscopy;  Laterality: N/A;   KNEE ARTHROSCOPY Right 03/15/2019   Procedure: Right knee arthroscopy; meniscal debridement chondroplasty;  Surgeon: Gaynelle Arabian, MD;  Location: [facility];  Service: Orthopedics;  Laterality: Right;  66mn    Social History   Tobacco Use  Smoking Status Never  Smokeless Tobacco Never    Social History   Substance and Sexual Activity  Alcohol Use Yes   Comment: very rare    Family History  Problem Relation Age of Onset   Lung cancer Father    Heart attack Mother    Heart attack Brother    Heart disease Brother    Rectal cancer Brother        only 1 kidney   Cancer Sister    Pancreatic cancer Sister    Heart disease Sister    Anesthesia problems Neg Hx    Hypotension Neg Hx    Malignant hyperthermia Neg Hx    Pseudochol deficiency Neg Hx    Breast cancer Neg Hx    Colon cancer Neg Hx    Stomach cancer Neg Hx     Review of Systems: The review of systems is per the HPI.  All other systems were reviewed and are negative.  Physical Exam: BP 132/80   Pulse 80   Resp 20   Ht '5\' 4"'$  (1.626 m)   Wt 197 lb 6.4 oz (89.5 kg)   SpO2 97%   BMI 33.88 kg/m  GENERAL:  Well appearing, obese WF in NAD HEENT:  PERRL, EOMI, sclera are clear. Oropharynx is clear. NECK:  No jugular venous distention, carotid upstroke brisk and symmetric, no bruits, no thyromegaly or adenopathy LUNGS:  Clear to auscultation bilaterally CHEST:  Unremarkable HEART:  RRR,  PMI not displaced or sustained,S1 and S2 within normal limits, no S3, no S4: no clicks, no rubs, no murmurs EXT:  2 + pulses throughout, no edema, no cyanosis no clubbing SKIN:  Warm and dry.  No rashes NEURO:  Alert and oriented x 3. Cranial nerves II through XII intact. PSYCH:  Cognitively intact  LABORATORY DATA:  Lab Results  Component  Value Date   WBC 7.0 06/05/2020   HGB 12.8 06/05/2020   HCT 38.2 06/05/2020   PLT 219.0 06/05/2020   GLUCOSE 162 (H) 06/05/2020   CHOL 166 07/20/2019   TRIG 118 07/20/2019   HDL 62 07/20/2019   LDLCALC 83 07/20/2019  ALT 15 07/20/2019   AST 24 07/20/2019   NA 140 06/05/2020   K 3.8 06/05/2020   CL 106 06/05/2020   CREATININE 0.78 06/05/2020   BUN 18 06/05/2020   CO2 27 06/05/2020   INR 1.07 08/03/2009   HGBA1C 5.4 05/10/2010   Dated 08/17/20: cholesterol 155, triglycerides 123, HDL 55, LDL 78. CMET and CBC normal   Ecg today shows NSR with first degree AV block and RBBB. I have personally reviewed and interpreted this study.  Myoview study 07/26/15:Study Highlights   The left ventricular ejection fraction is hyperdynamic (>65%). Nuclear stress EF: 72%. There was no ST segment deviation noted during stress. The study is normal. This is a low risk study.   This is a normal pharmacologic nuclear study with no evidence of prior infarct or ischemia. Normal LVEF.        Assessment / Plan: 1. CAD - s/p stenting of the LAD and OM in 2005 with first generation Cypher stents. Normal Myoview in June 2017.  She is asymptomatic. Since she has first generation DES we have continued  DAPT indefinitely.   2. HTN - BP is well controlled.   3. HLD - on statin. LDL 78. Goal 70. On maximal dose of Crestor. Per prior discussion she would like to avoid additional medical therapy.   4. Chronic RBBB since 2014.    5. Peripheral neuropathy  6. Iron deficiency anemia. Improved.   I will follow up in 6 months

## 2020-09-21 ENCOUNTER — Encounter: Payer: Self-pay | Admitting: Cardiology

## 2020-09-21 ENCOUNTER — Ambulatory Visit: Payer: Medicare PPO | Admitting: Cardiology

## 2020-09-21 ENCOUNTER — Other Ambulatory Visit: Payer: Self-pay

## 2020-09-21 VITALS — BP 132/80 | HR 80 | Resp 20 | Ht 64.0 in | Wt 197.4 lb

## 2020-09-21 DIAGNOSIS — I4510 Unspecified right bundle-branch block: Secondary | ICD-10-CM

## 2020-09-21 DIAGNOSIS — I10 Essential (primary) hypertension: Secondary | ICD-10-CM | POA: Diagnosis not present

## 2020-09-21 DIAGNOSIS — I2510 Atherosclerotic heart disease of native coronary artery without angina pectoris: Secondary | ICD-10-CM | POA: Diagnosis not present

## 2020-09-21 MED ORDER — LOSARTAN POTASSIUM 100 MG PO TABS: 100.0000 mg | ORAL_TABLET | Freq: Every day | ORAL | 3 refills | Status: DC

## 2020-10-11 DIAGNOSIS — R051 Acute cough: Secondary | ICD-10-CM | POA: Diagnosis not present

## 2020-10-11 DIAGNOSIS — Z20822 Contact with and (suspected) exposure to covid-19: Secondary | ICD-10-CM | POA: Diagnosis not present

## 2020-10-11 DIAGNOSIS — J069 Acute upper respiratory infection, unspecified: Secondary | ICD-10-CM | POA: Diagnosis not present

## 2020-10-11 DIAGNOSIS — R519 Headache, unspecified: Secondary | ICD-10-CM | POA: Diagnosis not present

## 2020-10-11 DIAGNOSIS — B338 Other specified viral diseases: Secondary | ICD-10-CM | POA: Diagnosis not present

## 2020-10-13 DIAGNOSIS — J40 Bronchitis, not specified as acute or chronic: Secondary | ICD-10-CM | POA: Diagnosis not present

## 2020-10-16 DIAGNOSIS — J209 Acute bronchitis, unspecified: Secondary | ICD-10-CM | POA: Diagnosis not present

## 2020-10-16 DIAGNOSIS — B338 Other specified viral diseases: Secondary | ICD-10-CM | POA: Diagnosis not present

## 2020-10-16 DIAGNOSIS — R051 Acute cough: Secondary | ICD-10-CM | POA: Diagnosis not present

## 2020-10-18 ENCOUNTER — Other Ambulatory Visit: Payer: Self-pay | Admitting: Family Medicine

## 2020-10-18 ENCOUNTER — Other Ambulatory Visit: Payer: Self-pay

## 2020-10-18 ENCOUNTER — Ambulatory Visit
Admission: RE | Admit: 2020-10-18 | Discharge: 2020-10-18 | Disposition: A | Discharge status: Home / Self Care | Payer: Medicare PPO | Source: Ambulatory Visit | Attending: Family Medicine | Admitting: Family Medicine

## 2020-10-18 DIAGNOSIS — R059 Cough, unspecified: Secondary | ICD-10-CM | POA: Diagnosis not present

## 2020-10-30 DIAGNOSIS — H6122 Impacted cerumen, left ear: Secondary | ICD-10-CM | POA: Diagnosis not present

## 2020-10-30 DIAGNOSIS — H9202 Otalgia, left ear: Secondary | ICD-10-CM | POA: Diagnosis not present

## 2020-11-15 DIAGNOSIS — H9202 Otalgia, left ear: Secondary | ICD-10-CM | POA: Diagnosis not present

## 2020-12-06 DIAGNOSIS — M5416 Radiculopathy, lumbar region: Secondary | ICD-10-CM | POA: Diagnosis not present

## 2020-12-14 ENCOUNTER — Telehealth: Payer: Self-pay

## 2020-12-14 ENCOUNTER — Other Ambulatory Visit: Payer: Self-pay | Admitting: Chiropractic Medicine

## 2020-12-14 ENCOUNTER — Other Ambulatory Visit: Payer: Self-pay | Admitting: Cardiology

## 2020-12-14 DIAGNOSIS — M5416 Radiculopathy, lumbar region: Secondary | ICD-10-CM

## 2020-12-14 NOTE — Telephone Encounter (Signed)
   [facility] Pre-operative Risk Assessment    Patient Name: ELIDIA BONENFANT  DOB: Jul 25, 1930 MRN: [id number]  HEARTCARE STAFF:  - IMPORTANT!!!!!! Under Visit Info/Reason for Call, type in Other and utilize the format Clearance MM/DD/YY or Clearance TBD. Do not use dashes or single digits. - Please review there is not already an duplicate clearance open for this procedure. - If request is for dental extraction, please clarify the # of teeth to be extracted. - If the patient is currently at the dentist's office, call Pre-Op Callback Staff (MA/nurse) to input urgent request.  - If the patient is not currently in the dentist office, please route to the Pre-Op pool.  Request for surgical clearance:  What type of surgery is being performed? Lumbar Nerve Root Block   When is this surgery scheduled? TBD  What type of clearance is required (medical clearance vs. Pharmacy clearance to hold med vs. Both)? Medical  Are there any medications that need to be held prior to surgery and how long? Plavix 5 days  Practice name and name of physician performing surgery? [facility]   What is the office phone number? [phone]   7.   What is the office fax number? [phone]  8.   Anesthesia type (None, local, MAC, general) ? Unknown    Zebedee Iba 12/14/2020, 4:28 PM  _________________________________________________________________   (provider comments below)

## 2020-12-14 NOTE — Telephone Encounter (Signed)
   Primary Cardiologist: Peter Martinique, MD  Chart reviewed as part of pre-operative protocol coverage. Given past medical history and time since last visit, based on ACC/AHA guidelines, Pamela Gonzales would be at acceptable risk for the planned procedure without further cardiovascular testing.   Her Plavix may be held for 5  days prior to her procedure.  Please resume as soon as possible after the procedure at the surgeon's discretion.  I will route this recommendation to the requesting party via Epic fax function and remove from pre-op pool.  Please call with questions.  Jossie Ng. Dabney Dever NP-C    12/14/2020, 4:39 PM [address] Office [phone] Fax [phone]

## 2020-12-14 NOTE — Telephone Encounter (Signed)
This encounter was created in error - please disregard.

## 2020-12-28 ENCOUNTER — Ambulatory Visit: Payer: Medicare PPO | Admitting: Internal Medicine

## 2020-12-28 ENCOUNTER — Other Ambulatory Visit (INDEPENDENT_AMBULATORY_CARE_PROVIDER_SITE_OTHER): Payer: Medicare PPO

## 2020-12-28 ENCOUNTER — Encounter: Payer: Self-pay | Admitting: Internal Medicine

## 2020-12-28 VITALS — BP 140/72 | HR 82 | Ht 64.0 in | Wt 200.0 lb

## 2020-12-28 DIAGNOSIS — D500 Iron deficiency anemia secondary to blood loss (chronic): Secondary | ICD-10-CM | POA: Diagnosis not present

## 2020-12-28 DIAGNOSIS — D509 Iron deficiency anemia, unspecified: Secondary | ICD-10-CM

## 2020-12-28 DIAGNOSIS — K219 Gastro-esophageal reflux disease without esophagitis: Secondary | ICD-10-CM | POA: Diagnosis not present

## 2020-12-28 DIAGNOSIS — K5900 Constipation, unspecified: Secondary | ICD-10-CM

## 2020-12-28 DIAGNOSIS — K449 Diaphragmatic hernia without obstruction or gangrene: Secondary | ICD-10-CM | POA: Diagnosis not present

## 2020-12-28 LAB — CBC
HCT: 37.2 % (ref 36.0–46.0)
Hemoglobin: 12.1 g/dL (ref 12.0–15.0)
MCHC: 32.5 g/dL (ref 30.0–36.0)
MCV: 88.7 fl (ref 78.0–100.0)
Platelets: 255 10*3/uL (ref 150.0–400.0)
RBC: 4.2 Mil/uL (ref 3.87–5.11)
RDW: 14.8 % (ref 11.5–15.5)
WBC: 7.7 10*3/uL (ref 4.0–10.5)

## 2020-12-28 LAB — IBC + FERRITIN
Ferritin: 11.3 ng/mL (ref 10.0–291.0)
Iron: 41 ug/dL — ABNORMAL LOW (ref 42–145)
Saturation Ratios: 9.4 % — ABNORMAL LOW (ref 20.0–50.0)
TIBC: 436.8 ug/dL (ref 250.0–450.0)
Transferrin: 312 mg/dL (ref 212.0–360.0)

## 2020-12-28 MED ORDER — PANTOPRAZOLE SODIUM 40 MG PO TBEC: 40.0000 mg | DELAYED_RELEASE_TABLET | Freq: Every day | ORAL | 1 refills | Status: DC

## 2020-12-28 NOTE — Patient Instructions (Signed)
Your provider has requested that you go to the basement level for lab work before leaving today. Press "B" on the elevator. The lab is located at the first door on the left as you exit the elevator.  We have sent the following medications to your pharmacy for you to pick up at your convenience: Pantoprazole 40 mg 30 minutes before breakfast meal.  Please purchase the following medications over the counter and take as directed: Miralax 17 grams daily dissolved in at least 8 ounces water/juice. You may titrate as needed based on how your bowels do with this medication. ____________________________________________________  If you are age 85 or older, your body mass index should be between 23-30. Your Body mass index is 34.33 kg/m. If this is out of the aforementioned range listed, please consider follow up with your Primary Care Provider.  If you are age 85 or younger, your body mass index should be between 19-25. Your Body mass index is 34.33 kg/m. If this is out of the aformentioned range listed, please consider follow up with your Primary Care Provider.   ________________________________________________________  The [facility] providers would like to encourage you to use [facility] to communicate with providers for non-urgent requests or questions.  Due to long hold times on the telephone, sending your provider a message by [facility] may be a faster and more efficient way to get a response.  Please allow 48 business hours for a response.  Please remember that this is for non-urgent requests.  _______________________________________________________  Due to recent changes in healthcare laws, you may see the results of your imaging and laboratory studies on MyChart before your provider has had a chance to review them.  We understand that in some cases there may be results that are confusing or concerning to you. Not all laboratory results come back in the same time frame and the provider may be waiting  for multiple results in order to interpret others.  Please give [place] 48 hours in order for your provider to thoroughly review all the results before contacting the office for clarification of your results.

## 2020-12-28 NOTE — Progress Notes (Signed)
Subjective:    Patient ID: Pamela Gonzales, female    DOB: 1930-07-08, 87 y.o.   MRN: [id number]  HPI Pamela Gonzales is an 87 year old female with a past medical history of GERD with complex hiatal hernia and Cameron's erosions, IDA felt secondary to hiatal hernia, history of gastritis, chronic constipation, diverticulosis and remote colon polyps who is here for follow-up.  She was last seen here in February 2022 and she is here alone today.  She reports that she continues to have issues with "lazy rectum" which for her means constipation and infrequent stool.  Stools can be hard requiring straining to pass.  It also makes her feel abdominal pressure and bloating symptom when she is not having regular bowel movements.  She states it makes her "grumpy".  She uses [facility] for hemorrhoidal irritation and discomfort associated with straining.  She seen no blood in stool or melena.  She was trying probiotic yogurt and high-fiber foods but most recently has started MiraLAX and feels that this works very well for her.  She wonders if she should try it on a daily basis.  No heartburn or trouble swallowing.  She continues her pantoprazole.  She remains on Plavix.  She remains active and doing water aerobics most days a week.   Review of Systems As per HPI, otherwise negative  Current Medications, Allergies, Past Medical History, Past Surgical History, Family History and Social History were reviewed in [employer] record.    Objective:   Physical Exam BP 140/72   Pulse 82   Ht 5\' 4"  (1.626 m)   Wt 200 lb (90.7 kg)   BMI 34.33 kg/m  Gen: awake, alert, NAD HEENT: anicteric  CV: RRR, no mrg Pulm: CTA b/l Abd: soft, NT/ND, +BS throughout Ext: no c/c/e Neuro: nonfocal  CBC    Component Value Date/Time   WBC 7.0 06/05/2020 1401   RBC 4.13 06/05/2020 1401   HGB 12.8 06/05/2020 1401   HGB 13.4 06/24/2018 0827   HCT 38.2 06/05/2020 1401   HCT 40.5 06/24/2018 0827   PLT  219.0 06/05/2020 1401   PLT 204 06/24/2018 0827   MCV 92.4 06/05/2020 1401   MCV 96 06/24/2018 0827   MCH 32.3 03/10/2019 1133   MCHC 33.6 06/05/2020 1401   RDW 13.5 06/05/2020 1401   RDW 13.1 06/24/2018 0827   LYMPHSABS 1.6 06/05/2020 1401   LYMPHSABS 1.2 06/24/2018 0827   MONOABS 0.7 06/05/2020 1401   EOSABS 0.2 06/05/2020 1401   EOSABS 0.6 (H) 06/24/2018 0827   BASOSABS 0.0 06/05/2020 1401   BASOSABS 0.1 06/24/2018 0827   Iron/TIBC/Ferritin/ %Sat    Component Value Date/Time   IRON 72 06/05/2020 1401   FERRITIN 12.1 06/05/2020 1401   IRONPCTSAT 17.2 (L) 06/05/2020 1401        Assessment & Plan:  87 year old female with a past medical history of GERD with complex hiatal hernia and Cameron's erosions, IDA felt secondary to hiatal hernia, history of gastritis, chronic constipation, diverticulosis and remote colon polyps who is here for follow-up.   Constipation/dyssynergy defecation --I have recommended she had MiraLAX daily on an ongoing a chronic basis.  We discussed dose titration and how it would be acceptable to use this twice daily but if stools are too loose or frequent she can back off to every other day.  Continue high-fiber food with liberal hydration. --MiraLAX 17 g daily, titrated for effect  2.  GERD with hiatal hernia and history of Cameron's erosions --remains  asymptomatic on pantoprazole.  Given her history of IDA associated with erosions related to hiatal hernia I recommended ongoing PPI --Continue pantoprazole 40 mg daily  2.  IDA secondary to #2 --she has received IV iron in the past though iron studies have been borderline of late.  She has been hesitant for oral iron given constipation. --Repeat CBC, IBC plus ferritin today  58-month follow-up, sooner if needed  30 minutes total spent today including patient facing time, coordination of care, reviewing medical history/procedures/pertinent radiology studies, and documentation of the encounter.

## 2020-12-29 ENCOUNTER — Other Ambulatory Visit: Payer: Self-pay

## 2020-12-29 ENCOUNTER — Other Ambulatory Visit: Payer: Self-pay | Admitting: Internal Medicine

## 2020-12-29 ENCOUNTER — Telehealth: Payer: Self-pay | Admitting: Pharmacy Technician

## 2020-12-29 DIAGNOSIS — E611 Iron deficiency: Secondary | ICD-10-CM

## 2020-12-29 DIAGNOSIS — D500 Iron deficiency anemia secondary to blood loss (chronic): Secondary | ICD-10-CM

## 2020-12-29 NOTE — Telephone Encounter (Signed)
Dr. Aundra Millet Submission: Pamela Gonzales - DENIED Payer: Mcarthur Rossetti Medication & CPT/J Code(s) submitted: Feraheme (ferumoxytol) [phone] Route of submission (phone, fax, portal): PHONE Auth type: Buy/Bill Units/visits requested: 2 Reference number: [id number] Denied due to patient has not tried and or failed step therapy. Venofer Infed Ferrlicit  Would you like to try Venofer?  Please advise?? Maudie Mercury

## 2020-12-29 NOTE — Telephone Encounter (Signed)
I received the patient's MyChart message  We will repeat Feraheme 510 mg x 1 Last dose was in January 2020  I will order this through the infusion center  Please make patient aware  Lets check her CBC, IBC plus ferritin 3 months after her infusion  Thank you

## 2021-01-02 ENCOUNTER — Other Ambulatory Visit: Payer: Self-pay | Admitting: Pharmacy Technician

## 2021-01-02 NOTE — Telephone Encounter (Addendum)
Dr. Raquel James.  Recommended dose would be Venofer 200 mg x 2 doses.  If agreeable we will place the therapy plan and schedule her as soon as possible. Pamela Gonzales

## 2021-01-02 NOTE — Telephone Encounter (Signed)
Thank you very much, yes it is okay to change to Venofer 200 mg x 2 doses for iron deficiency

## 2021-01-05 ENCOUNTER — Ambulatory Visit
Admission: RE | Admit: 2021-01-05 | Discharge: 2021-01-05 | Disposition: A | Discharge status: Home / Self Care | Payer: Medicare PPO | Source: Ambulatory Visit | Attending: Chiropractic Medicine | Admitting: Chiropractic Medicine

## 2021-01-05 ENCOUNTER — Other Ambulatory Visit: Payer: Self-pay | Admitting: Chiropractic Medicine

## 2021-01-05 ENCOUNTER — Other Ambulatory Visit: Payer: Self-pay

## 2021-01-05 DIAGNOSIS — M5416 Radiculopathy, lumbar region: Secondary | ICD-10-CM

## 2021-01-05 DIAGNOSIS — M4727 Other spondylosis with radiculopathy, lumbosacral region: Secondary | ICD-10-CM | POA: Diagnosis not present

## 2021-01-05 MED ORDER — IOPAMIDOL (ISOVUE-M 200) INJECTION 41%
1.0000 mL | Freq: Once | INTRAMUSCULAR | Status: AC
  Administered 2021-01-05: 1 mL via EPIDURAL

## 2021-01-05 MED ORDER — METHYLPREDNISOLONE ACETATE 40 MG/ML INJ SUSP (RADIOLOG
80.0000 mg | Freq: Once | INTRAMUSCULAR | Status: AC
  Administered 2021-01-05: 80 mg via EPIDURAL

## 2021-01-05 MED ORDER — DIAZEPAM 5 MG PO TABS
5.0000 mg | ORAL_TABLET | Freq: Once | ORAL | Status: AC
  Administered 2021-01-05: 5 mg via ORAL

## 2021-01-05 NOTE — Discharge Instructions (Signed)
Post Procedure Spinal Discharge Instruction Sheet  You may resume a regular diet and any medications that you routinely take (including pain medications) unless otherwise noted by MD.  No driving day of procedure.  Light activity throughout the rest of the day.  Do not do any strenuous work, exercise, bending or lifting.  The day following the procedure, you can resume normal physical activity but you should refrain from exercising or physical therapy for at least three days thereafter.  You may apply ice to the injection site, 20 minutes on, 20 minutes off, as needed. Do not apply ice directly to skin.    Common Side Effects:  Headaches- take your usual medications as directed by your physician.  Increase your fluid intake.  Caffeinated beverages may be helpful.  Lie flat in bed until your headache resolves.  Restlessness or inability to sleep- you may have trouble sleeping for the next few days.  Ask your referring physician if you need any medication for sleep.  Facial flushing or redness- should subside within a few days.  Increased pain- a temporary increase in pain a day or two following your procedure is not unusual.  Take your pain medication as prescribed by your referring physician.  Leg cramps  Please contact our office at [phone] for the following symptoms: Fever greater than 100 degrees. Headaches unresolved with medication after 2-3 days. Increased swelling, pain, or redness at injection site.   Thank you for visiting [facility] today.    YOU MAY RESUME YOUR ASPIRIN AND PLAVIX ANYTIME AFTER INJECTION TODAY.

## 2021-01-09 DIAGNOSIS — M1711 Unilateral primary osteoarthritis, right knee: Secondary | ICD-10-CM | POA: Diagnosis not present

## 2021-01-11 ENCOUNTER — Encounter: Payer: Self-pay | Admitting: Cardiology

## 2021-01-18 DIAGNOSIS — Z961 Presence of intraocular lens: Secondary | ICD-10-CM | POA: Diagnosis not present

## 2021-01-23 ENCOUNTER — Ambulatory Visit (INDEPENDENT_AMBULATORY_CARE_PROVIDER_SITE_OTHER): Payer: Medicare PPO

## 2021-01-23 ENCOUNTER — Other Ambulatory Visit: Payer: Self-pay

## 2021-01-23 VITALS — BP 172/79 | HR 90 | Temp 97.6°F | Resp 20 | Ht 64.0 in | Wt 194.0 lb

## 2021-01-23 DIAGNOSIS — D500 Iron deficiency anemia secondary to blood loss (chronic): Secondary | ICD-10-CM

## 2021-01-23 DIAGNOSIS — E611 Iron deficiency: Secondary | ICD-10-CM

## 2021-01-23 MED ORDER — FAMOTIDINE IN NACL 20-0.9 MG/50ML-% IV SOLN: 20.0000 mg | Freq: Once | INTRAVENOUS | Status: DC | PRN

## 2021-01-23 MED ORDER — ACETAMINOPHEN 325 MG PO TABS
650.0000 mg | ORAL_TABLET | Freq: Once | ORAL | Status: AC
  Administered 2021-01-23: 11:00:00 650 mg via ORAL
  Filled 2021-01-23: qty 2

## 2021-01-23 MED ORDER — DIPHENHYDRAMINE HCL 50 MG/ML IJ SOLN: 50.0000 mg | Freq: Once | INTRAMUSCULAR | Status: DC | PRN

## 2021-01-23 MED ORDER — ALBUTEROL SULFATE HFA 108 (90 BASE) MCG/ACT IN AERS: 2.0000 | INHALATION_SPRAY | Freq: Once | RESPIRATORY_TRACT | Status: DC | PRN

## 2021-01-23 MED ORDER — SODIUM CHLORIDE 0.9 % IV SOLN: Freq: Once | INTRAVENOUS | Status: DC | PRN

## 2021-01-23 MED ORDER — METHYLPREDNISOLONE SODIUM SUCC 125 MG IJ SOLR: 125.0000 mg | Freq: Once | INTRAMUSCULAR | Status: DC | PRN

## 2021-01-23 MED ORDER — DIPHENHYDRAMINE HCL 25 MG PO CAPS
50.0000 mg | ORAL_CAPSULE | Freq: Once | ORAL | Status: AC
  Administered 2021-01-23: 11:00:00 50 mg via ORAL
  Filled 2021-01-23: qty 2

## 2021-01-23 MED ORDER — SODIUM CHLORIDE 0.9 % IV SOLN
200.0000 mg | Freq: Once | INTRAVENOUS | Status: AC
  Administered 2021-01-23: 11:00:00 200 mg via INTRAVENOUS
  Filled 2021-01-23: qty 10

## 2021-01-23 MED ORDER — EPINEPHRINE 0.3 MG/0.3ML IJ SOAJ: 0.3000 mg | Freq: Once | INTRAMUSCULAR | Status: DC | PRN

## 2021-01-23 NOTE — Progress Notes (Signed)
Diagnosis: Iron Deficiency Anemia  Provider:  Marshell Garfinkel, MD  Procedure: Infusion  IV Type: Peripheral, IV Location: L Hand  Venofer (Iron Sucrose), Dose: 200 mg  Infusion Start Time: 4332  Infusion Stop Time: 1125  Post Infusion IV Care: Observation period completed and Peripheral IV Discontinued  Discharge: Condition: Good, Destination: Home . AVS provided to patient.   Performed by:  Ibraham Levi, Sherlon Handing, LPN

## 2021-01-24 ENCOUNTER — Encounter: Payer: Self-pay | Admitting: Internal Medicine

## 2021-01-25 ENCOUNTER — Ambulatory Visit (INDEPENDENT_AMBULATORY_CARE_PROVIDER_SITE_OTHER): Payer: Medicare PPO

## 2021-01-25 ENCOUNTER — Other Ambulatory Visit: Payer: Self-pay

## 2021-01-25 VITALS — BP 147/75 | HR 75 | Temp 98.0°F | Resp 16

## 2021-01-25 DIAGNOSIS — E611 Iron deficiency: Secondary | ICD-10-CM

## 2021-01-25 MED ORDER — ALBUTEROL SULFATE HFA 108 (90 BASE) MCG/ACT IN AERS: 2.0000 | INHALATION_SPRAY | Freq: Once | RESPIRATORY_TRACT | Status: DC | PRN

## 2021-01-25 MED ORDER — DIPHENHYDRAMINE HCL 50 MG/ML IJ SOLN: 50.0000 mg | Freq: Once | INTRAMUSCULAR | Status: DC | PRN

## 2021-01-25 MED ORDER — SODIUM CHLORIDE 0.9 % IV SOLN
200.0000 mg | Freq: Once | INTRAVENOUS | Status: AC
  Administered 2021-01-25: 11:00:00 200 mg via INTRAVENOUS
  Filled 2021-01-25: qty 10

## 2021-01-25 MED ORDER — SODIUM CHLORIDE 0.9 % IV SOLN: Freq: Once | INTRAVENOUS | Status: DC | PRN

## 2021-01-25 MED ORDER — METHYLPREDNISOLONE SODIUM SUCC 125 MG IJ SOLR: 125.0000 mg | Freq: Once | INTRAMUSCULAR | Status: DC | PRN

## 2021-01-25 MED ORDER — EPINEPHRINE 0.3 MG/0.3ML IJ SOAJ: 0.3000 mg | Freq: Once | INTRAMUSCULAR | Status: DC | PRN

## 2021-01-25 MED ORDER — FAMOTIDINE IN NACL 20-0.9 MG/50ML-% IV SOLN: 20.0000 mg | Freq: Once | INTRAVENOUS | Status: DC | PRN

## 2021-01-25 MED ORDER — DIPHENHYDRAMINE HCL 25 MG PO CAPS
50.0000 mg | ORAL_CAPSULE | Freq: Once | ORAL | Status: AC
  Administered 2021-01-25: 11:00:00 25 mg via ORAL
  Filled 2021-01-25: qty 2

## 2021-01-25 MED ORDER — ACETAMINOPHEN 325 MG PO TABS
650.0000 mg | ORAL_TABLET | Freq: Once | ORAL | Status: AC
  Administered 2021-01-25: 11:00:00 650 mg via ORAL
  Filled 2021-01-25: qty 2

## 2021-01-25 NOTE — Progress Notes (Signed)
Diagnosis: Iron Deficiency Anemia  Provider:  Marshell Garfinkel, MD  Procedure: Infusion  IV Type: Peripheral, IV Location: L Hand  Venofer (Iron Sucrose), Dose: 200 mg  Infusion Start Time: 1100  Infusion Stop Time: 1117  Post Infusion IV Care: Peripheral IV Discontinued  Discharge: Condition: Good, Destination: Home . AVS provided to patient.   Performed by:  Paul Dykes, RN

## 2021-02-05 ENCOUNTER — Other Ambulatory Visit: Payer: Self-pay | Admitting: Obstetrics and Gynecology

## 2021-02-05 DIAGNOSIS — Z1231 Encounter for screening mammogram for malignant neoplasm of breast: Secondary | ICD-10-CM

## 2021-02-07 DIAGNOSIS — N3946 Mixed incontinence: Secondary | ICD-10-CM | POA: Diagnosis not present

## 2021-02-07 DIAGNOSIS — R350 Frequency of micturition: Secondary | ICD-10-CM | POA: Diagnosis not present

## 2021-02-20 ENCOUNTER — Other Ambulatory Visit: Payer: Self-pay | Admitting: Chiropractic Medicine

## 2021-02-20 DIAGNOSIS — M5416 Radiculopathy, lumbar region: Secondary | ICD-10-CM

## 2021-02-21 DIAGNOSIS — M170 Bilateral primary osteoarthritis of knee: Secondary | ICD-10-CM | POA: Diagnosis not present

## 2021-03-03 ENCOUNTER — Other Ambulatory Visit: Payer: Self-pay | Admitting: Cardiology

## 2021-03-06 ENCOUNTER — Telehealth: Payer: Self-pay | Admitting: Cardiology

## 2021-03-06 NOTE — Telephone Encounter (Signed)
Patient states that she is having back injection- clearance was entered in chart on 12/14/20- patient states the time has not been determined yet. Patient states she needs to hold her plavix for 5 days prior but states that [facility] imaging has not received any clearance yet therefore she can't be scheduled. Patient would like to know if pre op team can reevaluate and fax over to [facility] once done. Advised patient I would forward her message over to pre op team. Patient aware and verbalized understanding.

## 2021-03-06 NOTE — Telephone Encounter (Signed)
Patient called stating she needs to be off of Plavix for 5 days.  She needs it sent to [facility] at [address].

## 2021-03-06 NOTE — Telephone Encounter (Signed)
° °  Patient Name: Pamela Gonzales  DOB: [birth year] MRN: [id number]  Primary Cardiologist: Peter Martinique, MD  Chart reviewed as part of pre-operative protocol coverage. Last seen by Dr. Martinique 08/2020, history reviewed.   Per notes below, [facility] did not receive prior clearance from 11/2020 request. Reviewed prior clearance from 11/2020 which states: Given past medical history and time since last visit, based on ACC/AHA guidelines, Pamela Gonzales would be at acceptable risk for the planned procedure without further cardiovascular testing.    Her Plavix may be held for 5  days prior to her procedure.  Please resume as soon as possible after the procedure at the surgeon's discretion.   I will route this recommendation to the requesting party via Epic fax function and remove from pre-op pool.   Please call with questions.   Jossie Ng. Cleaver NP-C 12/14/2020, 4:39 PM   I reached out to patient for update on how she is doing. She confirms this call was for clearance of the same procedure requested in November 2022 which was a lumbar nerve root block. The patient affirms she has been doing well without any new cardiac symptoms. She is still doing water aerobics 3x/week without CP or SOB. Therefore, based on ACC/AHA guidelines, the patient would be at acceptable risk for the planned procedure without further cardiovascular testing. The patient was advised that if she develops new symptoms prior to surgery to contact our office to arrange for a follow-up visit, and she verbalized understanding. The clearance to hold Plavix for 5 days was based on a prior recommendation from Dr. Martinique in 2021. Given no interim clinical changes, the same recommendation applies.  I do see where Coletta Memos routed the previous clearance to [facility] in November but this phone call states they did not receive it so will route to [facility] team to clarify fax # and ensure clearance is routed where it needs to  go. Please call with questions.   Charlie Pitter, PA-C 03/06/2021, 2:38 PM

## 2021-03-07 ENCOUNTER — Telehealth: Payer: Self-pay

## 2021-03-07 NOTE — Telephone Encounter (Signed)
° °  Pre-operative Risk Assessment    Patient Name: Pamela Gonzales  DOB: [birth year] MRN: [id number]      Request for Surgical Clearance    Procedure:   Nerve Roof Injection  Date of Surgery:  Clearance TBD                                 Surgeon:  Not listed Surgeon's Group or Practice Name:  [place] Phone number:  [phone] Fax number:  [phone]   Type of Clearance Requested:   - Pharmacy:  Hold Aspirin and Clopidogrel (Plavix) for 5 Days.   Type of Anesthesia:  Local    Additional requests/questions:  Please advise surgeon/provider what medications should be held. Please fax a copy of Clearance to the surgeon's office.  Signed, Maple Mirza   03/07/2021, 4:07 PM

## 2021-03-07 NOTE — Telephone Encounter (Addendum)
° °  Patient Name: Pamela Gonzales  DOB: Oct 01, 1930 MRN: [id number]  Primary Cardiologist: Peter Martinique, MD  Chart reviewed as part of pre-operative protocol coverage. I faxed over clearance yesterday for this procedure, see other phone note - historically we have not been asked to hold ASA when she has her back injections, only Plavix, so will route to callback to clarify if she needs to hold ASA as well. She has not been previously granted to hold both so would need MD input. If only needing to hold Plavix, please see if they got our fax from yesterday. Thanks!  Charlie Pitter, PA-C 03/07/2021, 5:20 PM

## 2021-03-07 NOTE — Telephone Encounter (Signed)
Spoke with representative at [facility] and received the correct fax number. Clearance letter will be faxed.

## 2021-03-08 ENCOUNTER — Encounter: Payer: Self-pay | Admitting: Cardiology

## 2021-03-08 DIAGNOSIS — M5416 Radiculopathy, lumbar region: Secondary | ICD-10-CM | POA: Diagnosis not present

## 2021-03-08 DIAGNOSIS — R2689 Other abnormalities of gait and mobility: Secondary | ICD-10-CM | POA: Diagnosis not present

## 2021-03-08 DIAGNOSIS — G629 Polyneuropathy, unspecified: Secondary | ICD-10-CM | POA: Diagnosis not present

## 2021-03-08 NOTE — Telephone Encounter (Signed)
Dr. Martinique, please review, this patient's procedure has been cleared yesterday and she was instructed to hold the Plavix for 5 days, however surgeon's office also requested to hold her aspirin as well.  She has a history of CAD with stent placement in 2005 in mid LAD and OM 2.  Last Myoview in June 2017 was normal.  She has good functional ability doing water aerobic.  Surgeon's office request to hold aspirin for 3 days and Plavix for 5 days, would you be okay with this Dr. Martinique? please forward your response to P CV DIV PREOP

## 2021-03-08 NOTE — Telephone Encounter (Signed)
Left message for Juliann Pulse with [facility]. Per Melina Copa, PAC we will need to clarify if need to hold ASA as well. See notes from Melina Copa, [facility] that only Plavix was held in the past. If ASA does need to be held, will need to run past the MD for their input.   I will send note to their office as FYI that I left a message for a call back to clarify.

## 2021-03-08 NOTE — Telephone Encounter (Signed)
Kathy,with [facility] called back. I d/w Juliann Pulse that we are needing to clarify if ASA is needing to be held. Per Juliann Pulse ASA does need to be held x 3 days and Plavix needs to be held x 5 days. I assured Juliann Pulse that I will update our pre op provider, pharm-d, as well as we will need to address with primary cardiologist Dr. Martinique. Juliann Pulse, verbalized understanding to plan of care. We will fax clearance once all has been given the ok.

## 2021-03-11 NOTE — Telephone Encounter (Signed)
Please inform the patient that Dr. Martinique is okay with her holding both aspirin and the Plavix for 5 days prior to nerve root injection.

## 2021-03-11 NOTE — Telephone Encounter (Signed)
° ° °  Patient Name: Pamela Gonzales  DOB: [birth year] MRN: [id number]  Primary Cardiologist: Peter Martinique, MD  Chart reviewed as part of pre-operative protocol coverage. Given past medical history and time since last visit, based on ACC/AHA guidelines, Akaysha A Hauck would be at acceptable risk for the planned procedure without further cardiovascular testing.   The patient was advised that if she develops new symptoms prior to surgery to contact our office to arrange for a follow-up visit, and she verbalized understanding.  Per Dr. Martinique, patient may hold aspirin and Plavix for 5 days prior to the nerve root injection and restart as soon as possible afterward at the surgeon's discretion.  I will route this recommendation to the requesting party via Epic fax function and remove from pre-op pool.  Please call with questions.  [place], [place] 03/11/2021, 6:09 PM

## 2021-03-12 ENCOUNTER — Ambulatory Visit
Admission: RE | Admit: 2021-03-12 | Discharge: 2021-03-12 | Disposition: A | Discharge status: Home / Self Care | Payer: Medicare PPO | Source: Ambulatory Visit | Attending: Obstetrics and Gynecology | Admitting: Obstetrics and Gynecology

## 2021-03-12 ENCOUNTER — Other Ambulatory Visit: Payer: Self-pay

## 2021-03-12 DIAGNOSIS — Z1231 Encounter for screening mammogram for malignant neoplasm of breast: Secondary | ICD-10-CM

## 2021-03-12 NOTE — Telephone Encounter (Signed)
Left detailed message on voicemail, ok per dpr, advising the patient of Dr Doug Sou recommendations. Advised a call back with any questions or concerns.

## 2021-03-16 ENCOUNTER — Ambulatory Visit
Admission: RE | Admit: 2021-03-16 | Discharge: 2021-03-16 | Disposition: A | Discharge status: Home / Self Care | Payer: Medicare PPO | Source: Ambulatory Visit | Attending: Chiropractic Medicine | Admitting: Chiropractic Medicine

## 2021-03-16 ENCOUNTER — Other Ambulatory Visit: Payer: Self-pay | Admitting: Chiropractic Medicine

## 2021-03-16 DIAGNOSIS — M5416 Radiculopathy, lumbar region: Secondary | ICD-10-CM

## 2021-03-16 DIAGNOSIS — M4727 Other spondylosis with radiculopathy, lumbosacral region: Secondary | ICD-10-CM | POA: Diagnosis not present

## 2021-03-16 IMAGING — MG DIGITAL SCREENING BILAT W/ TOMO W/ CAD
8 series · 8 of 24 positions shown · non-contrast
Comparison: Previous exam(s).

CLINICAL DATA: Screening.

EXAM:
DIGITAL SCREENING BILATERAL MAMMOGRAM WITH TOMO AND CAD

[L CC synth-2D]
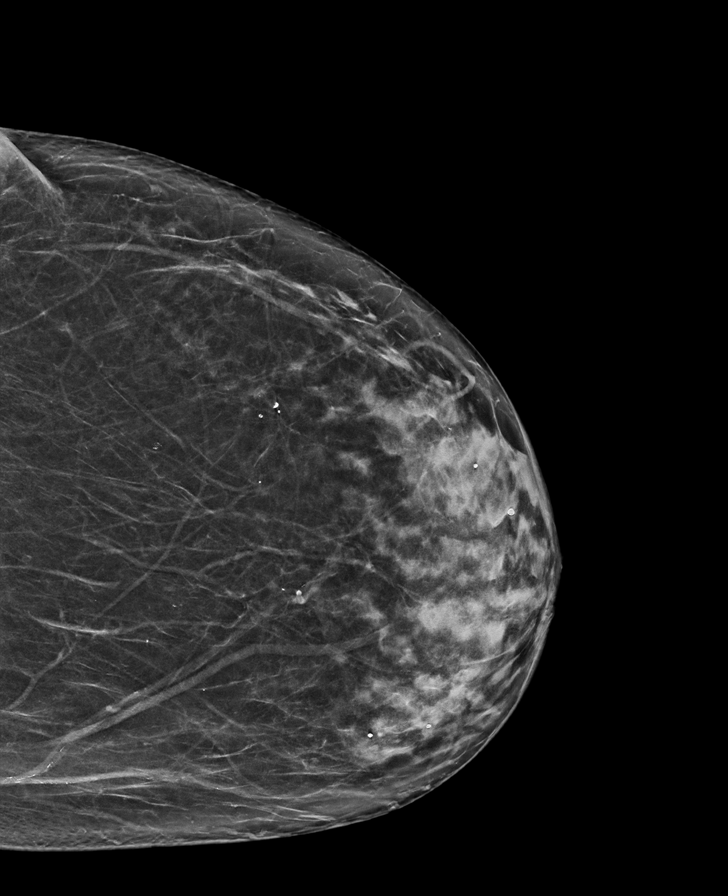

[R MLO synth-2D]
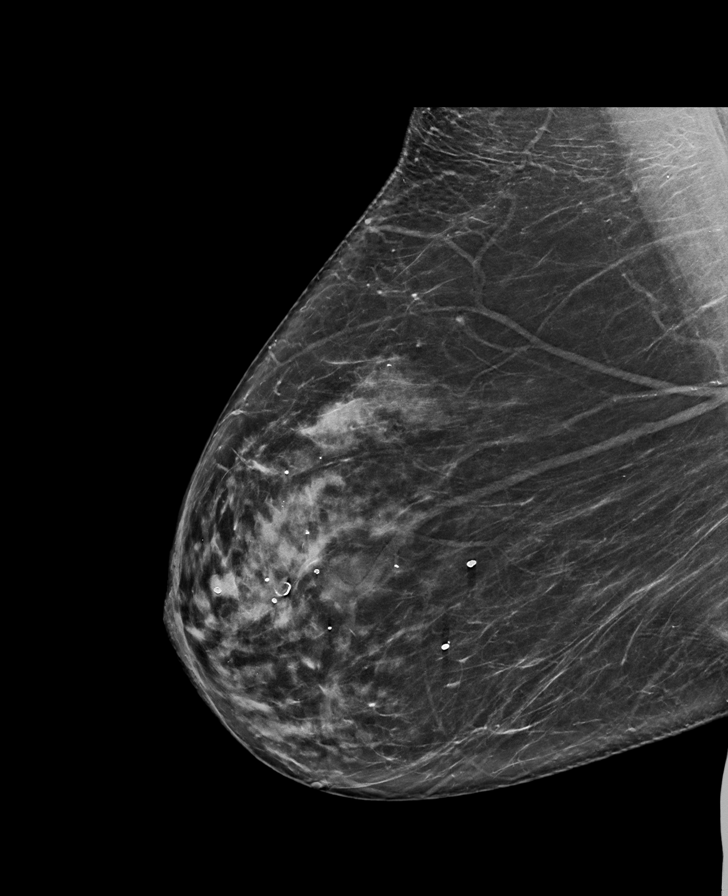

[R CC synth-2D]
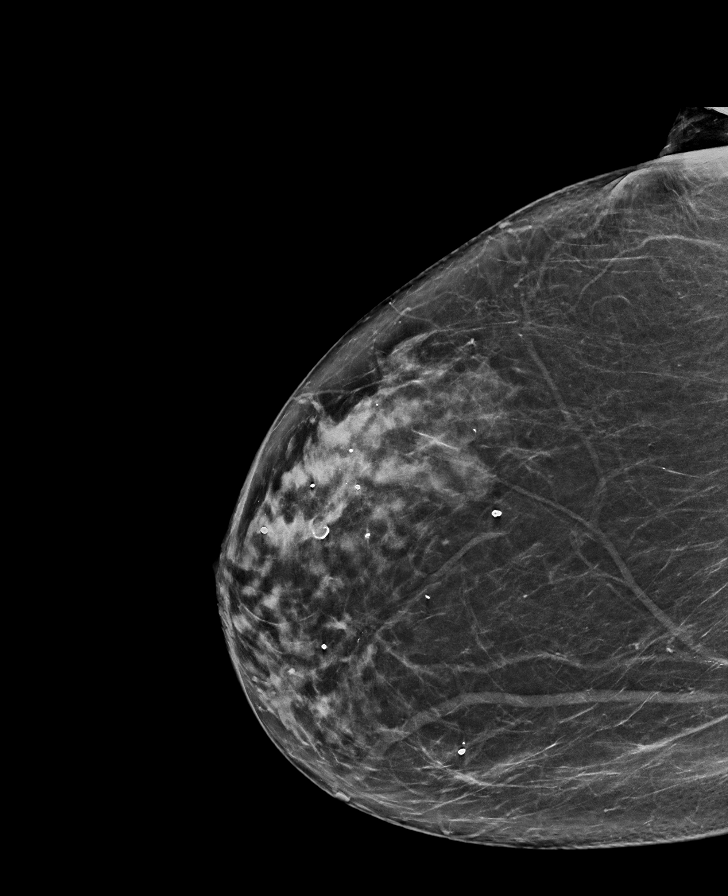

[L MLO synth-2D]
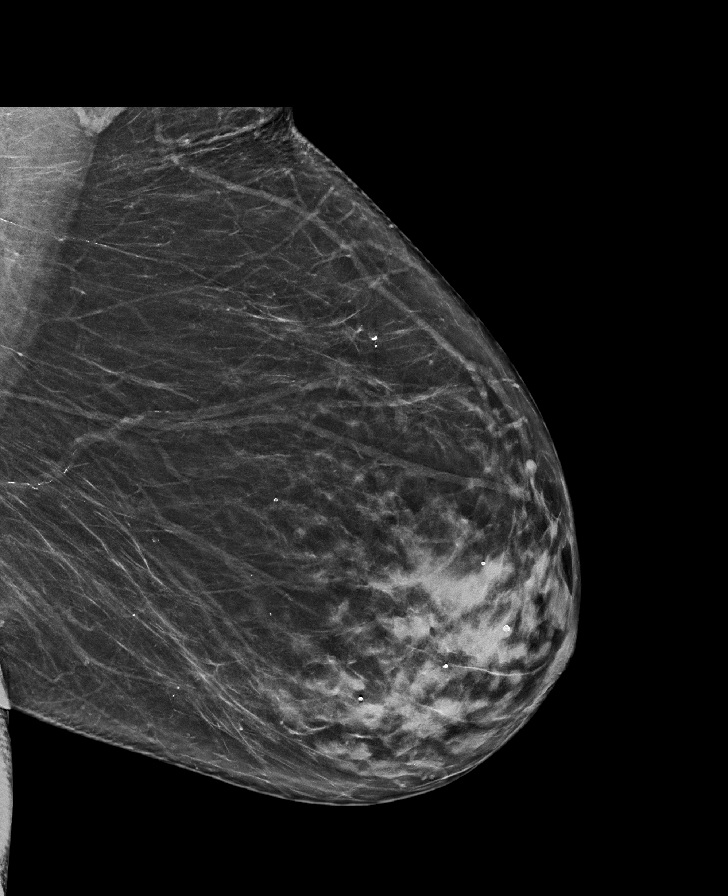

[R MLO tomo · tomo slice 41/80.0]
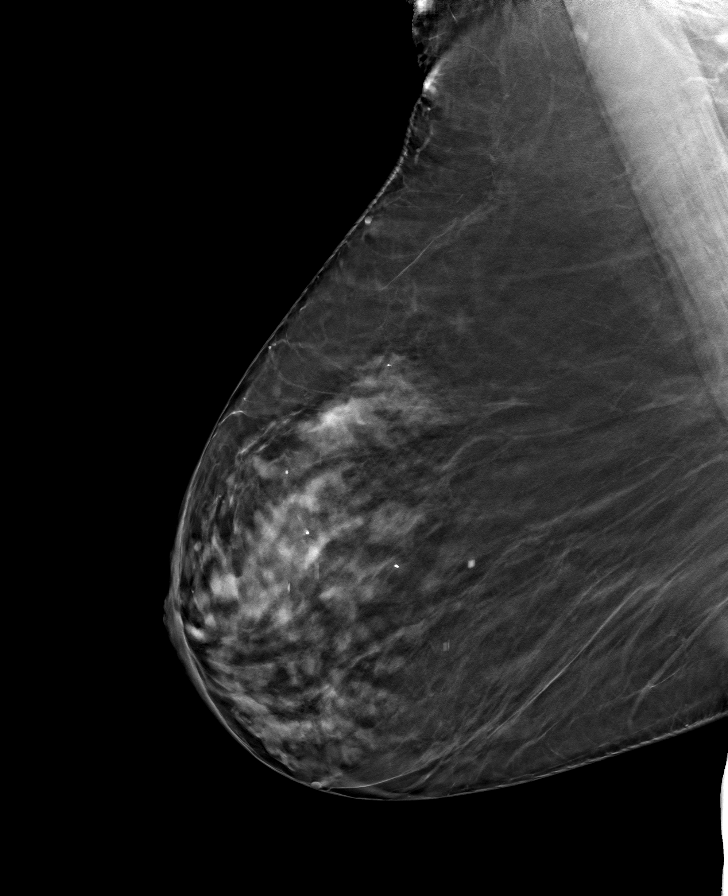

[L CC tomo · tomo slice 35/70.0]
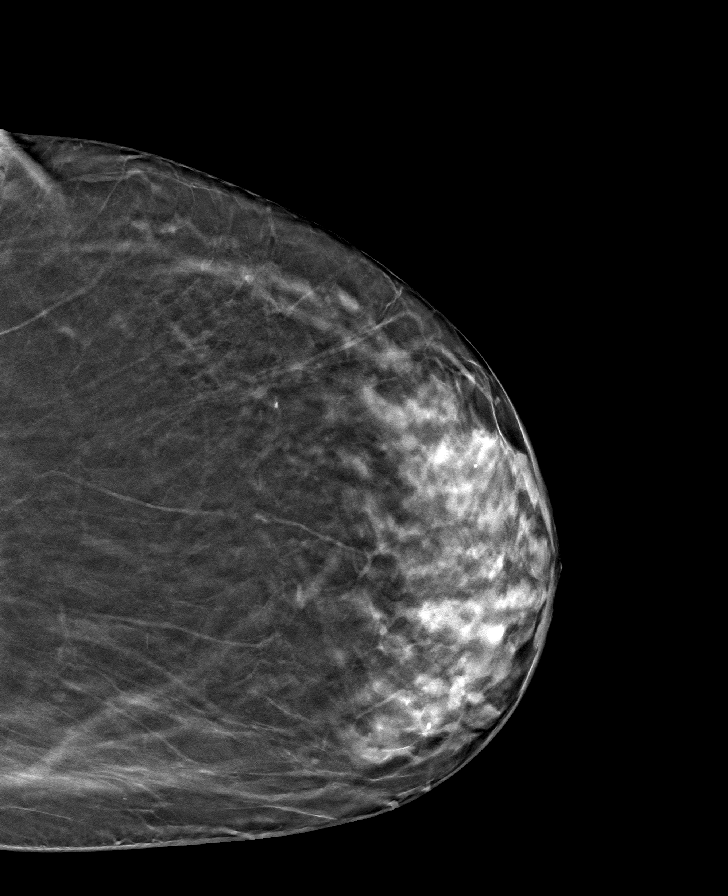

[L MLO tomo · tomo slice 39/76.0]
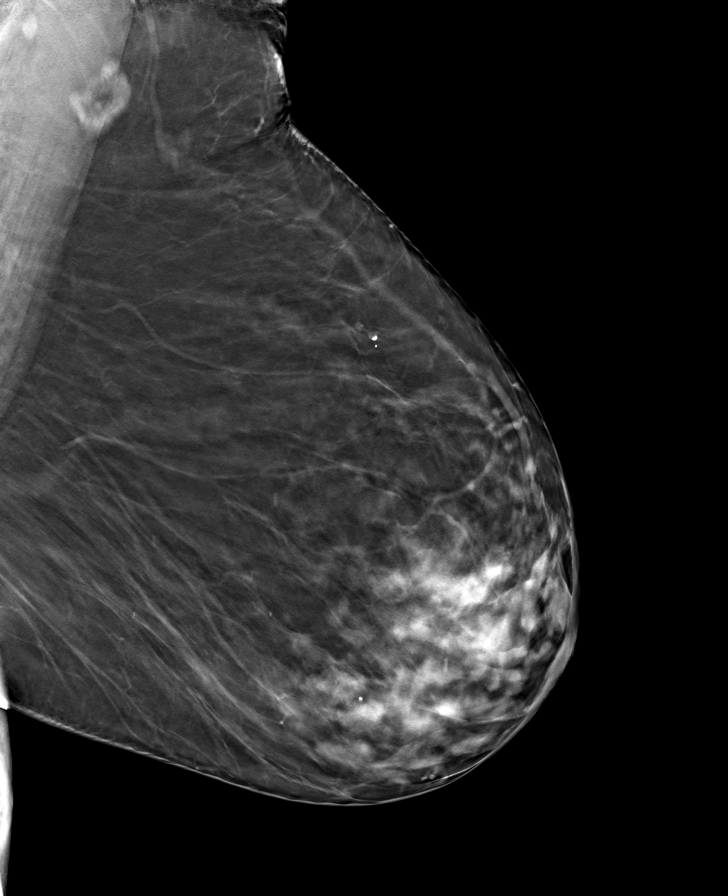

[R CC tomo · tomo slice 38/75.0]
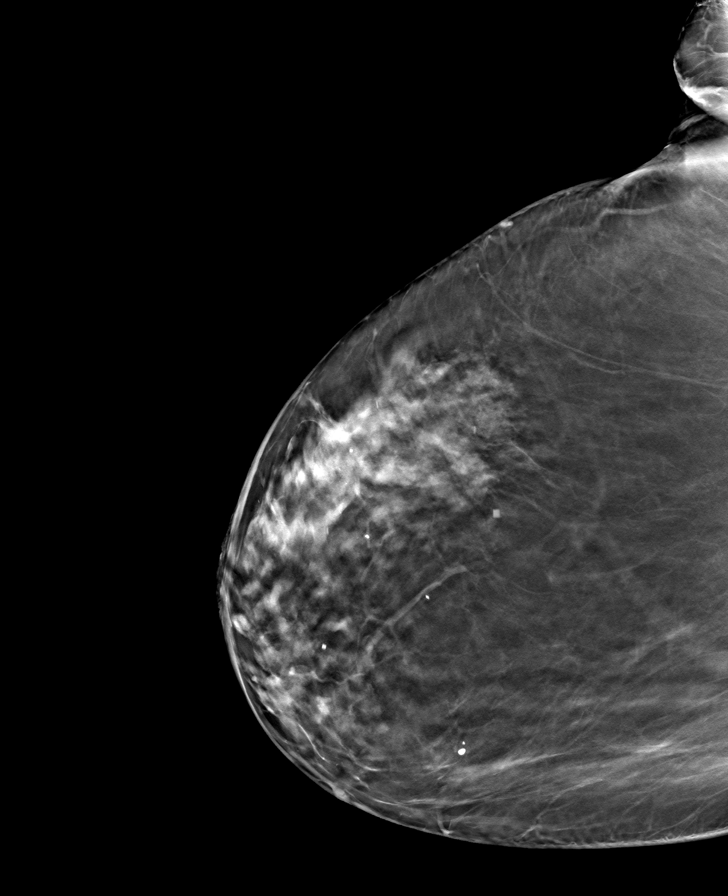

[8 of 24 positions shown; findings below may reference images not displayed]

ACR Breast Density Category c: The breast tissue is heterogeneously
dense, which may obscure small masses.
FINDINGS: There are no findings suspicious for malignancy. Images were
processed with CAD.
IMPRESSION: No mammographic evidence of malignancy. A result letter of this
screening mammogram will be mailed directly to the patient.

RECOMMENDATION:
Screening mammogram in one year. (Code:[id number])

BI-RADS CATEGORY  1: Negative.

## 2021-03-16 MED ORDER — DIAZEPAM 5 MG PO TABS
5.0000 mg | ORAL_TABLET | Freq: Once | ORAL | Status: AC
Start: 2021-03-16 — End: 2021-03-16
  Administered 2021-03-16: 5 mg via ORAL

## 2021-03-16 MED ORDER — METHYLPREDNISOLONE ACETATE 40 MG/ML INJ SUSP (RADIOLOG
80.0000 mg | Freq: Once | INTRAMUSCULAR | Status: AC
  Administered 2021-03-16: 80 mg via EPIDURAL

## 2021-03-16 MED ORDER — IOPAMIDOL (ISOVUE-M 200) INJECTION 41%
1.0000 mL | Freq: Once | INTRAMUSCULAR | Status: AC
  Administered 2021-03-16: 1 mL via EPIDURAL

## 2021-03-16 NOTE — Discharge Instructions (Signed)
Post Procedure Spinal Discharge Instruction Sheet  You may resume a regular diet and any medications that you routinely take (including pain medications) unless otherwise noted by MD.  No driving day of procedure.  Light activity throughout the rest of the day.  Do not do any strenuous work, exercise, bending or lifting.  The day following the procedure, you can resume normal physical activity but you should refrain from exercising or physical therapy for at least three days thereafter.  You may apply ice to the injection site, 20 minutes on, 20 minutes off, as needed. Do not apply ice directly to skin.    Common Side Effects:  Headaches- take your usual medications as directed by your physician.  Increase your fluid intake.  Caffeinated beverages may be helpful.  Lie flat in bed until your headache resolves.  Restlessness or inability to sleep- you may have trouble sleeping for the next few days.  Ask your referring physician if you need any medication for sleep.  Facial flushing or redness- should subside within a few days.  Increased pain- a temporary increase in pain a day or two following your procedure is not unusual.  Take your pain medication as prescribed by your referring physician.  Leg cramps  Please contact our office at [phone] for the following symptoms: Fever greater than 100 degrees. Headaches unresolved with medication after 2-3 days. Increased swelling, pain, or redness at injection site.   Thank you for visiting [facility] today.    YOU MAY RESUME YOUR PLAVIX AND ASPIRIN ANYTIME AFTER PROCEDURE TODAY.

## 2021-03-20 ENCOUNTER — Other Ambulatory Visit: Payer: Self-pay | Admitting: Pharmacy Technician

## 2021-03-22 DIAGNOSIS — M1711 Unilateral primary osteoarthritis, right knee: Secondary | ICD-10-CM | POA: Diagnosis not present

## 2021-03-29 DIAGNOSIS — M1711 Unilateral primary osteoarthritis, right knee: Secondary | ICD-10-CM | POA: Diagnosis not present

## 2021-04-02 ENCOUNTER — Other Ambulatory Visit (INDEPENDENT_AMBULATORY_CARE_PROVIDER_SITE_OTHER): Payer: Medicare PPO

## 2021-04-02 ENCOUNTER — Encounter: Payer: Self-pay | Admitting: Internal Medicine

## 2021-04-02 DIAGNOSIS — D500 Iron deficiency anemia secondary to blood loss (chronic): Secondary | ICD-10-CM | POA: Diagnosis not present

## 2021-04-02 LAB — IBC + FERRITIN
Ferritin: 37.7 ng/mL (ref 10.0–291.0)
Iron: 57 ug/dL (ref 42–145)
Saturation Ratios: 15.9 % — ABNORMAL LOW (ref 20.0–50.0)
TIBC: 358.4 ug/dL (ref 250.0–450.0)
Transferrin: 256 mg/dL (ref 212.0–360.0)

## 2021-04-02 LAB — CBC WITH DIFFERENTIAL/PLATELET
Basophils Absolute: 0.1 10*3/uL (ref 0.0–0.1)
Basophils Relative: 0.8 % (ref 0.0–3.0)
Eosinophils Absolute: 1 10*3/uL — ABNORMAL HIGH (ref 0.0–0.7)
Eosinophils Relative: 9.8 % — ABNORMAL HIGH (ref 0.0–5.0)
HCT: 41.3 % (ref 36.0–46.0)
Hemoglobin: 13.7 g/dL (ref 12.0–15.0)
Lymphocytes Relative: 14 % (ref 12.0–46.0)
Lymphs Abs: 1.5 10*3/uL (ref 0.7–4.0)
MCHC: 33.2 g/dL (ref 30.0–36.0)
MCV: 93.5 fl (ref 78.0–100.0)
Monocytes Absolute: 0.8 10*3/uL (ref 0.1–1.0)
Monocytes Relative: 8.1 % (ref 3.0–12.0)
Neutro Abs: 7.1 10*3/uL (ref 1.4–7.7)
Neutrophils Relative %: 67.3 % (ref 43.0–77.0)
Platelets: 198 10*3/uL (ref 150.0–400.0)
RBC: 4.41 Mil/uL (ref 3.87–5.11)
RDW: 16 % — ABNORMAL HIGH (ref 11.5–15.5)
WBC: 10.5 10*3/uL (ref 4.0–10.5)

## 2021-04-05 DIAGNOSIS — M1711 Unilateral primary osteoarthritis, right knee: Secondary | ICD-10-CM | POA: Diagnosis not present

## 2021-04-24 NOTE — Progress Notes (Signed)
? ?Pamela Gonzales ?Date of Birth: 06/17/1930 ?Medical Record #[id number] ? ?History of Present Illness: ?Pamela Gonzales is seen back today for follow up of CAD. She has a history of HLD, Lupus, GERD, OA, RBBB and CAD with past stents placed in 2005. She had stenting of the mid LAD and 2nd OM in 2005 with Cypher stents. Last Myoview was in June 2017 and was normal. She has remained on Plavix.  ? ?She is followed by Dr Pamela Gonzales for GERD, constipation and iron deficiency anemia felt to be partly due to [facility] lesions/hiatal hernia. Iron deficiency corrected with infusions. Last infusion in Dec.  ? ?On follow up today she is doing well from a cardiac standpoint.   She still  does water aerobics at least 3x/week. She has progressive neuropathy in her feet that limits her activity.  She uses a walker. She denies any chest pain or SOB. She brings a diary of her BP readings and it has been well controlled . She does get periodic injections in her back.  ? ?Current Outpatient Medications  ?Medication Sig Dispense Refill  ? acetaminophen (TYLENOL) 500 MG tablet Take 500 mg by mouth 3 (three) times daily. Patient takes 4 times daily.    ? amLODipine (NORVASC) 5 MG tablet TAKE 1 TABLET BY MOUTH EVERY DAY 90 tablet 3  ? aspirin 81 MG tablet Take 81 mg by mouth daily.    ? clopidogrel (PLAVIX) 75 MG tablet TAKE 1 TABLET BY MOUTH EVERY DAY 90 tablet 3  ? Coenzyme Q10 (CO Q 10 PO) Take 200 mg by mouth daily.    ? dextromethorphan-guaiFENesin (MUCINEX DM) 30-600 MG 12hr tablet Take 2 tablets by mouth daily.    ? losartan (COZAAR) 100 MG tablet Take 1 tablet (100 mg total) by mouth daily. 90 tablet 3  ? Multiple Vitamins-Minerals (MULTIVITAMIN WITH MINERALS) tablet Take 1 tablet by mouth daily.    ? MYRBETRIQ 50 MG TB24 tablet Take 50 mg by mouth daily.   2  ? NASONEX 50 MCG/ACT nasal spray 2 sprays by Nasal route Daily.    ? pantoprazole (PROTONIX) 40 MG tablet Take 1 tablet (40 mg total) by mouth daily. 90 tablet 1  ? rosuvastatin  (CRESTOR) 40 MG tablet TAKE 1 TABLET BY MOUTH EVERY DAY 90 tablet 3  ? ?No current facility-administered medications for this visit.  ? ? ?Allergies  ?Allergen Reactions  ? Cephalexin Other (See Comments)  ?  "terrible abdominal pain"  ? Tramadol Other (See Comments)  ?  Per pt "hallucinations"   ? Lisinopril Cough  ? Oxycodone Itching  ? Penicillins Other (See Comments)  ?  "Pt reports "knots on my arms"  ? Sulfa Antibiotics Other (See Comments) and Rash  ?  Skin irritation.  ? Sulfa Drugs Cross Reactors Other (See Comments)  ?  Skin irritation.  ? ? ?Past Medical History:  ?Diagnosis Date  ? Anemia   ? Arthritis   ? Asthmatic bronchitis   ? Atherosclerotic heart disease of native coronary artery without angina pectoris   ? Benign fundic gland polyps of stomach   ? CAD (coronary artery disease) 05  ? stents  ? Daytime somnolence   ? Diverticulosis   ? Esophageal dysmotility   ? Fibula fracture   ? right  ? Gallstones   ? Gastric erosions   ? GERD (gastroesophageal reflux disease)   ? Hiatal hernia   ? Hypercholesteremia   ? Hypertension   ? Internal hemorrhoids   ? Joint  stiffness   ? Lupus ([place])   ? Osteoarthritis   ? Osteoporosis   ? Paresthesia   ? Peripheral neuropathy   ? Peripheral neuropathy   ? RBBB   ? Reactive airways dysfunction syndrome (HCC)   ? Sciatica   ? Seasonal allergies   ? Spinal stenosis   ? Tubular adenoma of colon   ? ? ?Past Surgical History:  ?Procedure Laterality Date  ? ABDOMINAL HYSTERECTOMY  1975  ? BREAST EXCISIONAL BIOPSY Right   ? 1972 benign  ? CATARACT EXTRACTION, BILATERAL    ? CHOLECYSTECTOMY  07/04/2011  ? Procedure: LAPAROSCOPIC CHOLECYSTECTOMY WITH INTRAOPERATIVE CHOLANGIOGRAM;  Surgeon: Merrie Roof, MD;  Location: [place];  Service: General;  Laterality: N/A;  ? COLONOSCOPY W/ BIOPSIES    ? CORONARY ANGIOPLASTY  2005  ? ESOPHAGOGASTRODUODENOSCOPY (EGD) WITH PROPOFOL N/A 06/11/2016  ? Procedure: ESOPHAGOGASTRODUODENOSCOPY (EGD) WITH PROPOFOL;  Surgeon: Garlan Fair,  MD;  [facility];  Service: Endoscopy;  Laterality: N/A;  ? [place] SURGERY  2011  ? Right Ankle  ? FLEXIBLE SIGMOIDOSCOPY N/A 07/09/2016  ? Procedure: FLEXIBLE SIGMOIDOSCOPY;  Surgeon: Garlan Fair, MD;  Location: [facility];  Service: Endoscopy;  Laterality: N/A;  ? KNEE ARTHROSCOPY Right 03/15/2019  ? Procedure: Right knee arthroscopy; meniscal debridement chondroplasty;  Surgeon: Gaynelle Arabian, MD;  Location: [facility];  Service: Orthopedics;  Laterality: Right;  10mn  ? ? ?Social History  ? ?Tobacco Use  ?Smoking Status Never  ?Smokeless Tobacco Never  ? ? ?Social History  ? ?Substance and Sexual Activity  ?Alcohol Use Yes  ? Comment: very rare  ? ? ?Family History  ?Problem Relation Age of Onset  ? Lung cancer Father   ? Heart attack Mother   ? Heart attack Brother   ? Heart disease Brother   ? Rectal cancer Brother   ?     only 1 kidney  ? Cancer Sister   ? Pancreatic cancer Sister   ? Heart disease Sister   ? Anesthesia problems Neg Hx   ? Hypotension Neg Hx   ? Malignant hyperthermia Neg Hx   ? Pseudochol deficiency Neg Hx   ? Breast cancer Neg Hx   ? Colon cancer Neg Hx   ? Stomach cancer Neg Hx   ? ? ?Review of Systems: ?The review of systems is per the HPI.  All other systems were reviewed and are negative. ? ?Physical Exam: ?BP (!) 146/74   Pulse 80   Ht '5\' 4"'$  (1.626 m)   Wt 198 lb 12.8 oz (90.2 kg)   SpO2 96%   BMI 34.12 kg/m?  ?GENERAL:  Well appearing, obese WF in NAD ?HEENT:  PERRL, EOMI, sclera are clear. Oropharynx is clear. ?NECK:  No jugular venous distention, carotid upstroke brisk and symmetric, no bruits, no thyromegaly or adenopathy ?LUNGS:  Clear to auscultation bilaterally ?CHEST:  Unremarkable ?HEART:  RRR,  PMI not displaced or sustained,S1 and S2 within normal limits, no S3, no S4: no clicks, no rubs, no murmurs ?EXT:  2 + pulses throughout, no edema, no cyanosis no clubbing ?SKIN:  Warm and dry.  No rashes ?NEURO:  Alert and oriented x 3. Cranial nerves II  through XII intact. ?PSYCH:  Cognitively intact ? ?LABORATORY DATA: ? ?Lab Results  ?Component Value Date  ? WBC 10.5 04/02/2021  ? HGB 13.7 04/02/2021  ? HCT 41.3 04/02/2021  ? PLT 198.0 04/02/2021  ? GLUCOSE 162 (H) 06/05/2020  ? CHOL 166 07/20/2019  ?  TRIG 118 07/20/2019  ? HDL 62 07/20/2019  ? [place] 83 07/20/2019  ? ALT 15 07/20/2019  ? AST 24 07/20/2019  ? NA 140 06/05/2020  ? K 3.8 06/05/2020  ? CL 106 06/05/2020  ? CREATININE 0.78 06/05/2020  ? BUN 18 06/05/2020  ? CO2 27 06/05/2020  ? INR 1.07 08/03/2009  ? HGBA1C 5.4 05/10/2010  ? ?Dated 08/17/20: cholesterol 155, triglycerides 123, HDL 55, LDL 78. CMET and CBC normal ? ? ?Ecg today shows NSR with first degree AV block and RBBB. Rate 80. No change from prior.  I have personally reviewed and interpreted this study. ? ?Myoview study 07/26/15:Study Highlights  ? ?The left ventricular ejection fraction is hyperdynamic (>65%). ?Nuclear stress EF: 72%. ?There was no ST segment deviation noted during stress. ?The study is normal. ?This is a low risk study. ?  ?This is a normal pharmacologic nuclear study with no evidence of prior infarct or ischemia. ?Normal LVEF.  ?   ? ? ? ?Assessment / Plan: ?1. CAD - s/p stenting of the LAD and OM in 2005 with first generation Cypher stents. Normal Myoview in June 2017.  She is asymptomatic. Since she has first generation DES we have continued  DAPT indefinitely. May hold Plavix for spine injections. ? ?2. HTN - BP is well controlled.  ? ?3. HLD - on statin. LDL 78. Goal 70. On maximal dose of Crestor. Per prior discussion she would like to avoid additional medical therapy.  ? ?4. Chronic RBBB since 2014.   ? ?5. Peripheral neuropathy ? ?6. Iron deficiency anemia. Improved. Periodic infusions ? ?I will follow up in 6 months  ? ? ? ?

## 2021-04-30 ENCOUNTER — Other Ambulatory Visit: Payer: Self-pay | Admitting: Family Medicine

## 2021-04-30 ENCOUNTER — Encounter: Payer: Self-pay | Admitting: Cardiology

## 2021-04-30 ENCOUNTER — Ambulatory Visit (INDEPENDENT_AMBULATORY_CARE_PROVIDER_SITE_OTHER): Payer: Medicare PPO | Admitting: Cardiology

## 2021-04-30 VITALS — BP 146/74 | HR 80 | Ht 64.0 in | Wt 198.8 lb

## 2021-04-30 DIAGNOSIS — I2510 Atherosclerotic heart disease of native coronary artery without angina pectoris: Secondary | ICD-10-CM

## 2021-04-30 DIAGNOSIS — I4510 Unspecified right bundle-branch block: Secondary | ICD-10-CM

## 2021-04-30 DIAGNOSIS — E7800 Pure hypercholesterolemia, unspecified: Secondary | ICD-10-CM | POA: Diagnosis not present

## 2021-04-30 DIAGNOSIS — I10 Essential (primary) hypertension: Secondary | ICD-10-CM | POA: Diagnosis not present

## 2021-04-30 DIAGNOSIS — M5416 Radiculopathy, lumbar region: Secondary | ICD-10-CM

## 2021-04-30 NOTE — Patient Instructions (Signed)
d 

## 2021-05-01 ENCOUNTER — Encounter: Payer: Self-pay | Admitting: Cardiology

## 2021-05-09 ENCOUNTER — Ambulatory Visit
Admission: RE | Admit: 2021-05-09 | Discharge: 2021-05-09 | Disposition: A | Discharge status: Home / Self Care | Payer: Self-pay | Source: Ambulatory Visit | Attending: Family Medicine | Admitting: Family Medicine

## 2021-05-09 ENCOUNTER — Other Ambulatory Visit: Payer: Self-pay | Admitting: Family Medicine

## 2021-05-09 DIAGNOSIS — M5416 Radiculopathy, lumbar region: Secondary | ICD-10-CM

## 2021-05-10 ENCOUNTER — Ambulatory Visit
Admission: RE | Admit: 2021-05-10 | Discharge: 2021-05-10 | Disposition: A | Discharge status: Home / Self Care | Payer: Medicare PPO | Source: Ambulatory Visit | Attending: Family Medicine | Admitting: Family Medicine

## 2021-05-10 ENCOUNTER — Other Ambulatory Visit: Payer: Self-pay | Admitting: Family Medicine

## 2021-05-10 DIAGNOSIS — M5416 Radiculopathy, lumbar region: Secondary | ICD-10-CM

## 2021-05-10 DIAGNOSIS — M4727 Other spondylosis with radiculopathy, lumbosacral region: Secondary | ICD-10-CM | POA: Diagnosis not present

## 2021-05-10 MED ORDER — DIAZEPAM 5 MG PO TABS
5.0000 mg | ORAL_TABLET | Freq: Once | ORAL | Status: AC
  Administered 2021-05-10: 5 mg via ORAL

## 2021-05-10 MED ORDER — METHYLPREDNISOLONE ACETATE 40 MG/ML INJ SUSP (RADIOLOG
80.0000 mg | Freq: Once | INTRAMUSCULAR | Status: AC
  Administered 2021-05-10: 80 mg via EPIDURAL

## 2021-05-10 MED ORDER — IOPAMIDOL (ISOVUE-M 200) INJECTION 41%
1.0000 mL | Freq: Once | INTRAMUSCULAR | Status: AC
  Administered 2021-05-10: 1 mL via EPIDURAL

## 2021-05-10 NOTE — Discharge Instructions (Signed)

## 2021-05-14 ENCOUNTER — Other Ambulatory Visit: Payer: Self-pay | Admitting: Internal Medicine

## 2021-05-17 ENCOUNTER — Ambulatory Visit: Payer: Medicare PPO | Admitting: Neurology

## 2021-05-21 ENCOUNTER — Ambulatory Visit: Payer: Medicare PPO | Admitting: Neurology

## 2021-05-21 ENCOUNTER — Encounter: Payer: Self-pay | Admitting: Neurology

## 2021-05-21 VITALS — BP 148/73 | HR 79 | Ht 64.0 in | Wt 195.6 lb

## 2021-05-21 DIAGNOSIS — M48062 Spinal stenosis, lumbar region with neurogenic claudication: Secondary | ICD-10-CM | POA: Diagnosis not present

## 2021-05-21 DIAGNOSIS — R202 Paresthesia of skin: Secondary | ICD-10-CM | POA: Diagnosis not present

## 2021-05-21 DIAGNOSIS — M5417 Radiculopathy, lumbosacral region: Secondary | ICD-10-CM

## 2021-05-21 DIAGNOSIS — R200 Anesthesia of skin: Secondary | ICD-10-CM

## 2021-05-21 DIAGNOSIS — G6289 Other specified polyneuropathies: Secondary | ICD-10-CM | POA: Diagnosis not present

## 2021-05-21 NOTE — Progress Notes (Signed)
?[facility] ? ? ? ?Provider:  Dr Jaynee Eagles ?Requesting Provider: Lawerance Cruel, MD ?Primary Care Provider:  Lawerance Cruel, MD ? ?CC:  lumbar stenosis and radiculopathy follow up ? ?05/21/2021: She went to [facility] and had back injections. She also sees Dr. Harrington Challenger as his pcp. She has numbness and tingling in her feet. I evaluated her in 10/15/2019 and discussed neurology really didn't have anymore to offer her. She gets injections regularly and loves Dr. Nelia Shi in particular. Occasionally she has a little tingle in the foot, not every day and not in the entire foot, in the left foot. She has leg aches. She remembers as a child with leg aches. Her legs ache. Not every day. [place] lives [place], she uses all the machines. She uses 2 upper body machines, she does some matt work, knee bends and lifts her legs straight up one at a time, she does that every day. She is also doing hamstring exercises and she has been to physical theray many times (I suggested she follow PT directions on exercises to minimize risk to lower back). She has Charlie her walker, her gait is stable, no falls, no worsening of walking, she uses her walker all the time, she was a patient of dr Sherwood Gambler who refused to do surgery, no significant pain in her low back, tylenol helps, no shooting pain into her legs she has sciatica in the left leg which is stable, she does stetching that helps the sciatica. No other focal neurologic deficits, associated symptoms, inciting events or modifiable factors. ? ?Patient complains of symptoms per HPI as well as the following symptoms: none . Pertinent negatives and positives per HPI. All others negative ? ? ?HPI 10/15/2019:  ALEXIA DINGER is a 88 y.o. female here as requested by Lawerance Cruel, MD for " please evaluate/provide second opinion for care/treatment.  Also?  Sciatica of lumbar radiculopathy."She has a past medical history of coronary artery disease, hypertension, joint stiffness,  arthritis, constipation, sinusitis, reactive airway dysfunction syndrome, sciatica, functional constipation, wheezing, peripheral neuropathy, balance problems, daytime somnolence.  I reviewed Dr. Alan Ripper notes: patient seen [place] [facility]: Patient is an 88 year old right-handed white female who she seen on and off since January 2012 (this is a note from Dr. Sherwood Gambler at [facility]).  She continues to have difficulties with her low back, lower extremities, she continues to have occasional falls, she is followed by Dr. Melinda Crutch is her primary physician and Dr. Peter Martinique as her cardiologist.  She reports falls and increased low back discomfort, multiple falls for in a year, she does find that her left lower extremity can give way in standing, she occasionally loses control of her left foot, she describes increased numbness and tingling in the left foot and ankle, particularly over the past couple months, overall she feels her symptoms are worsening.  She uses extra Tylenol as needed.  She exercises regularly doing stretching and flexibility exercises, weight resistance in the gym and water aerobics, she has osteoporosis and does water aerobics.  Examination showed normal rate rhythm, no murmur, no tenderness to palpation over the lumbar spinous processes or paralumbar muscular, she can flex to 90 degrees but limited to extension, straight leg raise was negative bilaterally, neurologically she is 5 out of 5 strength, sensation intact to pinprick in the distal extremities, some decreased vibratory sense in the distal lower extremities, reflexes are 2 at the quads, absent in the gastroc, toes are downgoing, gait  shows a slightly flexed posture.  She has stenosis particularly at the at the L4-L5 level, which may be worse with the increasing spondylolisthesis seen on MRI.  Recent lumbar x-rays were repeated and compared to her x-rays of May 2017 and the MRI was done in January 2012:  She has significant facet arthropathy in the lower spine area, and a grade 2 spondylolisthesis on L4-L5 and a grade 2 spondylolisthesis at L5-S1.  In comparison to the x-rays done 20 months ago, the L5-S1 spondylolisthesis is unchanged but the L4-L5 spondylolisthesis has increased.  They are relatively static through flexion and extension there is disc space narrowing at the L5-S1 level.  ? ?Impression from Dr. Sherwood Gambler, note review:  so the patient has some falls, she says her lower extremities can give way, on exam she has intact strength, may be some mild distal peripheral neuropathy, it is unclear with contributing to this patient's falls, possible peripheral neuropathy not being fully aware of where her feet are rather than weakness, which we do not find an examination.  They asked for another MRI as the last one was done 7 years ago however he did not think surgical intervention was indicated despite the progressive degeneration the surgery would be quite extensive and certainly she would need to be off Plavix for at least 3 weeks perioperatively.  They did repeat the MRI. ? ?Of note patient was seeing Dr. Posey Pronto who is a neuromuscular specialist and neurology and was last seen about a year ago. I also reviewed these notes: Vitamin B12 and TSH levels were checked which were normal.  MRI of the lumbar spine showed severe lumbar canal stenosis at L4-L5 and severe by foraminal stenosis at L5-S1.  She had an EMG nerve conduction study which confirms the presence of a sensorimotor polyneuropathy as well as bilateral S1 radiculopathy.  No signs of progression. ? ?Patient is here alone ans she reports she has not had many falls. She is here because of her neuropathy.  She did not feel going to other doctors in the past was very helpful, however now she is at [facility] and feels as though this is helpful.  She reports she was diagnosed with peripheral neuropathy by Dr. Posey Pronto, she also understand she has significant  low back issues, her business concern is left foot numbness, continuous, she has not had many falls. She has peripheral neuropathy and wants to know what that means and what she can do about it. She feels like she is getting the right treatment for her back. She is really happy with Dr. Nelva Bush' team.  However her symptoms have progressed over the years and she is very concerned, she thinks she needs another opinion on her peripheral neuropathy. She has not had a fall in 2 years. Diagnosed with peripheral neuropathy by Dr. Posey Pronto as well as other physicians.  She went to see Dr. Posey Pronto about her neck. The left foot is the primary problem started getting "thick" in 2018.  Progressively worsened.  At that time she did have a few falls. Primarily the left foot, the entire foot, The whole foot. When she stands on solid floor, or hard woods, her left foot becomes immobile and goes up into the leg to her knee however she cannot say and what pattern,. She feels like she lost use of it. Right foot is not an issue. Worse with standing. Also worse with walking on pavement. A shot helped with the sciatic pain but now with the numbness in the  foot.  She denies any other weakness.  No significant problems with the other foot.  We had extensive extended visit today please see assessment and plan. ? ?Reviewed notes, labs and imaging from outside physicians, which showed: ? ?08/2017: b1 normal, no monoclonal protein detected ? ? ? reviewed images and agree also reviewed images with patient in the office together ? ? ?MRI Lumbar Spine 2012: L1-2: Mild disc bulge without central canal or foraminal stenosis. ?  ?L2-3: Facet arthropathy is noted. No disc bulge or protrusion. ?Central canal and foramina are open. ?  ?L3-4: Mild disc bulge with some ligamentum flavum thickening and ?facet arthropathy. Central canal is mildly narrowed. Foramina are ?also mildly narrowed. ?  ?L4-5: The patient has new small bilateral facet joint  effusions. ?Ligamentum flavum is thickened and there is a broad-based disc ?bulge causing moderate to moderately severe central canal stenosis ?with narrowing of both lateral recesses. Moderate bilateral ?foraminal narro

## 2021-06-19 ENCOUNTER — Encounter: Payer: Self-pay | Admitting: Cardiology

## 2021-06-21 ENCOUNTER — Other Ambulatory Visit: Payer: Self-pay | Admitting: Cardiology

## 2021-06-21 NOTE — Telephone Encounter (Signed)
It has been 18 years since her stent. I think it is safe to discontinue Plavix at this time. Continue ASA  Pamela Brathwaite Martinique MD, [facility]

## 2021-07-02 ENCOUNTER — Other Ambulatory Visit: Payer: Self-pay | Admitting: Family Medicine

## 2021-07-02 DIAGNOSIS — M5416 Radiculopathy, lumbar region: Secondary | ICD-10-CM

## 2021-07-15 ENCOUNTER — Encounter: Payer: Self-pay | Admitting: Cardiology

## 2021-07-18 NOTE — Telephone Encounter (Signed)
Since there has been no other change I suspect this is just effects of gravity. May sure to restrict salt intake. If swelling worsens let us know. May have to put on a diuretic  Glennette Galster Martinique MD, [facility]

## 2021-07-27 ENCOUNTER — Ambulatory Visit
Admission: RE | Admit: 2021-07-27 | Discharge: 2021-07-27 | Disposition: A | Discharge status: Home / Self Care | Payer: Medicare PPO | Source: Ambulatory Visit | Attending: Family Medicine | Admitting: Family Medicine

## 2021-07-27 ENCOUNTER — Other Ambulatory Visit: Payer: Self-pay | Admitting: Family Medicine

## 2021-07-27 DIAGNOSIS — M4727 Other spondylosis with radiculopathy, lumbosacral region: Secondary | ICD-10-CM | POA: Diagnosis not present

## 2021-07-27 DIAGNOSIS — M5416 Radiculopathy, lumbar region: Secondary | ICD-10-CM

## 2021-07-27 MED ORDER — METHYLPREDNISOLONE ACETATE 40 MG/ML INJ SUSP (RADIOLOG
80.0000 mg | Freq: Once | INTRAMUSCULAR | Status: AC
  Administered 2021-07-27: 80 mg via EPIDURAL

## 2021-07-27 MED ORDER — IOPAMIDOL (ISOVUE-M 200) INJECTION 41%
1.0000 mL | Freq: Once | INTRAMUSCULAR | Status: AC
  Administered 2021-07-27: 1 mL via EPIDURAL

## 2021-07-27 NOTE — Discharge Instructions (Signed)

## 2021-08-21 DIAGNOSIS — R32 Unspecified urinary incontinence: Secondary | ICD-10-CM | POA: Diagnosis not present

## 2021-08-21 DIAGNOSIS — K5900 Constipation, unspecified: Secondary | ICD-10-CM | POA: Diagnosis not present

## 2021-08-21 DIAGNOSIS — M1990 Unspecified osteoarthritis, unspecified site: Secondary | ICD-10-CM | POA: Diagnosis not present

## 2021-08-21 DIAGNOSIS — I10 Essential (primary) hypertension: Secondary | ICD-10-CM | POA: Diagnosis not present

## 2021-08-21 DIAGNOSIS — Z6832 Body mass index (BMI) 32.0-32.9, adult: Secondary | ICD-10-CM | POA: Diagnosis not present

## 2021-08-21 DIAGNOSIS — E669 Obesity, unspecified: Secondary | ICD-10-CM | POA: Diagnosis not present

## 2021-08-21 DIAGNOSIS — I2510 Atherosclerotic heart disease of native coronary artery without angina pectoris: Secondary | ICD-10-CM | POA: Diagnosis not present

## 2021-08-21 DIAGNOSIS — K219 Gastro-esophageal reflux disease without esophagitis: Secondary | ICD-10-CM | POA: Diagnosis not present

## 2021-08-21 DIAGNOSIS — E785 Hyperlipidemia, unspecified: Secondary | ICD-10-CM | POA: Diagnosis not present

## 2021-08-27 DIAGNOSIS — L300 Nummular dermatitis: Secondary | ICD-10-CM | POA: Diagnosis not present

## 2021-08-27 DIAGNOSIS — Z85828 Personal history of other malignant neoplasm of skin: Secondary | ICD-10-CM | POA: Diagnosis not present

## 2021-08-27 DIAGNOSIS — L309 Dermatitis, unspecified: Secondary | ICD-10-CM | POA: Diagnosis not present

## 2021-08-27 DIAGNOSIS — D692 Other nonthrombocytopenic purpura: Secondary | ICD-10-CM | POA: Diagnosis not present

## 2021-09-11 ENCOUNTER — Other Ambulatory Visit: Payer: Self-pay | Admitting: Family Medicine

## 2021-09-11 DIAGNOSIS — M5416 Radiculopathy, lumbar region: Secondary | ICD-10-CM

## 2021-09-15 ENCOUNTER — Other Ambulatory Visit: Payer: Self-pay | Admitting: Cardiology

## 2021-09-21 ENCOUNTER — Ambulatory Visit
Admission: RE | Admit: 2021-09-21 | Discharge: 2021-09-21 | Disposition: A | Discharge status: Home / Self Care | Payer: Medicare PPO | Source: Ambulatory Visit | Attending: Family Medicine | Admitting: Family Medicine

## 2021-09-21 ENCOUNTER — Other Ambulatory Visit: Payer: Self-pay | Admitting: Family Medicine

## 2021-09-21 DIAGNOSIS — M4727 Other spondylosis with radiculopathy, lumbosacral region: Secondary | ICD-10-CM | POA: Diagnosis not present

## 2021-09-21 DIAGNOSIS — M5416 Radiculopathy, lumbar region: Secondary | ICD-10-CM

## 2021-09-21 MED ORDER — IOPAMIDOL (ISOVUE-M 200) INJECTION 41%
1.0000 mL | Freq: Once | INTRAMUSCULAR | Status: AC
  Administered 2021-09-21: 1 mL via EPIDURAL

## 2021-09-21 MED ORDER — METHYLPREDNISOLONE ACETATE 40 MG/ML INJ SUSP (RADIOLOG
80.0000 mg | Freq: Once | INTRAMUSCULAR | Status: AC
Start: 2021-09-21 — End: 2021-09-21
  Administered 2021-09-21: 80 mg via EPIDURAL

## 2021-09-21 NOTE — Discharge Instructions (Signed)

## 2021-09-27 DIAGNOSIS — I2510 Atherosclerotic heart disease of native coronary artery without angina pectoris: Secondary | ICD-10-CM | POA: Diagnosis not present

## 2021-09-27 DIAGNOSIS — I10 Essential (primary) hypertension: Secondary | ICD-10-CM | POA: Diagnosis not present

## 2021-09-27 DIAGNOSIS — M810 Age-related osteoporosis without current pathological fracture: Secondary | ICD-10-CM | POA: Diagnosis not present

## 2021-10-04 DIAGNOSIS — M5430 Sciatica, unspecified side: Secondary | ICD-10-CM | POA: Diagnosis not present

## 2021-10-04 DIAGNOSIS — Z6833 Body mass index (BMI) 33.0-33.9, adult: Secondary | ICD-10-CM | POA: Diagnosis not present

## 2021-10-04 DIAGNOSIS — K219 Gastro-esophageal reflux disease without esophagitis: Secondary | ICD-10-CM | POA: Diagnosis not present

## 2021-10-04 DIAGNOSIS — I10 Essential (primary) hypertension: Secondary | ICD-10-CM | POA: Diagnosis not present

## 2021-10-04 DIAGNOSIS — Z0000 Encounter for general adult medical examination without abnormal findings: Secondary | ICD-10-CM | POA: Diagnosis not present

## 2021-10-10 DIAGNOSIS — M1711 Unilateral primary osteoarthritis, right knee: Secondary | ICD-10-CM | POA: Diagnosis not present

## 2021-10-16 DIAGNOSIS — M1711 Unilateral primary osteoarthritis, right knee: Secondary | ICD-10-CM | POA: Diagnosis not present

## 2021-10-24 DIAGNOSIS — M1711 Unilateral primary osteoarthritis, right knee: Secondary | ICD-10-CM | POA: Diagnosis not present

## 2021-10-28 NOTE — Progress Notes (Unsigned)
Pamela Gonzales Date of Birth: 07-Sep-1930 Medical Record #[id number]  History of Present Illness: Pamela Gonzales is seen back today for follow up of CAD. She has a history of HLD, Lupus, GERD, OA, RBBB and CAD with past stents placed in 2005. She had stenting of the mid LAD and 2nd OM in 2005 with Cypher stents. Last Myoview was in June 2017 and was normal. She has remained on Plavix.   She is followed by Dr Hilarie Fredrickson for GERD, constipation and iron deficiency anemia felt to be partly due to [facility] lesions/hiatal hernia. Iron deficiency corrected with infusions. Last infusion in Dec.   On follow up today she is doing well from a cardiac standpoint.   She still  does water aerobics at least 3x/week. She has progressive neuropathy in her feet that limits her activity.  She uses a walker. She denies any chest pain or SOB. She brings a diary of her BP readings and it has been well controlled . She does get periodic injections in her back.   Current Outpatient Medications  Medication Sig Dispense Refill   acetaminophen (TYLENOL) 500 MG tablet Take 500 mg by mouth 3 (three) times daily. Patient takes 4 times daily.     amLODipine (NORVASC) 5 MG tablet TAKE 1 TABLET BY MOUTH EVERY DAY 90 tablet 3   aspirin 81 MG tablet Take 81 mg by mouth daily.     Coenzyme Q10 (CO Q 10 PO) Take 200 mg by mouth daily.     dextromethorphan-guaiFENesin (MUCINEX DM) 30-600 MG 12hr tablet Take 2 tablets by mouth daily.     losartan (COZAAR) 100 MG tablet TAKE 1 TABLET BY MOUTH EVERY DAY 90 tablet 3   Multiple Vitamins-Minerals (MULTIVITAMIN WITH MINERALS) tablet Take 1 tablet by mouth daily.     MYRBETRIQ 50 MG TB24 tablet Take 50 mg by mouth daily.   2   NASONEX 50 MCG/ACT nasal spray 2 sprays by Nasal route Daily.     pantoprazole (PROTONIX) 40 MG tablet TAKE 1 TABLET BY MOUTH EVERY DAY 90 tablet 1   rosuvastatin (CRESTOR) 40 MG tablet TAKE 1 TABLET BY MOUTH EVERY DAY 90 tablet 3   No current facility-administered  medications for this visit.    Allergies  Allergen Reactions   Cephalexin Other (See Comments)    "terrible abdominal pain"   Tramadol Other (See Comments)    Per pt "hallucinations"    Lisinopril Cough   Oxycodone Itching   Penicillins Other (See Comments)    "Pt reports "knots on my arms"   Sulfa Antibiotics Other (See Comments) and Rash    Skin irritation.   Sulfa Drugs Cross Reactors Other (See Comments)    Skin irritation.    Past Medical History:  Diagnosis Date   Anemia    Arthritis    Asthmatic bronchitis    Atherosclerotic heart disease of native coronary artery without angina pectoris    Benign fundic gland polyps of stomach    CAD (coronary artery disease) 05   stents   Daytime somnolence    Diverticulosis    Esophageal dysmotility    Fibula fracture    right   Gallstones    Gastric erosions    GERD (gastroesophageal reflux disease)    Hiatal hernia    Hypercholesteremia    Hypertension    Internal hemorrhoids    Joint stiffness    Lupus (HCC)    Osteoarthritis    Osteoporosis    Paresthesia  Peripheral neuropathy    Peripheral neuropathy    RBBB    Reactive airways dysfunction syndrome ([place])    Sciatica    Seasonal allergies    Spinal stenosis    Tubular adenoma of colon     Past Surgical History:  Procedure Laterality Date   ABDOMINAL HYSTERECTOMY  1975   BREAST EXCISIONAL BIOPSY Right    1972 benign   CATARACT EXTRACTION, BILATERAL     CHOLECYSTECTOMY  07/04/2011   Procedure: LAPAROSCOPIC CHOLECYSTECTOMY WITH INTRAOPERATIVE CHOLANGIOGRAM;  Surgeon: Merrie Roof, MD;  Location: [place];  Service: General;  Laterality: N/A;   COLONOSCOPY W/ BIOPSIES     CORONARY ANGIOPLASTY  2005   ESOPHAGOGASTRODUODENOSCOPY (EGD) WITH PROPOFOL N/A 06/11/2016   Procedure: ESOPHAGOGASTRODUODENOSCOPY (EGD) WITH PROPOFOL;  Surgeon: Garlan Fair, MD;  [facility]: [facility];  Service: Endoscopy;  Laterality: N/A;   FIBULA FRACTURE SURGERY  2011    Right Ankle   FLEXIBLE SIGMOIDOSCOPY N/A 07/09/2016   Procedure: FLEXIBLE SIGMOIDOSCOPY;  Surgeon: Garlan Fair, MD;  Location: [facility];  Service: Endoscopy;  Laterality: N/A;   KNEE ARTHROSCOPY Right 03/15/2019   Procedure: Right knee arthroscopy; meniscal debridement chondroplasty;  Surgeon: Gaynelle Arabian, MD;  Location: [facility];  Service: Orthopedics;  Laterality: Right;  15mn    Social History   Tobacco Use  Smoking Status Never  Smokeless Tobacco Never    Social History   Substance and Sexual Activity  Alcohol Use Yes   Comment: very rare    Family History  Problem Relation Age of Onset   Lung cancer Father    Heart attack Mother    Heart attack Brother    Heart disease Brother    Rectal cancer Brother        only 1 kidney   Cancer Sister    Pancreatic cancer Sister    Heart disease Sister    Anesthesia problems Neg Hx    Hypotension Neg Hx    Malignant hyperthermia Neg Hx    Pseudochol deficiency Neg Hx    Breast cancer Neg Hx    Colon cancer Neg Hx    Stomach cancer Neg Hx     Review of Systems: The review of systems is per the HPI.  All other systems were reviewed and are negative.  Physical Exam: There were no vitals taken for this visit. GENERAL:  Well appearing, obese WF in NAD HEENT:  PERRL, EOMI, sclera are clear. Oropharynx is clear. NECK:  No jugular venous distention, carotid upstroke brisk and symmetric, no bruits, no thyromegaly or adenopathy LUNGS:  Clear to auscultation bilaterally CHEST:  Unremarkable HEART:  RRR,  PMI not displaced or sustained,S1 and S2 within normal limits, no S3, no S4: no clicks, no rubs, no murmurs EXT:  2 + pulses throughout, no edema, no cyanosis no clubbing SKIN:  Warm and dry.  No rashes NEURO:  Alert and oriented x 3. Cranial nerves II through XII intact. PSYCH:  Cognitively intact  LABORATORY DATA:  Lab Results  Component Value Date   WBC 10.5 04/02/2021   HGB 13.7 04/02/2021   HCT 41.3  04/02/2021   PLT 198.0 04/02/2021   GLUCOSE 162 (H) 06/05/2020   CHOL 166 07/20/2019   TRIG 118 07/20/2019   HDL 62 07/20/2019   LDLCALC 83 07/20/2019   ALT 15 07/20/2019   AST 24 07/20/2019   NA 140 06/05/2020   K 3.8 06/05/2020   CL 106 06/05/2020   CREATININE 0.78 06/05/2020  BUN 18 06/05/2020   CO2 27 06/05/2020   INR 1.07 08/03/2009   HGBA1C 5.4 05/10/2010   Dated 08/17/20: cholesterol 155, triglycerides 123, HDL 55, LDL 78. CMET and CBC normal Dated 09/27/21: cholesterol 168, triglycerides 91, HDL 70, LDL 82. CMET normal.    Ecg today shows NSR with first degree AV block and RBBB. Rate 80. No change from prior.  I have personally reviewed and interpreted this study.  Myoview study 07/26/15:Study Highlights   The left ventricular ejection fraction is hyperdynamic (>65%). Nuclear stress EF: 72%. There was no ST segment deviation noted during stress. The study is normal. This is a low risk study.   This is a normal pharmacologic nuclear study with no evidence of prior infarct or ischemia. Normal LVEF.        Assessment / Plan: 1. CAD - s/p stenting of the LAD and OM in 2005 with first generation Cypher stents. Normal Myoview in June 2017.  She is asymptomatic. Since she has first generation DES we have continued  DAPT indefinitely. May hold Plavix for spine injections.  2. HTN - BP is well controlled.   3. HLD - on statin. LDL 78. Goal 70. On maximal dose of Crestor. Per prior discussion she would like to avoid additional medical therapy.   4. Chronic RBBB since 2014.    5. Peripheral neuropathy  6. Iron deficiency anemia. Improved. Periodic infusions  I will follow up in 6 months

## 2021-10-31 ENCOUNTER — Encounter: Payer: Self-pay | Admitting: Cardiology

## 2021-10-31 ENCOUNTER — Ambulatory Visit: Payer: Medicare PPO | Attending: Cardiology | Admitting: Cardiology

## 2021-10-31 VITALS — BP 120/60 | HR 80 | Ht 64.0 in | Wt 197.4 lb

## 2021-10-31 DIAGNOSIS — I10 Essential (primary) hypertension: Secondary | ICD-10-CM

## 2021-10-31 DIAGNOSIS — I2510 Atherosclerotic heart disease of native coronary artery without angina pectoris: Secondary | ICD-10-CM | POA: Diagnosis not present

## 2021-10-31 DIAGNOSIS — E7800 Pure hypercholesterolemia, unspecified: Secondary | ICD-10-CM | POA: Diagnosis not present

## 2021-11-21 DIAGNOSIS — M5430 Sciatica, unspecified side: Secondary | ICD-10-CM | POA: Diagnosis not present

## 2021-11-21 DIAGNOSIS — M6281 Muscle weakness (generalized): Secondary | ICD-10-CM | POA: Diagnosis not present

## 2021-11-21 DIAGNOSIS — M79606 Pain in leg, unspecified: Secondary | ICD-10-CM | POA: Diagnosis not present

## 2021-11-24 DIAGNOSIS — J069 Acute upper respiratory infection, unspecified: Secondary | ICD-10-CM | POA: Diagnosis not present

## 2021-11-24 DIAGNOSIS — K219 Gastro-esophageal reflux disease without esophagitis: Secondary | ICD-10-CM | POA: Diagnosis not present

## 2021-11-26 DIAGNOSIS — J029 Acute pharyngitis, unspecified: Secondary | ICD-10-CM | POA: Diagnosis not present

## 2021-11-26 DIAGNOSIS — Z6838 Body mass index (BMI) 38.0-38.9, adult: Secondary | ICD-10-CM | POA: Diagnosis not present

## 2021-11-26 DIAGNOSIS — J209 Acute bronchitis, unspecified: Secondary | ICD-10-CM | POA: Diagnosis not present

## 2021-12-01 DIAGNOSIS — J018 Other acute sinusitis: Secondary | ICD-10-CM | POA: Diagnosis not present

## 2021-12-10 ENCOUNTER — Other Ambulatory Visit: Payer: Self-pay | Admitting: Family Medicine

## 2021-12-10 DIAGNOSIS — M5416 Radiculopathy, lumbar region: Secondary | ICD-10-CM

## 2021-12-11 DIAGNOSIS — J208 Acute bronchitis due to other specified organisms: Secondary | ICD-10-CM | POA: Diagnosis not present

## 2021-12-11 DIAGNOSIS — Z6834 Body mass index (BMI) 34.0-34.9, adult: Secondary | ICD-10-CM | POA: Diagnosis not present

## 2021-12-18 ENCOUNTER — Ambulatory Visit
Admission: RE | Admit: 2021-12-18 | Discharge: 2021-12-18 | Disposition: A | Discharge status: Home / Self Care | Payer: Self-pay | Source: Ambulatory Visit | Attending: Family Medicine | Admitting: Family Medicine

## 2021-12-18 ENCOUNTER — Other Ambulatory Visit: Payer: Self-pay | Admitting: Family Medicine

## 2021-12-18 DIAGNOSIS — M5416 Radiculopathy, lumbar region: Secondary | ICD-10-CM

## 2021-12-24 ENCOUNTER — Other Ambulatory Visit: Payer: Self-pay | Admitting: Family Medicine

## 2021-12-24 ENCOUNTER — Ambulatory Visit
Admission: RE | Admit: 2021-12-24 | Discharge: 2021-12-24 | Disposition: A | Discharge status: Home / Self Care | Payer: Medicare PPO | Source: Ambulatory Visit | Attending: Family Medicine | Admitting: Family Medicine

## 2021-12-24 DIAGNOSIS — M5416 Radiculopathy, lumbar region: Secondary | ICD-10-CM

## 2021-12-24 DIAGNOSIS — M4727 Other spondylosis with radiculopathy, lumbosacral region: Secondary | ICD-10-CM | POA: Diagnosis not present

## 2021-12-24 MED ORDER — IOPAMIDOL (ISOVUE-M 200) INJECTION 41%
1.0000 mL | Freq: Once | INTRAMUSCULAR | Status: AC
  Administered 2021-12-24: 1 mL via EPIDURAL

## 2021-12-24 MED ORDER — METHYLPREDNISOLONE ACETATE 40 MG/ML INJ SUSP (RADIOLOG
80.0000 mg | Freq: Once | INTRAMUSCULAR | Status: AC
  Administered 2021-12-24: 80 mg via EPIDURAL

## 2021-12-24 NOTE — Discharge Instructions (Signed)

## 2022-01-23 ENCOUNTER — Telehealth: Payer: Self-pay | Admitting: Internal Medicine

## 2022-01-23 NOTE — Telephone Encounter (Signed)
Patient called, she did not want to express what she was calling about. Please advise, patient wanting to speak to a nurse.

## 2022-01-23 NOTE — Telephone Encounter (Signed)
Patient called with concerns that she was unable to find Dr Vena Rua name on her mychart account so that she can send messages when needed. I advised that if she has not seen a provider in 1 year or more, MyChart takes that provider name off their provider list. Advised patient she was last seen on in 12/2020 so this was likely the case. Patient indicates that she is doing well but would like to follow with Dr Hilarie Fredrickson every 6 months. Appointment made for first available, 04/02/22.

## 2022-01-30 DIAGNOSIS — H52203 Unspecified astigmatism, bilateral: Secondary | ICD-10-CM | POA: Diagnosis not present

## 2022-01-30 DIAGNOSIS — Z961 Presence of intraocular lens: Secondary | ICD-10-CM | POA: Diagnosis not present

## 2022-02-04 ENCOUNTER — Other Ambulatory Visit: Payer: Self-pay | Admitting: Obstetrics and Gynecology

## 2022-02-04 DIAGNOSIS — Z1231 Encounter for screening mammogram for malignant neoplasm of breast: Secondary | ICD-10-CM

## 2022-02-05 ENCOUNTER — Other Ambulatory Visit: Payer: Self-pay | Admitting: Obstetrics and Gynecology

## 2022-02-05 DIAGNOSIS — M8580 Other specified disorders of bone density and structure, unspecified site: Secondary | ICD-10-CM

## 2022-02-06 ENCOUNTER — Encounter: Payer: Self-pay | Admitting: Internal Medicine

## 2022-02-08 ENCOUNTER — Emergency Department (HOSPITAL_BASED_OUTPATIENT_CLINIC_OR_DEPARTMENT_OTHER): Payer: Medicare PPO

## 2022-02-08 ENCOUNTER — Emergency Department (HOSPITAL_BASED_OUTPATIENT_CLINIC_OR_DEPARTMENT_OTHER)
Admission: EM | Admit: 2022-02-08 | Discharge: 2022-02-08 | Disposition: A | Discharge status: Home / Self Care | Payer: Medicare PPO | Attending: Emergency Medicine | Admitting: Emergency Medicine

## 2022-02-08 ENCOUNTER — Other Ambulatory Visit: Payer: Self-pay

## 2022-02-08 ENCOUNTER — Encounter (HOSPITAL_BASED_OUTPATIENT_CLINIC_OR_DEPARTMENT_OTHER): Payer: Self-pay | Admitting: Emergency Medicine

## 2022-02-08 DIAGNOSIS — J45909 Unspecified asthma, uncomplicated: Secondary | ICD-10-CM | POA: Diagnosis not present

## 2022-02-08 DIAGNOSIS — Z79899 Other long term (current) drug therapy: Secondary | ICD-10-CM | POA: Diagnosis not present

## 2022-02-08 DIAGNOSIS — Z7982 Long term (current) use of aspirin: Secondary | ICD-10-CM | POA: Diagnosis not present

## 2022-02-08 DIAGNOSIS — I10 Essential (primary) hypertension: Secondary | ICD-10-CM | POA: Insufficient documentation

## 2022-02-08 DIAGNOSIS — Z8679 Personal history of other diseases of the circulatory system: Secondary | ICD-10-CM | POA: Diagnosis not present

## 2022-02-08 DIAGNOSIS — R519 Headache, unspecified: Secondary | ICD-10-CM | POA: Diagnosis not present

## 2022-02-08 DIAGNOSIS — R42 Dizziness and giddiness: Secondary | ICD-10-CM | POA: Insufficient documentation

## 2022-02-08 LAB — BASIC METABOLIC PANEL
Anion gap: 10 (ref 5–15)
BUN: 16 mg/dL (ref 8–23)
CO2: 25 mmol/L (ref 22–32)
Calcium: 9.7 mg/dL (ref 8.9–10.3)
Chloride: 101 mmol/L (ref 98–111)
Creatinine, Ser: 0.68 mg/dL (ref 0.44–1.00)
GFR, Estimated: >60 mL/min (ref >60)
Glucose, Bld: 100 mg/dL — ABNORMAL HIGH (ref 70–99)
Potassium: 3.7 mmol/L (ref 3.5–5.1)
Sodium: 136 mmol/L (ref 135–145)

## 2022-02-08 LAB — CBC
HCT: 44.2 % (ref 36.0–46.0)
Hemoglobin: 15 g/dL (ref 12.0–15.0)
MCH: 32.3 pg (ref 26.0–34.0)
MCHC: 33.9 g/dL (ref 30.0–36.0)
MCV: 95.1 fL (ref 80.0–100.0)
Platelets: 204 10*3/uL (ref 150–400)
RBC: 4.65 MIL/uL (ref 3.87–5.11)
RDW: 12.9 % (ref 11.5–15.5)
WBC: 7.9 10*3/uL (ref 4.0–10.5)
nRBC: 0 % (ref 0.0–0.2)

## 2022-02-08 LAB — URINALYSIS, ROUTINE W REFLEX MICROSCOPIC
Bilirubin Urine: NEGATIVE
Glucose, UA: NEGATIVE mg/dL
Ketones, ur: NEGATIVE mg/dL
Leukocytes,Ua: NEGATIVE
Nitrite: NEGATIVE
Protein, ur: NEGATIVE mg/dL
Specific Gravity, Urine: 1.02 (ref 1.005–1.030)
pH: 7 (ref 5.0–8.0)

## 2022-02-08 LAB — URINALYSIS, MICROSCOPIC (REFLEX)

## 2022-02-08 MED ORDER — MECLIZINE HCL 25 MG PO TABS
25.0000 mg | ORAL_TABLET | Freq: Once | ORAL | Status: AC
  Administered 2022-02-08: 25 mg via ORAL
  Filled 2022-02-08: qty 1

## 2022-02-08 MED ORDER — MECLIZINE HCL 25 MG PO TABS: 25.0000 mg | ORAL_TABLET | Freq: Three times a day (TID) | ORAL | 0 refills | Status: DC | PRN

## 2022-02-08 NOTE — ED Triage Notes (Signed)
Reports dizziness this Am at 5 am , went to PCP, reports possible fluids in both ears ,  Reports nausea . Denies Hx vertigo  Alert and oriented x 4 , uses cane and walker normally .

## 2022-02-08 NOTE — ED Provider Notes (Signed)
[facility] EMERGENCY DEPARTMENT Provider Note   CSN: [id number] Arrival date & time: 02/08/22  1323     History {Add pertinent medical, surgical, social history, OB history to HPI:1} Chief Complaint  Patient presents with   Dizziness    Pamela Gonzales is a 89 y.o. female.  HPI     88 year old female with a history of coronary artery disease (stenting of LAD and OM 2005), hyperlipidemia, lupus, GERD, RBBB who presents with concern for dizziness.     Taking '500mg'$  tylenol, takes them every 6 hours for back pain  Went to the bathroom during the night (thinks maybe around [place]) and was ok, 5AM went to the bathroom was dizzy, stumbling around.  Has not moved around a great deal now because afraid to. Went to after hours clinic [facility] from [place], could not compare ECG to before. Didn't walk without walker today, feels off  balance when getting up to move around  No numbness, weakness, no facial droop, visual changes,no noted difficulty coordinating, no speech problems  Feels more like an off balance, worse with moving head quickly. Goes away completely with rest, worse with moving head, changing positoins  Mild headache now Slight nausea, no vomiting No cp or dyspnea No diarrhea or black or bloody stools, fevers, cough, no runny nose, no ear symptoms nor sore throat   Past Medical History:  Diagnosis Date   Anemia    Arthritis    Asthmatic bronchitis    Atherosclerotic heart disease of native coronary artery without angina pectoris    Benign fundic gland polyps of stomach    CAD (coronary artery disease) 05   stents   Daytime somnolence    Diverticulosis    Esophageal dysmotility    Fibula fracture    right   Gallstones    Gastric erosions    GERD (gastroesophageal reflux disease)    Hiatal hernia    Hypercholesteremia    Hypertension    Internal hemorrhoids    Joint stiffness    Lupus (HCC)    Osteoarthritis    Osteoporosis    Paresthesia     Peripheral neuropathy    Peripheral neuropathy    RBBB    Reactive airways dysfunction syndrome (HCC)    Sciatica    Seasonal allergies    Spinal stenosis    Tubular adenoma of colon      Home Medications Prior to Admission medications   Medication Sig Start Date End Date Taking? Authorizing Provider  acetaminophen (TYLENOL) 500 MG tablet Take 500 mg by mouth 3 (three) times daily. Patient takes 4 times daily.    [provider]  amLODipine (NORVASC) 5 MG tablet TAKE 1 TABLET BY MOUTH EVERY DAY 09/17/21   Martinique, Peter M, MD  aspirin 81 MG tablet Take 81 mg by mouth daily.    [provider]  Coenzyme Q10 (CO Q 10 PO) Take 200 mg by mouth daily.    [provider]  dextromethorphan-guaiFENesin (MUCINEX DM) 30-600 MG 12hr tablet Take 2 tablets by mouth daily.    [provider]  losartan (COZAAR) 100 MG tablet TAKE 1 TABLET BY MOUTH EVERY DAY 06/21/21   Martinique, Peter M, MD  Multiple Vitamins-Minerals (MULTIVITAMIN WITH MINERALS) tablet Take 1 tablet by mouth daily.    [provider]  MYRBETRIQ 50 MG TB24 tablet Take 50 mg by mouth daily.  11/03/13   [provider]  NASONEX 50 MCG/ACT nasal spray 2 sprays by Nasal route  Daily. 03/15/10   [provider]  pantoprazole (PROTONIX) 40 MG tablet TAKE 1 TABLET BY MOUTH EVERY DAY 05/15/21   Pyrtle, Lajuan Lines, MD  rosuvastatin (CRESTOR) 40 MG tablet TAKE 1 TABLET BY MOUTH EVERY DAY 09/17/21   Martinique, Peter M, MD      Allergies    Cephalexin, Tramadol, Lisinopril, Oxycodone, Penicillins, Sulfa antibiotics, and Sulfa drugs cross reactors    Review of Systems   Review of Systems  Physical Exam Updated Vital Signs BP (!) 157/78 (BP Location: Left Arm)   Pulse 86   Temp 97.7 F (36.5 C) (Oral)   Resp 16   Wt 90.7 kg   SpO2 97%   BMI 34.33 kg/m  Physical Exam  ED Results / Procedures / Treatments   Labs (all labs ordered are listed, but only abnormal results are displayed) Labs  Reviewed  BASIC METABOLIC PANEL - Abnormal; Notable for the following components:      Result Value   Glucose, Bld 100 (*)    All other components within normal limits  CBC  URINALYSIS, ROUTINE W REFLEX MICROSCOPIC  CBG MONITORING, ED    EKG EKG Interpretation  Date/Time:  Friday February 08 2022 13:43:47 EST Ventricular Rate:  85 PR Interval:  211 QRS Duration: 154 QT Interval:  403 QTC Calculation: 480 R Axis:   57 Text Interpretation: Sinus rhythm Right bundle branch block No significant change since last tracing Confirmed by Gareth Morgan [phone]) on 02/08/2022 3:59:26 PM  Radiology No results found.  Procedures Procedures  {Document cardiac monitor, telemetry assessment procedure when appropriate:1}  Medications Ordered in ED Medications - No data to display  ED Course/ Medical Decision Making/ A&P   {   Click here for ABCD2, HEART and other calculatorsREFRESH Note before signing :1}                          Medical Decision Making  ***  {Document critical care time when appropriate:1} {Document review of labs and clinical decision tools ie heart score, Chads2Vasc2 etc:1}  {Document your independent review of radiology images, and any outside records:1} {Document your discussion with family members, caretakers, and with consultants:1} {Document social determinants of health affecting pt's care:1} {Document your decision making why or why not admission, treatments were needed:1} Final Clinical Impression(s) / ED Diagnoses Final diagnoses:  None    Rx / DC Orders ED Discharge Orders     None

## 2022-02-09 ENCOUNTER — Encounter: Payer: Self-pay | Admitting: Cardiology

## 2022-02-11 ENCOUNTER — Telehealth: Payer: Self-pay | Admitting: Cardiology

## 2022-02-11 NOTE — Telephone Encounter (Signed)
Spoke to patient she stated everything taken care of.She will call PCP tomorrow to get a referral to ENT.Stated their office is closed today.

## 2022-02-11 NOTE — Telephone Encounter (Signed)
Patient is requesting call back in regards to results.

## 2022-02-11 NOTE — Telephone Encounter (Signed)
Martinique, Peter M, MD  to Kathreen Devoid, RN  Golden Hurter D, LPN     05/27/12  8:40 AM I reviewed her Ecg, lab tests and CT from 1/12 and see no concern. She can be scheduled for routine follow up with me in 3 months. Can touch base with her PCP to see if ENT referral needed.  Peter Martinique MD, [facility]   Returned call to patient and made her aware of the above. 3 month follow up scheduled for March 27th. Patient very grateful for return call.   Advised patient to call back to office with any issues, questions, or concerns. Patient verbalized understanding.

## 2022-03-06 ENCOUNTER — Other Ambulatory Visit: Payer: Self-pay | Admitting: Family Medicine

## 2022-03-06 DIAGNOSIS — R2681 Unsteadiness on feet: Secondary | ICD-10-CM | POA: Diagnosis not present

## 2022-03-06 DIAGNOSIS — M5430 Sciatica, unspecified side: Secondary | ICD-10-CM

## 2022-03-06 DIAGNOSIS — M5416 Radiculopathy, lumbar region: Secondary | ICD-10-CM

## 2022-03-13 DIAGNOSIS — R350 Frequency of micturition: Secondary | ICD-10-CM | POA: Diagnosis not present

## 2022-03-13 DIAGNOSIS — N3946 Mixed incontinence: Secondary | ICD-10-CM | POA: Diagnosis not present

## 2022-03-22 ENCOUNTER — Inpatient Hospital Stay: Admission: RE | Admit: 2022-03-22 | Payer: Medicare PPO | Source: Ambulatory Visit

## 2022-03-26 ENCOUNTER — Ambulatory Visit
Admission: RE | Admit: 2022-03-26 | Discharge: 2022-03-26 | Disposition: A | Discharge status: Home / Self Care | Payer: Medicare PPO | Source: Ambulatory Visit | Attending: Obstetrics and Gynecology | Admitting: Obstetrics and Gynecology

## 2022-03-26 DIAGNOSIS — Z1231 Encounter for screening mammogram for malignant neoplasm of breast: Secondary | ICD-10-CM

## 2022-04-01 DIAGNOSIS — M4310 Spondylolisthesis, site unspecified: Secondary | ICD-10-CM | POA: Diagnosis not present

## 2022-04-01 DIAGNOSIS — Z9989 Dependence on other enabling machines and devices: Secondary | ICD-10-CM | POA: Diagnosis not present

## 2022-04-01 DIAGNOSIS — M5430 Sciatica, unspecified side: Secondary | ICD-10-CM | POA: Diagnosis not present

## 2022-04-01 DIAGNOSIS — Z6833 Body mass index (BMI) 33.0-33.9, adult: Secondary | ICD-10-CM | POA: Diagnosis not present

## 2022-04-01 DIAGNOSIS — M4800 Spinal stenosis, site unspecified: Secondary | ICD-10-CM | POA: Diagnosis not present

## 2022-04-02 ENCOUNTER — Ambulatory Visit (INDEPENDENT_AMBULATORY_CARE_PROVIDER_SITE_OTHER): Payer: Medicare PPO | Admitting: Internal Medicine

## 2022-04-02 ENCOUNTER — Encounter: Payer: Self-pay | Admitting: Internal Medicine

## 2022-04-02 VITALS — BP 100/60 | HR 88 | Ht 64.0 in | Wt 195.0 lb

## 2022-04-02 DIAGNOSIS — M6289 Other specified disorders of muscle: Secondary | ICD-10-CM

## 2022-04-02 DIAGNOSIS — K5904 Chronic idiopathic constipation: Secondary | ICD-10-CM

## 2022-04-02 DIAGNOSIS — K449 Diaphragmatic hernia without obstruction or gangrene: Secondary | ICD-10-CM

## 2022-04-02 DIAGNOSIS — Z862 Personal history of diseases of the blood and blood-forming organs and certain disorders involving the immune mechanism: Secondary | ICD-10-CM | POA: Diagnosis not present

## 2022-04-02 DIAGNOSIS — K6289 Other specified diseases of anus and rectum: Secondary | ICD-10-CM

## 2022-04-02 DIAGNOSIS — K219 Gastro-esophageal reflux disease without esophagitis: Secondary | ICD-10-CM | POA: Diagnosis not present

## 2022-04-02 DIAGNOSIS — K5900 Constipation, unspecified: Secondary | ICD-10-CM

## 2022-04-02 NOTE — Patient Instructions (Signed)
_______________________________________________________  If your blood pressure at your visit was 140/90 or greater, please contact your primary care physician to follow up on this.  _______________________________________________________  If you are age 87 or older, your body mass index should be between 23-30. Your Body mass index is 33.47 kg/m. If this is out of the aforementioned range listed, please consider follow up with your Primary Care Provider.  If you are age 100 or younger, your body mass index should be between 19-25. Your Body mass index is 33.47 kg/m. If this is out of the aformentioned range listed, please consider follow up with your Primary Care Provider.   ________________________________________________________  The [facility] providers would like to encourage you to use [facility] to communicate with providers for non-urgent requests or questions.  Due to long hold times on the telephone, sending your provider a message by [facility] may be a faster and more efficient way to get a response.  Please allow 48 business hours for a response.  Please remember that this is for non-urgent requests.  _______________________________________________________  Continue Pantoprazole   Start Miralax 17 g daily over the counter

## 2022-04-04 ENCOUNTER — Encounter: Payer: Self-pay | Admitting: Internal Medicine

## 2022-04-04 NOTE — Progress Notes (Signed)
Subjective:    Patient ID: Pamela Gonzales, female    DOB: [birth year], 89 y.o.   MRN: [id number]  HPI Zeda Waclawski is an 89 year old female with a past medical history of GERD with complex hiatal hernia and Cameron's erosions, IDA felt secondary to hiatal hernia, history of gastritis, chronic constipation, diverticulosis and remote colon polyps who is here for follow-up.  She was last seen here in December 2022 and she is here alone today.   She reports that she is doing well.  She still has issues with constipation and pressure sensation in her rectum.  She tried the [employer] but this did not seem to help much.  She has determined if she leans forward and spread to her legs this significantly helps with defecation.  She also knows that eating an apple first in the morning helps with her constipation as well.  She has been using MiraLAX intermittently but not daily.  She will still get some lower abdominal discomfort before and after bowel movement.  There is no blood in stool or melena.  She uses [facility] for perianal soreness associated with straining for defecation.  Pantoprazole is working great for her no dysphagia symptom.  She does notice some pyrosis if she eats spicy foods and goes to bed early.  Review of Systems As per HPI, otherwise negative  Current Medications, Allergies, Past Medical History, Past Surgical History, Family History and Social History were reviewed in [employer] record.    Objective:   Physical Exam BP 100/60   Pulse 88   Ht '5\' 4"'$  (1.626 m)   Wt 195 lb (88.5 kg)   SpO2 98%   BMI 33.47 kg/m  Gen: awake, alert, NAD HEENT: anicteric  Abd: soft, NT/ND, +BS throughout Ext: no c/c/e Neuro: nonfocal    Latest Ref Rng & Units 02/08/2022    3:45 PM 04/02/2021    1:17 PM 12/28/2020   10:53 AM  CBC  WBC 4.0 - 10.5 K/uL 7.9  10.5  7.7   Hemoglobin 12.0 - 15.0 g/dL 15.0  13.7  12.1   Hematocrit 36.0 - 46.0 % 44.2  41.3  37.2   Platelets  150 - 400 K/uL 204  198.0  255.0    CMP     Component Value Date/Time   NA 136 02/08/2022 1545   NA 141 07/20/2019 0811   K 3.7 02/08/2022 1545   CL 101 02/08/2022 1545   CO2 25 02/08/2022 1545   GLUCOSE 100 (H) 02/08/2022 1545   BUN 16 02/08/2022 1545   BUN 12 07/20/2019 0811   CREATININE 0.68 02/08/2022 1545   CREATININE 0.89 (H) 07/08/2016 0838   CALCIUM 9.7 02/08/2022 1545   PROT 6.5 07/20/2019 0811   ALBUMIN 4.2 07/20/2019 0811   AST 24 07/20/2019 0811   ALT 15 07/20/2019 0811   ALKPHOS 62 07/20/2019 0811   BILITOT 0.7 07/20/2019 0811   GFRNONAA >60 02/08/2022 1545   GFRAA 88 07/20/2019 0811         Assessment & Plan:  89 year old female with a past medical history of GERD with complex hiatal hernia and Cameron's erosions, IDA felt secondary to hiatal hernia, history of gastritis, chronic constipation, diverticulosis and remote colon polyps who is here for follow-up.   Chronic constipation/dyssynergy defecation/pelvic floor dysfunction --I reminded her that MiraLAX does better if she uses this on a daily basis.  We discussed how it is an osmotic laxative and not a spasmodic.  Continue high-fiber diet  and body positioning to aid with defecation -- MiraLAX 17 g daily  2.  GERD with history of hiatal hernia and Cameron's erosions --well-controlled with pantoprazole, given her history of IDA associated with hiatal hernia and continuous PPI is recommended -- Continue pantoprazole 40 mg daily  3.  IDA secondary to #2 --resolved.  Previous IV iron though blood counts have remained normal.  4.  Hemorrhoids and perianal soreness --associated with #1.  MiraLAX should help.  [facility] as needed.  33-monthfollow-up with me, sooner if needed

## 2022-04-14 ENCOUNTER — Encounter: Payer: Self-pay | Admitting: Internal Medicine

## 2022-04-20 NOTE — Progress Notes (Unsigned)
Pamela Gonzales Date of Birth: 01-11-1931 Medical Record [id number]  History of Present Illness: Pamela Gonzales is seen back today for follow up of CAD. She has a history of HLD, Lupus, GERD, OA, RBBB and CAD with past stents placed in 2005. She had stenting of the mid LAD and 2nd OM in 2005 with Cypher stents. Last Myoview was in June 2017 and was normal.   She is followed by Dr Hilarie Fredrickson for GERD, constipation and iron deficiency anemia felt to be partly due to [facility] lesions/hiatal hernia. Iron deficiency corrected with infusions. Last infusion in Dec.   She was seen in the ED in Jan with dizziness. Ecg unchanged. CT head negative. Treated with meclizine. She has had no recurrent dizziness.   On follow up today she is doing well from a cardiac standpoint.   She still  does water aerobics. She has progressive neuropathy in her feet that limits her activity.  She uses a walker. Has injections in her back periodically.  She denies any chest pain or SOB.   Current Outpatient Medications  Medication Sig Dispense Refill   acetaminophen (TYLENOL) 500 MG tablet Take 500 mg by mouth 3 (three) times daily. Patient takes 4 times daily.     amLODipine (NORVASC) 5 MG tablet TAKE 1 TABLET BY MOUTH EVERY DAY 90 tablet 3   aspirin 81 MG tablet Take 81 mg by mouth daily.     Coenzyme Q10 (CO Q 10 PO) Take 200 mg by mouth daily.     dextromethorphan-guaiFENesin (MUCINEX DM) 30-600 MG 12hr tablet Take 2 tablets by mouth daily.     losartan (COZAAR) 100 MG tablet TAKE 1 TABLET BY MOUTH EVERY DAY 90 tablet 3   Multiple Vitamins-Minerals (MULTIVITAMIN WITH MINERALS) tablet Take 1 tablet by mouth daily.     MYRBETRIQ 50 MG TB24 tablet Take 50 mg by mouth daily.   2   NASONEX 50 MCG/ACT nasal spray 2 sprays by Nasal route Daily.     pantoprazole (PROTONIX) 40 MG tablet TAKE 1 TABLET BY MOUTH EVERY DAY 90 tablet 1   rosuvastatin (CRESTOR) 40 MG tablet TAKE 1 TABLET BY MOUTH EVERY DAY 90 tablet 3   No current  facility-administered medications for this visit.    Allergies  Allergen Reactions   Cephalexin Other (See Comments)    "terrible abdominal pain"   Tramadol Other (See Comments)    Per pt "hallucinations"    Lisinopril Cough   Oxycodone Itching   Penicillins Other (See Comments)    "Pt reports "knots on my arms"   Sulfa Antibiotics Other (See Comments) and Rash    Skin irritation.   Sulfa Drugs Cross Reactors Other (See Comments)    Skin irritation.    Past Medical History:  Diagnosis Date   Anemia    Arthritis    Asthmatic bronchitis    Atherosclerotic heart disease of native coronary artery without angina pectoris    Benign fundic gland polyps of stomach    CAD (coronary artery disease) 05   stents   Daytime somnolence    Diverticulosis    Esophageal dysmotility    Fibula fracture    right   Gallstones    Gastric erosions    GERD (gastroesophageal reflux disease)    Hiatal hernia    Hypercholesteremia    Hypertension    Internal hemorrhoids    Joint stiffness    Lupus (HCC)    Osteoarthritis    Osteoporosis    Paresthesia  Peripheral neuropathy    Peripheral neuropathy    RBBB    Reactive airways dysfunction syndrome ([place])    Sciatica    Seasonal allergies    Spinal stenosis    Tubular adenoma of colon     Past Surgical History:  Procedure Laterality Date   ABDOMINAL HYSTERECTOMY  1975   BREAST EXCISIONAL BIOPSY Right    1972 benign   CATARACT EXTRACTION, BILATERAL     CHOLECYSTECTOMY  07/04/2011   Procedure: LAPAROSCOPIC CHOLECYSTECTOMY WITH INTRAOPERATIVE CHOLANGIOGRAM;  Surgeon: Merrie Roof, MD;  Location: [facility];  Service: General;  Laterality: N/A;   COLONOSCOPY W/ BIOPSIES     CORONARY ANGIOPLASTY  2005   ESOPHAGOGASTRODUODENOSCOPY (EGD) WITH PROPOFOL N/A 06/11/2016   Procedure: ESOPHAGOGASTRODUODENOSCOPY (EGD) WITH PROPOFOL;  Surgeon: Garlan Fair, MD;  Location: [facility];  Service: Endoscopy;  Laterality: N/A;   FIBULA  FRACTURE SURGERY  2011   Right Ankle   FLEXIBLE SIGMOIDOSCOPY N/A 07/09/2016   Procedure: FLEXIBLE SIGMOIDOSCOPY;  Surgeon: Garlan Fair, MD;  Location: [facility];  Service: Endoscopy;  Laterality: N/A;   KNEE ARTHROSCOPY Right 03/15/2019   Procedure: Right knee arthroscopy; meniscal debridement chondroplasty;  Surgeon: Gaynelle Arabian, MD;  Location: [facility];  Service: Orthopedics;  Laterality: Right;  26min    Social History   Tobacco Use  Smoking Status Never  Smokeless Tobacco Never    Social History   Substance and Sexual Activity  Alcohol Use Yes   Comment: very rare    Family History  Problem Relation Age of Onset   Lung cancer Father    Heart attack Mother    Heart attack Brother    Heart disease Brother    Rectal cancer Brother        only 1 kidney   Cancer Sister    Pancreatic cancer Sister    Heart disease Sister    Anesthesia problems Neg Hx    Hypotension Neg Hx    Malignant hyperthermia Neg Hx    Pseudochol deficiency Neg Hx    Breast cancer Neg Hx    Colon cancer Neg Hx    Stomach cancer Neg Hx     Review of Systems: The review of systems is per the HPI.  All other systems were reviewed and are negative.  Physical Exam: BP 130/64   Pulse 79   Ht 5\' 4"  (1.626 m)   Wt 196 lb 6.4 oz (89.1 kg)   SpO2 96%   BMI 33.71 kg/m  GENERAL:  Well appearing, obese WF in NAD HEENT:  PERRL, EOMI, sclera are clear. Oropharynx is clear. NECK:  No jugular venous distention, carotid upstroke brisk and symmetric, no bruits, no thyromegaly or adenopathy LUNGS:  Clear to auscultation bilaterally CHEST:  Unremarkable HEART:  RRR,  PMI not displaced or sustained,S1 and S2 within normal limits, no S3, no S4: no clicks, no rubs, no murmurs EXT:  2 + pulses throughout, no edema, no cyanosis no clubbing SKIN:  Warm and dry.  No rashes NEURO:  Alert and oriented x 3. Cranial nerves II through XII intact. PSYCH:  Cognitively intact  LABORATORY DATA:  Lab Results   Component Value Date   WBC 7.9 02/08/2022   HGB 15.0 02/08/2022   HCT 44.2 02/08/2022   PLT 204 02/08/2022   GLUCOSE 100 (H) 02/08/2022   CHOL 166 07/20/2019   TRIG 118 07/20/2019   HDL 62 07/20/2019   LDLCALC 83 07/20/2019   ALT 15 07/20/2019   AST  24 07/20/2019   NA 136 02/08/2022   K 3.7 02/08/2022   CL 101 02/08/2022   CREATININE 0.68 02/08/2022   BUN 16 02/08/2022   CO2 25 02/08/2022   INR 1.07 08/03/2009   HGBA1C 5.4 05/10/2010   Dated 08/17/20: cholesterol 155, triglycerides 123, HDL 55, LDL 78. CMET and CBC normal Dated 09/27/21: cholesterol 168, triglycerides 91, HDL 70, LDL 82. CMET normal.   Ecg is not done today  Myoview study 07/26/15:Study Highlights   The left ventricular ejection fraction is hyperdynamic (>65%). Nuclear stress EF: 72%. There was no ST segment deviation noted during stress. The study is normal. This is a low risk study.   This is a normal pharmacologic nuclear study with no evidence of prior infarct or ischemia. Normal LVEF.        Assessment / Plan:  1. CAD - s/p stenting of the LAD and OM in 2005 with first generation Cypher stents. Normal Myoview in June 2017.  She is asymptomatic. We stopped Plavix due to easy bruisability. Continue ASA  2. HTN - BP is well controlled.   3. HLD - on statin. LDL 82.  Goal 70. On maximal dose of Crestor. Would like to avoid additional therapy at her age.   4. Chronic RBBB since 2014.    5. Peripheral neuropathy  6. Iron deficiency anemia. Improved. Hasn't required an iron infusion in 2 years.   I will follow up in 6 months

## 2022-04-24 ENCOUNTER — Ambulatory Visit: Payer: Medicare PPO | Attending: Cardiology | Admitting: Cardiology

## 2022-04-24 ENCOUNTER — Encounter: Payer: Self-pay | Admitting: Cardiology

## 2022-04-24 VITALS — BP 130/64 | HR 79 | Ht 64.0 in | Wt 196.4 lb

## 2022-04-24 DIAGNOSIS — I2510 Atherosclerotic heart disease of native coronary artery without angina pectoris: Secondary | ICD-10-CM

## 2022-04-24 DIAGNOSIS — I10 Essential (primary) hypertension: Secondary | ICD-10-CM

## 2022-04-24 DIAGNOSIS — E7800 Pure hypercholesterolemia, unspecified: Secondary | ICD-10-CM | POA: Diagnosis not present

## 2022-04-24 NOTE — Patient Instructions (Signed)
Medication Instructions:  Continue same medications *If you need a refill on your cardiac medications before your next appointment, please call your pharmacy*   Lab Work: None ordered   Testing/Procedures: None ordered   Follow-Up: At [facility], you and your health needs are our priority.  As part of our continuing mission to provide you with exceptional heart care, we have created designated Provider Care Teams.  These Care Teams include your primary Cardiologist (physician) and Advanced Practice Providers (APPs -  Physician Assistants and Nurse Practitioners) who all work together to provide you with the care you need, when you need it.  We recommend signing up for the patient portal called "MyChart".  Sign up information is provided on this After Visit Summary.  MyChart is used to connect with patients for Virtual Visits (Telemedicine).  Patients are able to view lab/test results, encounter notes, upcoming appointments, etc.  Non-urgent messages can be sent to your provider as well.   To learn more about what you can do with MyChart, go to [email or web address].    Your next appointment:  6 months    Provider:  Dr.Jordan

## 2022-05-07 DIAGNOSIS — M1711 Unilateral primary osteoarthritis, right knee: Secondary | ICD-10-CM | POA: Diagnosis not present

## 2022-05-14 DIAGNOSIS — M1711 Unilateral primary osteoarthritis, right knee: Secondary | ICD-10-CM | POA: Diagnosis not present

## 2022-05-22 ENCOUNTER — Ambulatory Visit: Payer: Medicare PPO | Admitting: Neurology

## 2022-05-22 DIAGNOSIS — M1711 Unilateral primary osteoarthritis, right knee: Secondary | ICD-10-CM | POA: Diagnosis not present

## 2022-05-23 ENCOUNTER — Encounter: Payer: Self-pay | Admitting: Internal Medicine

## 2022-05-25 DIAGNOSIS — M1711 Unilateral primary osteoarthritis, right knee: Secondary | ICD-10-CM | POA: Diagnosis not present

## 2022-06-03 ENCOUNTER — Inpatient Hospital Stay: Admission: RE | Admit: 2022-06-03 | Payer: Medicare PPO | Source: Ambulatory Visit

## 2022-06-04 ENCOUNTER — Encounter: Payer: Self-pay | Admitting: Cardiology

## 2022-06-06 ENCOUNTER — Other Ambulatory Visit: Payer: Self-pay | Admitting: Family Medicine

## 2022-06-06 ENCOUNTER — Ambulatory Visit
Admission: RE | Admit: 2022-06-06 | Discharge: 2022-06-06 | Disposition: A | Discharge status: Home / Self Care | Payer: Medicare PPO | Source: Ambulatory Visit | Attending: Family Medicine | Admitting: Family Medicine

## 2022-06-06 DIAGNOSIS — M5430 Sciatica, unspecified side: Secondary | ICD-10-CM

## 2022-06-06 DIAGNOSIS — M5416 Radiculopathy, lumbar region: Secondary | ICD-10-CM

## 2022-06-06 DIAGNOSIS — M79652 Pain in left thigh: Secondary | ICD-10-CM | POA: Diagnosis not present

## 2022-06-06 DIAGNOSIS — M4726 Other spondylosis with radiculopathy, lumbar region: Secondary | ICD-10-CM | POA: Diagnosis not present

## 2022-06-06 DIAGNOSIS — M545 Low back pain, unspecified: Secondary | ICD-10-CM | POA: Diagnosis not present

## 2022-06-06 MED ORDER — METHYLPREDNISOLONE ACETATE 40 MG/ML INJ SUSP (RADIOLOG
80.0000 mg | Freq: Once | INTRAMUSCULAR | Status: AC
  Administered 2022-06-06: 80 mg via EPIDURAL

## 2022-06-06 MED ORDER — IOPAMIDOL (ISOVUE-M 200) INJECTION 41%
1.0000 mL | Freq: Once | INTRAMUSCULAR | Status: AC
  Administered 2022-06-06: 1 mL via EPIDURAL

## 2022-06-06 NOTE — Discharge Instructions (Signed)

## 2022-06-14 DIAGNOSIS — M1711 Unilateral primary osteoarthritis, right knee: Secondary | ICD-10-CM | POA: Diagnosis not present

## 2022-07-15 ENCOUNTER — Ambulatory Visit: Payer: Medicare PPO | Admitting: Neurology

## 2022-07-18 ENCOUNTER — Ambulatory Visit: Payer: Medicare PPO | Admitting: Neurology

## 2022-07-18 ENCOUNTER — Encounter: Payer: Self-pay | Admitting: Neurology

## 2022-07-18 VITALS — BP 123/53 | HR 74 | Ht 64.0 in | Wt 194.0 lb

## 2022-07-18 DIAGNOSIS — M48062 Spinal stenosis, lumbar region with neurogenic claudication: Secondary | ICD-10-CM

## 2022-07-18 DIAGNOSIS — M5417 Radiculopathy, lumbosacral region: Secondary | ICD-10-CM

## 2022-07-18 DIAGNOSIS — G6289 Other specified polyneuropathies: Secondary | ICD-10-CM

## 2022-07-18 NOTE — Progress Notes (Addendum)
[facility]    Provider:  Dr Lucia Gaskins Requesting Provider: Daisy Floro, MD Primary Care Provider:  Daisy Floro, MD  CC:  lumbar stenosis and radiculopathy follow up  07/18/2022: Left foot still numb, she can still drive, left foot more drop foot and numb. Doesn't trip over it. The back hurts and she gets aching pain down her back and legs. She is managed by [facility] for [facility] alusio and gets ESI at [facility] but not as effective. She takes tylenol daily and has discussed with dr Tenny Craw. Discussed spinal stimulator. She gets tired and has to sit down wen walking I discussed this can happen with lumbar stenosis. That's main problem is get tired and has to sit and that's from the central spinal stenosis so a stimulator wouldn;t help that but we discussed that. She is exercing every day. I do not appreciate much weakness anywhere on exam. She does have sciatica down the back of the left leg and she has discomfort down the both legs in the back but not a shooting pain. Gets in the pool 3x a week.   Patient complains of symptoms per HPI as well as the following symptoms: none . Pertinent negatives and positives per HPI. All others negative   05/21/2021: She went to [facility] and had back injections. She also sees Dr. Tenny Craw as his pcp. She has numbness and tingling in her feet. I evaluated her in 10/15/2019 and discussed neurology really didn't have anymore to offer her. She gets injections regularly and loves Dr. Charise Killian in particular. Occasionally she has a little tingle in the foot, not every day and not in the entire foot, in the left foot. She has leg aches. She remembers as a child with leg aches. Her legs ache. Not every day. [place] lives [place], she uses all the machines. She uses 2 upper body machines, she does some matt work, knee bends and lifts her legs straight up one at a time, she does that every day. She is also doing hamstring exercises and she has been to physical theray many  times (I suggested she follow PT directions on exercises to minimize risk to lower back). She has Charlie her walker, her gait is stable, no falls, no worsening of walking, she uses her walker all the time, she was a patient of dr Newell Coral who refused to do surgery, no significant pain in her low back, tylenol helps, no shooting pain into her legs she has sciatica in the left leg which is stable, she does stetching that helps the sciatica. No other focal neurologic deficits, associated symptoms, inciting events or modifiable factors.  Patient complains of symptoms per HPI as well as the following symptoms: none . Pertinent negatives and positives per HPI. All others negative   HPI 10/15/2019:  Pamela Gonzales is a 89 y.o. female here as requested by Daisy Floro, MD for " please evaluate/provide second opinion for care/treatment.  Also?  Sciatica of lumbar radiculopathy."She has a past medical history of coronary artery disease, hypertension, joint stiffness, arthritis, constipation, sinusitis, reactive airway dysfunction syndrome, sciatica, functional constipation, wheezing, peripheral neuropathy, balance problems, daytime somnolence.  I reviewed Dr. Charlott Rakes notes: patient seen [facility]: Patient is an 89 year old right-handed white female who she seen on and off since January 2012 (this is a note from Dr. Newell Coral at [facility] neurosurgery and spine).  She continues to have difficulties with her low back, lower extremities, she continues to have occasional falls, she  is followed by Dr. Duane Lope is her primary physician and Dr. Peter Swaziland as her cardiologist.  She reports falls and increased low back discomfort, multiple falls for in a year, she does find that her left lower extremity can give way in standing, she occasionally loses control of her left foot, she describes increased numbness and tingling in the left foot and ankle, particularly over the past couple months, overall she  feels her symptoms are worsening.  She uses extra Tylenol as needed.  She exercises regularly doing stretching and flexibility exercises, weight resistance in the gym and water aerobics, she has osteoporosis and does water aerobics.  Examination showed normal rate rhythm, no murmur, no tenderness to palpation over the lumbar spinous processes or paralumbar muscular, she can flex to 90 degrees but limited to extension, straight leg raise was negative bilaterally, neurologically she is 5 out of 5 strength, sensation intact to pinprick in the distal extremities, some decreased vibratory sense in the distal lower extremities, reflexes are 2 at the quads, absent in the gastroc, toes are downgoing, gait shows a slightly flexed posture.  She has stenosis particularly at the at the L4-L5 level, which may be worse with the increasing spondylolisthesis seen on MRI.  Recent lumbar x-rays were repeated and compared to her x-rays of May 2017 and the MRI was done in January 2012: She has significant facet arthropathy in the lower spine area, and a grade 2 spondylolisthesis on L4-L5 and a grade 2 spondylolisthesis at L5-S1.  In comparison to the x-rays done 20 months ago, the L5-S1 spondylolisthesis is unchanged but the L4-L5 spondylolisthesis has increased.  They are relatively static through flexion and extension there is disc space narrowing at the L5-S1 level.   Impression from Dr. Newell Coral, note review:  so the patient has some falls, she says her lower extremities can give way, on exam she has intact strength, may be some mild distal peripheral neuropathy, it is unclear with contributing to this patient's falls, possible peripheral neuropathy not being fully aware of where her feet are rather than weakness, which we do not find an examination.  They asked for another MRI as the last one was done 7 years ago however he did not think surgical intervention was indicated despite the progressive degeneration the surgery would  be quite extensive and certainly she would need to be off Plavix for at least 3 weeks perioperatively.  They did repeat the MRI.  Of note patient was seeing Dr. Allena Katz who is a neuromuscular specialist and neurology and was last seen about a year ago. I also reviewed these notes: Vitamin B12 and TSH levels were checked which were normal.  MRI of the lumbar spine showed severe lumbar canal stenosis at L4-L5 and severe by foraminal stenosis at L5-S1.  She had an EMG nerve conduction study which confirms the presence of a sensorimotor polyneuropathy as well as bilateral S1 radiculopathy.  No signs of progression.  Patient is here alone ans she reports she has not had many falls. She is here because of her neuropathy.  She did not feel going to other doctors in the past was very helpful, however now she is at [facility] and feels as though this is helpful.  She reports she was diagnosed with peripheral neuropathy by Dr. Allena Katz, she also understand she has significant low back issues, her business concern is left foot numbness, continuous, she has not had many falls. She has peripheral neuropathy and wants to know what that means  and what she can do about it. She feels like she is getting the right treatment for her back. She is really happy with Dr. Ethelene Hal' team.  However her symptoms have progressed over the years and she is very concerned, she thinks she needs another opinion on her peripheral neuropathy. She has not had a fall in 2 years. Diagnosed with peripheral neuropathy by Dr. Allena Katz as well as other physicians.  She went to see Dr. Allena Katz about her neck. The left foot is the primary problem started getting "thick" in 2018.  Progressively worsened.  At that time she did have a few falls. Primarily the left foot, the entire foot, The whole foot. When she stands on solid floor, or hard woods, her left foot becomes immobile and goes up into the leg to her knee however she cannot say and what pattern,. She feels  like she lost use of it. Right foot is not an issue. Worse with standing. Also worse with walking on pavement. A shot helped with the sciatic pain but now with the numbness in the foot.  She denies any other weakness.  No significant problems with the other foot.  We had extensive extended visit today please see assessment and plan.  Reviewed notes, labs and imaging from outside physicians, which showed:  08/2017: b1 normal, no monoclonal protein detected    reviewed images and agree also reviewed images with patient in the office together   MRI Lumbar Spine 2012: L1-2: Mild disc bulge without central canal or foraminal stenosis.   L2-3: Facet arthropathy is noted. No disc bulge or protrusion. Central canal and foramina are open.   L3-4: Mild disc bulge with some ligamentum flavum thickening and facet arthropathy. Central canal is mildly narrowed. Foramina are also mildly narrowed.   L4-5: The patient has new small bilateral facet joint effusions. Ligamentum flavum is thickened and there is a broad-based disc bulge causing moderate to moderately severe central canal stenosis with narrowing of both lateral recesses. Moderate bilateral foraminal narrowing is identified. Foraminal narrowing has progressed.   L5-S1: Severe facet degenerative change is seen and unchanged. Ligamentum flavum thickening is also again noted. The disc is uncovered with a bulge. Moderate central canal stenosis is present. Anterolisthesis results in bilateral foraminal narrowing which appears unchanged.   IMPRESSION:   1. New small bilateral facet joint effusions at L4-5. Disc bulging and ligamentum flavum thickening at this level result in moderate to moderately severe central canal stenosis without marked change. Foraminal narrowing has progressed at this level. 2. No change in mild anterolisthesis of L5 on S1. Bilateral foraminal narrowing and moderate central canal stenosis at this level appear  unchanged. 3. Mild central canal narrowing L3-4 due to disc bulging and mild ligamentum flavum thickening.  MRI lumbar spine 01/2017:  Degenerative changes at L1-2 and L2-3 levels, mild disc bulbing and canal stenosis at L3-4 level.  Significant degeneration at L4-5 and L5-S1, progressed from 2012.  At L4-5 level, there is severe hypertrophic facet arthropathy, ligamentum flavum thickening, circumfrential disc bulbing, and marked to severe multifactorial canal stenosis.  At L5-S1, there is circumfrential disc bulbing, hypertrophic facet atrhropathy, ligamentum flavum thickening, and mild canal but marked to severe lateral recess stenosis bilaterally at L5-S1 level. Labs 04/28/2017:  TSH 1.89, vitamin B12 1140   EMG/NCS: 08/26/2017:  [place], [place] [address] Tel: [phone] Fax:  [phone] Test Date:  08/26/2017   Patient: Pamela Gonzales DOB: 18-Oct-1930 Physician: Nita Sickle, DO  Sex: Female Height: 5\' 4"   Ref Phys: Nita Sickle, DO  ID#: [id number] Temp: 34.9C Technician:      Patient Complaints: This is an 89 year old female referred for evaluation of bilateral feet numbness and imbalance.   NCV & EMG Findings: Extensive electrodiagnostic testing of the right lower extremity and additional studies of the left shows:  Bilateral sural and superficial peroneal sensory responses are absent. Bilateral peroneal motor responses are within normal limits. Bilateral tibial motor responses show reduced amplitude (R2.0, L3.6 mV).   Bilateral tibial H reflex studies shows prolonged latencies.  Chronic motor axon loss changes are seen affecting the muscles below the knee and biceps femoris short head muscles bilaterally.  There is no evidence of active denervation.    Impression: The electrophysiologic findings are most consistent with a chronic and symmetric sensorimotor axonal polyneuropathy affecting the lower extremities. There is a superimposed chronic S1 radiculopathy affecting bilateral lower  extremities, mild in degree electrically.     ___________________________ Nita Sickle, DO       Nerve Conduction Studies Anti Sensory Summary Table    Site NR Peak (ms) Norm Peak (ms) P-T Amp (V) Norm P-T Amp  Left Sup Peroneal Anti Sensory (Ant Lat Mall)  34.9C  12 cm NR   <4.6   >3  Right Sup Peroneal Anti Sensory (Ant Lat Mall)  34.9C  12 cm NR   <4.6   >3  Left Sural Anti Sensory (Lat Mall)  34.9C  Calf NR   <4.6   >3  Right Sural Anti Sensory (Lat Mall)  34.9C  Calf NR   <4.6   >3    Motor Summary Table    Site NR Onset (ms) Norm Onset (ms) O-P Amp (mV) Norm O-P Amp Site1 Site2 Delta-0 (ms) Dist (cm) Vel (m/s) Norm Vel (m/s)  Left Peroneal Motor (Ext Dig Brev)  34.9C  Ankle    3.5 <6.0 2.7 >2.5 B Fib Ankle 8.0 34.0 43 >40  B Fib    11.5   2.4   Poplt B Fib 1.3 8.0 62 >40  Poplt    12.8   2.1                Right Peroneal Motor (Ext Dig Brev)  34.9C  Ankle    3.4 <6.0 2.8 >2.5 B Fib Ankle 7.1 36.0 51 >40  B Fib    10.5   2.4   Poplt B Fib 1.0 8.0 80 >40  Poplt    11.5   2.4                Left Tibial Motor (Abd Hall Brev)  34.9C  Ankle    3.7 <6.0 3.6 >4 Knee Ankle 8.3 38.0 46 >40  Knee    12.0   2.1                Right Tibial Motor (Abd Hall Brev)  34.9C  Ankle    3.4 <6.0 2.0 >4 Knee Ankle 8.9 44.0 49 >40  Knee    12.3   0.8                  H Reflex Studies    NR H-Lat (ms) Lat Norm (ms) L-R H-Lat (ms)  Left Tibial (Gastroc)  34.9C     37.28 <35 0.00  Right Tibial (Gastroc)  34.9C     37.28 <35 0.00    EMG    Side Muscle Ins Act Fibs Psw Fasc Number Recrt Dur Dur. Amp Amp. Poly Poly.  Comment  Right AntTibialis Nml Nml Nml Nml 1- Rapid Some 1+ Some 1+ Some 1+ N/A  Right Gastroc Nml Nml Nml Nml 1- Rapid Some 1+ Some 1+ Some 1+ N/A  Right Flex Dig Long Nml Nml Nml Nml 1- Rapid Some 1+ Some 1+ Some 1+ N/A  Right RectFemoris Nml Nml Nml Nml Nml Nml Nml Nml Nml Nml Nml Nml N/A  Right GluteusMed Nml Nml Nml Nml Nml Nml Nml Nml Nml Nml Nml Nml N/A   Right BicepsFemS Nml Nml Nml Nml 1- Rapid Some 1+ Some 1+ Some 1+ N/A  Left AntTibialis Nml Nml Nml Nml 1- Rapid Some 1+ Some 1+ Some 1+ N/A  Left Gastroc Nml Nml Nml Nml 1- Rapid Some 1+ Some 1+ Some 1+ N/A  Left Flex Dig Long Nml Nml Nml Nml 1- Rapid Some 1+ Some 1+ Some 1+ N/A  Left RectFemoris Nml Nml Nml Nml Nml Nml Nml Nml Nml Nml Nml Nml N/A  Left GluteusMed Nml Nml Nml Nml Nml Nml Nml Nml Nml Nml Nml Nml N/A  Left BicepsFemS Nml Nml Nml Nml 1- Rapid Some 1+ Some 1+ Some 1+ N/A          Waveforms:                       Imaging  Imaging Information  Exam Information  Status Exam Begun  Exam Ended   Final [99]      Order-Level Documents:  There are no order-level documents. Encounter-Level Documents - 08/26/2017:  Electronic signature on 08/26/2017 10:08 AM - E-signed      NCV with EMG(electromyography) Order: [id number] Status:  Final result   Visible to patient:  No (seen, blocked) Dx:  Neuropathy; Unsteady gait; Sensory at... 1 Result Note   1 Follow-up Encounter Details  Narrative Glendale Chard, DO     08/26/2017 12:45 PM  [facility]  [address] [place], [address]   [place], [place] [address]  Tel: [phone]  Fax:  [phone]  Test Date:  08/26/2017   Patient: Pamela Gonzales DOB: Oct 25, 1930 Physician: Nita Sickle, DO  Sex: Female Height: 5\' 4"  Ref Phys: Nita Sickle, DO  ID#: [id number] Temp: 34.9C Technician:     Patient Complaints:  This is an 89 year old female referred for evaluation of  bilateral feet numbness and imbalance.   NCV & EMG Findings:  Extensive electrodiagnostic testing of the right lower extremity  and additional studies of the left shows:  1. Bilateral sural and superficial peroneal sensory responses are  absent.  2. Bilateral peroneal motor responses are within normal limits.  Bilateral tibial motor responses show reduced amplitude (R2.0,  L3.6 mV).    3. Bilateral tibial H reflex studies shows prolonged  latencies.  4. Chronic motor axon loss changes are seen affecting the muscles  below the knee and biceps femoris short head muscles bilaterally.   There is no evidence of active denervation.   Impression:  1. The electrophysiologic findings are most consistent with a  chronic and symmetric sensorimotor axonal polyneuropathy  affecting the lower extremities.  2. There is a superimposed chronic S1 radiculopathy affecting  bilateral lower extremities, mild in degree electrically.    ___________________________  Nita Sickle, DO     Nerve Conduction Studies  Anti Sensory Summary Table    Site NR Peak (ms) Norm Peak (ms) P-T Amp (V) Norm P-T Amp  Left Sup Peroneal Anti Sensory (Ant Lat Mall)  34.9C  12 cm  NR  <4.6  >3  Right Sup Peroneal Anti Sensory (Ant Lat Mall)  34.9C  12 cm NR  <4.6  >3  Left Sural Anti Sensory (Lat Mall)  34.9C  Calf NR  <4.6  >3  Right Sural Anti Sensory (Lat Mall)  34.9C  Calf NR  <4.6  >3   Motor Summary Table    Site NR Onset (ms) Norm Onset (ms) O-P Amp (mV) Norm O-P Amp  Site1 Site2 Delta-0 (ms) Dist (cm) Vel (m/s) Norm Vel (m/s)  Left Peroneal Motor (Ext Dig Brev)  34.9C  Ankle    3.5 <6.0 2.7 >2.5 B Fib Ankle 8.0 34.0 43 >40  B Fib    11.5  2.4  Poplt B Fib 1.3 8.0 62 >40  Poplt    12.8  2.1          Right Peroneal Motor (Ext Dig Brev)  34.9C  Ankle    3.4 <6.0 2.8 >2.5 B Fib Ankle 7.1 36.0 51 >40  B Fib    10.5  2.4  Poplt B Fib 1.0 8.0 80 >40  Poplt    11.5  2.4          Left Tibial Motor (Abd Hall Brev)  34.9C  Ankle    3.7 <6.0 3.6 >4 Knee Ankle 8.3 38.0 46 >40  Knee    12.0  2.1          Right Tibial Motor (Abd Hall Brev)  34.9C  Ankle    3.4 <6.0 2.0 >4 Knee Ankle 8.9 44.0 49 >40  Knee    12.3  0.8           H Reflex Studies    NR H-Lat (ms) Lat Norm (ms) L-R H-Lat (ms)  Left Tibial (Gastroc)  34.9C     37.28 <35 0.00  Right Tibial (Gastroc)  34.9C     37.28 <35 0.00   EMG    Side Muscle Ins Act Fibs Psw Fasc  Number Recrt Dur Dur. Amp Amp.  Poly Poly. Comment  Right AntTibialis Nml Nml Nml Nml 1- Rapid Some 1+ Some 1+ Some  1+ N/A  Right Gastroc Nml Nml Nml Nml 1- Rapid Some 1+ Some 1+ Some 1+  N/A  Right Flex Dig Long Nml Nml Nml Nml 1- Rapid Some 1+ Some 1+ Some  1+ N/A  Right RectFemoris Nml Nml Nml Nml Nml Nml Nml Nml Nml Nml Nml Nml  N/A  Right GluteusMed Nml Nml Nml Nml Nml Nml Nml Nml Nml Nml Nml Nml  N/A  Right BicepsFemS Nml Nml Nml Nml 1- Rapid Some 1+ Some 1+ Some 1+  N/A  Left AntTibialis Nml Nml Nml Nml 1- Rapid Some 1+ Some 1+ Some 1+  N/A  Left Gastroc Nml Nml Nml Nml 1- Rapid Some 1+ Some 1+ Some 1+ N/A   Left Flex Dig Long Nml Nml Nml Nml 1- Rapid Some 1+ Some 1+ Some  1+ N/A  Left RectFemoris Nml Nml Nml Nml Nml Nml Nml Nml Nml Nml Nml Nml  N/A  Left GluteusMed Nml Nml Nml Nml Nml Nml Nml Nml Nml Nml Nml Nml  N/A  Left BicepsFemS Nml Nml Nml Nml 1- Rapid Some 1+ Some 1+ Some 1+  N/A          Review of Systems: Patient complains of symptoms per HPI as well as the following symptoms: numbness, tingling, low back pain, remote falls. Pertinent negatives and positives per HPI. All others negative.  Social History   Socioeconomic History   Marital status: Divorced    Spouse name: Not on file   Number of children: 2   Years of education: 32   Highest education level: Master's degree (e.g., MA, MS, MEng, MEd, MSW, MBA)  Occupational History   Occupation: Nature conservation officer: RETIRED    Comment: retired  Tobacco Use   Smoking status: Never   Smokeless tobacco: Never  Vaping Use   Vaping Use: Never used  Substance and Sexual Activity   Alcohol use: Yes    Comment: very rare   Drug use: No   Sexual activity: Not Currently  Other Topics Concern   Not on file  Social History Narrative   Lives alone.  She has two 2 sons.  Retired.  Education: Scientist, water quality in Probation officer.   One story apartment   Right handed   Social  Determinants of Health   Financial Resource Strain: Not on file  Food Insecurity: Not on file  Transportation Needs: Not on file  Physical Activity: Not on file  Stress: Not on file  Social Connections: Not on file  Intimate Partner Violence: Not on file    Family History  Problem Relation Age of Onset   Heart attack Mother    Lung cancer Father    Cancer Sister    Pancreatic cancer Sister    Heart disease Sister    Heart attack Brother    Heart disease Brother    Rectal cancer Brother        only 1 kidney   Anesthesia problems Neg Hx    Hypotension Neg Hx    Malignant hyperthermia Neg Hx    Pseudochol deficiency Neg Hx    Breast cancer Neg Hx    Colon cancer Neg Hx    Stomach cancer Neg Hx    Neuropathy Neg Hx     Past Medical History:  Diagnosis Date   Anemia    Arthritis    Asthmatic bronchitis    Atherosclerotic heart disease of native coronary artery without angina pectoris    Benign fundic gland polyps of stomach    CAD (coronary artery disease) 05   stents   Daytime somnolence    Diverticulosis    Esophageal dysmotility    Fibula fracture    right   Gallstones    Gastric erosions    GERD (gastroesophageal reflux disease)    Hiatal hernia    Hypercholesteremia    Hypertension    Internal hemorrhoids    Joint stiffness    Lupus (HCC)    Osteoarthritis    Osteoporosis    Paresthesia    Peripheral neuropathy    Peripheral neuropathy    RBBB    Reactive airways dysfunction syndrome (HCC)    Sciatica    Seasonal allergies    Spinal stenosis    Tubular adenoma of colon     Patient Active Problem List   Diagnosis Date Noted   Iron deficiency 12/29/2020   Lumbar stenosis with neurogenic claudication 10/16/2019   Lumbosacral radiculopathy 10/16/2019   Numbness and tingling of foot 10/16/2019   Peripheral neuropathy 10/16/2019   Acute meniscal tear, medial, right, subsequent encounter 03/14/2019   Osteoporosis    Osteoarthritis    Internal  hemorrhoids    Hiatal hernia    Gastric erosions    Gallstones    Fibula fracture    Esophageal dysmotility    Diverticulosis    Benign  fundic gland polyps of stomach    Asthmatic bronchitis    Arthritis    Asthma 06/30/2014   Benign essential HTN 12/09/2013   RBBB    Cholelithiasis 05/28/2011   Obesity 11/15/2010   Elevated BP 05/01/2010   CAD (coronary artery disease)    Hypercholesteremia    Lupus (HCC)    GERD (gastroesophageal reflux disease)    Spinal stenosis     Past Surgical History:  Procedure Laterality Date   ABDOMINAL HYSTERECTOMY  1975   BREAST EXCISIONAL BIOPSY Right    1972 benign   CATARACT EXTRACTION, BILATERAL     CHOLECYSTECTOMY  07/04/2011   Procedure: LAPAROSCOPIC CHOLECYSTECTOMY WITH INTRAOPERATIVE CHOLANGIOGRAM;  Surgeon: Robyne Askew, MD;  Location: [facility] OR;  Service: General;  Laterality: N/A;   COLONOSCOPY W/ BIOPSIES     CORONARY ANGIOPLASTY  2005   ESOPHAGOGASTRODUODENOSCOPY (EGD) WITH PROPOFOL N/A 06/11/2016   Procedure: ESOPHAGOGASTRODUODENOSCOPY (EGD) WITH PROPOFOL;  Surgeon: Charolett Bumpers, MD;  Location: [facility];  Service: Endoscopy;  Laterality: N/A;   FIBULA FRACTURE SURGERY  2011   Right Ankle   FLEXIBLE SIGMOIDOSCOPY N/A 07/09/2016   Procedure: FLEXIBLE SIGMOIDOSCOPY;  Surgeon: Charolett Bumpers, MD;  Location: [facility];  Service: Endoscopy;  Laterality: N/A;   KNEE ARTHROSCOPY Right 03/15/2019   Procedure: Right knee arthroscopy; meniscal debridement chondroplasty;  Surgeon: Ollen Gross, MD;  Location: [facility];  Service: Orthopedics;  Laterality: Right;     Current Outpatient Medications  Medication Sig Dispense Refill   acetaminophen (TYLENOL) 500 MG tablet Take 500 mg by mouth 3 (three) times daily. Patient takes 4 times daily.     amLODipine (NORVASC) 5 MG tablet TAKE 1 TABLET BY MOUTH EVERY DAY 90 tablet 3   aspirin 81 MG tablet Take 81 mg by mouth daily.     Coenzyme Q10 (CO Q 10 PO) Take 200 mg by mouth  daily.     dextromethorphan-guaiFENesin (MUCINEX DM) 30-600 MG 12hr tablet Take 2 tablets by mouth daily.     losartan (COZAAR) 100 MG tablet TAKE 1 TABLET BY MOUTH EVERY DAY 90 tablet 3   Multiple Vitamins-Minerals (MULTIVITAMIN WITH MINERALS) tablet Take 1 tablet by mouth daily.     MYRBETRIQ 50 MG TB24 tablet Take 50 mg by mouth daily.   2   NASONEX 50 MCG/ACT nasal spray 2 sprays by Nasal route Daily.     pantoprazole (PROTONIX) 40 MG tablet TAKE 1 TABLET BY MOUTH EVERY DAY 90 tablet 1   rosuvastatin (CRESTOR) 40 MG tablet TAKE 1 TABLET BY MOUTH EVERY DAY 90 tablet 3   No current facility-administered medications for this visit.    Allergies as of 07/18/2022 - Review Complete 07/18/2022  Allergen Reaction Noted   Cephalexin Other (See Comments) 03/06/2010   Tramadol Other (See Comments) 10/11/2016   Lisinopril Cough 01/11/2015   Oxycodone Itching 12/14/2012   Penicillins Other (See Comments) 03/06/2010   Sulfa antibiotics Other (See Comments) and Rash 03/06/2010   Sulfa drugs cross reactors Other (See Comments) 03/06/2010    Vitals: BP (!) 123/53   Pulse 74   Ht 5\' 4"  (1.626 m)   Wt 194 lb (88 kg)   BMI 33.30 kg/m  Last Weight:  Wt Readings from Last 1 Encounters:  07/18/22 194 lb (88 kg)   Last Height:   Ht Readings from Last 1 Encounters:  07/18/22 5\' 4"  (1.626 m)   Physical exam: Exam: Gen: NAD, conversant, well nourised, obese, well groomed  CV: RRR, no MRG. No Carotid Bruits. No peripheral edema, warm, nontender Eyes: Conjunctivae clear without exudates or hemorrhage  Neuro: Detailed Neurologic Exam  Speech:    Speech is normal; fluent and spontaneous with normal comprehension.  Cognition:    The patient is oriented to person, place, and time;     recent and remote memory impaired;     language fluent;     impaired attention, concentration,   fund of knowledge Cranial Nerves:    The pupils are equal, round, and reactive to light.  Pupils too small to visualize fundi.  Visual fields are full to finger confrontation. Extraocular movements are intact. Trigeminal sensation is intact and the muscles of mastication are normal. The face is symmetric. The palate elevates in the midline. Hearing intact. Voice is normal. Shoulder shrug is normal. The tongue has normal motion without fasciculations.   Coordination: No ataxia or dysmetria  Gait:  Antalgic with a walker   Motor Observation:    No asymmetry, no atrophy, and no involuntary movements noted. Tone:    Normal muscle tone.    Posture:    Posture is slightly stooped    Strength:    Strength is V/V in the upper and lower limbs.      Sensation: decreased pin prick distally in feet     Reflex Exam:  DTR's:  Trace right AJ, absent left AJ   Toes:    The toes are equiv bilaterally.   Clonus:    Clonus is absent.      Assessment/Plan:  Absolutely lovely and humorous 89 y.o. female here as requested by Daisy Floro, MD for follow up.  She has a past medical history of coronary artery disease, hypertension, joint stiffness, arthritis, constipation, sinusitis, reactive airway dysfunction syndrome, Severe lumbar stenosis, sciatica, constipation, wheezing, distal peripheral neuropathy, balance problems, daytime somnolence. Patient has already been evaluated by Dr. Jeani Sow and has already had neurology evaluation and EMG nerve conduction study by Dr. Allena Katz at [facility] neurology who is a neuromuscular specialist and is currently managed at [facility].  Her evaluation showed L4-L5 severe multifactorial canal stenosis, bilateral radiculopathy with S1 radiculopathy confirmed on EMG nerve conduction study, and concomitant peripheral polyneuropathy stable.  Thyroid was normal, B12 is normal, in the past extensive lab testing ofr causes of peripheral neuropathy negative.   - return to pcp, no more for neurology to offer.   Patient is here for follow-up today, the  below assessment and plan is still current based on symptoms that she reports to me today.  In fact it appears she is stable or improved since I last saw her.  Her examination has not changed drastically and her symptoms are actually improved.   We reviewed her old symptoms, because compared to her new symptoms, we reviewed her old exam as compared to her new exam which is largely stable, I answered all questions and reviewed the below assessment and plan from last appointment which is still current for all her symptoms.  No new symptoms:  - Nothing more to add to patient's work-up, she has been already evaluated by NSY(Dr. Newell Coral), Neurology(Dr. Allena Katz) and was a patient at [facility] and now goes to [facility] ([place] for injections. The symptoms she is most symptomatic for are most likely from her severe canal stenosis and radiculopathy; she does have concomitant peripheral distal stable polyneuropathy(EMG/NCS completed by Dr. Allena Katz) but I do not believe this is significantly contributing to her main problems(see below).  At this  time neurology has no further recommendations but I am happy to follow her yearly for her symptoms. I was able to have a long conversation with patient and hopefully she has a better understanding of her particular clinical picture.   -Left foot numbness: Likely from low back issues; patient has severe lumbar stenosis as well as radiculopathy.  I spent at least 80 minutes of office time trying to explain the difference between spinal stenosis, lumbar radiculopathy, and peripheral polyneuropathy in relationship to her particular clinical presentation.  I do feel that the left foot numbness(right not affected, left foot numbness with slightly reduced AJ on the left) is coming from her lumbar stenosis and radiculopathy and I brought up images on the Internet of the lumbosacral dermatomes in order to show her how her spine and nerves innervate the foot. I tried to explain the difference between  peripheral polyneuropathy and radiculopathy as far as her clinical presentation goes.  Patient did understand what spinal stenosis and lumbar radiculopathy are but again we had a long conversation about how it applies to her particular clinical presentation.  -In addition to her severe lumbosacral issues, Patient has concomitant peripheral distal polyneuropathy at this time idiopathic but she was on plaqueneil for years and discussed with patient that plaquenil and autoimmune disorders, both can cause peripheral neuropathy.  I do not think that this is significant and may even be incidental, she does have some decreased pinprick distally in the feet in a gradient pattern (stable to improved) however her vibration is intact distally.  I do not think this is contributing to any balance issues (she denies having any falls in the last years) and the majority of her symptoms I do believe are due to her severe lumbar spinal stenosis and radiculopathy.  She has already had EMG nerve conduction study and evaluation of her peripheral polyneuropathy with Dr. Allena Katz, I do not feel as though I have more to add there, it is stable or improved and not progressive at this time and I do not think this is the crux of her problem and her symptoms are mostly from her low back and spinal stenosis with radiculopathy.  From last appointment: I answered a of questions to my best ability. Extended visit with patient. She had a written list: On exercise. On cramps and hamstring.  Again really tried to make a distinction between peripheral polyneuropathy and spinal stenosis and radiculopathy; her generalized peripheral polyneuropathyis stable, she asked me to predict what will happen with her low back issues and I can;t say what will happen with her stenosis but it can be progressive,  I can't predict what will happen but she looks amazing and appears to be much younger than stated age. Plaquenil had affected her retina nerve fiber and  can cause neuropathy but cannot say for certain, I explained to her it is very difficult to know what caused the peripheral polyneuropathy distally in her feet but it stable, not progressive, and the majority of her symptoms are coming from her spinal stenosis and radiculopathy.  I spent 30 minutes of face-to-face and non-face-to-face time with patient on the  1. Lumbar stenosis with neurogenic claudication   2. Other polyneuropathy   3. Lumbosacral radiculopathy     diagnosis.  This included previsit chart review, lab review, study review, order entry, electronic health record documentation, patient education on the different diagnostic and therapeutic options, counseling and coordination of care, risks and benefits of management, compliance, or risk factor reduction  Cc: Daisy Floro, MD,    Naomie Dean, MD  [facility] [address] [place], [place] [address]  Phone [phone] Fax [phone]

## 2022-07-18 NOTE — Patient Instructions (Addendum)
Spinal Cord Stimulation Trial Information A spinal cord stimulation trial is a test to see whether a spinal cord stimulator reduces your pain. A spinal cord stimulator is a small device that is inserted (implanted) in your back. The stimulator has small wires (leads) that connect it to your spinal cord. The stimulator sends electrical pulses through the leads to the spinal cord. This can relieve pain. Settings for the stimulator can be adjusted with a remote device to find the best pain control. Your health care provider may suggest a spinal cord stimulation trial if other treatments for long-lasting (chronic) pain have not worked for you. Spinal cord stimulation may be used to manage pain that is caused by: Failed back surgery. Heart pain (angina) or a blood vessel problem (peripheral vascular disease). Pain after amputation (phantom limb sensation). Nerve-related pain. Complex regional pain syndrome. Painful inflammation of a thin membrane that covers the brain and spinal cord (arachnoiditis). Other syndromes that involve chronic pain or injuries related to the spinal cord. For the trial, the stimulator is attached to your back instead of inserted under the skin. A trial period is usually 3-5 days, but this can vary among health care providers. After your trial period, you and your health care provider will discuss whether a permanent spinal cord stimulator is an option for you. The permanent stimulator may be an option depending on: Whether the stimulator reduces your pain during the trial. Whether the stimulator fits into your lifestyle. Whether the cost of the stimulator is covered by your insurance. What are the risks? Generally, a spinal cord stimulation trial is safe. However, problems can occur, including: Bleeding or pain at the insertion site of the leads. Infection at the insertion site or around the leads. Allergic reactions to medicines, devices, or dyes. Damage to the skin,  nerves, back muscles, or spinal cord where the leads are placed. Inability to move the legs (paralysis). Numbness in the legs. Inability to control when you urinate or have a bowel movement (incontinence). Spinal fluid leakage. How is a spinal cord stimulator placed for a trial?  For a trial period, the stimulator generator and battery will be outside the body, typically on a belt that you will wear around your waist. Only the leads that connect the stimulator to the spinal cord are implanted under your skin. The exact location of the stimulator depends on where you have pain. There are two types of surgery for implanting the leads: Noninvasive surgery. In this type of surgery, a small incision is made and needles are used to place the leads under your skin. Open surgery. In this type of surgery, a larger incision is made, and the leads are implanted directly into your back. How to care for yourself during the trial period Incision care Check your incisions every day for signs of infection. Check for: Redness, swelling, or pain. Fluid or blood. Warmth. Pus or a bad smell. Activity Return to your normal activities as told by your health care provider. Ask your health care provider what activities are safe for you. You may have to avoid lifting. Ask your health care provider how much you can safely lift. General instructions Follow your health care provider's instructions about how to take care of your spinal cord stimulator and your incision. Make sure you write down the following information so that you can share this information with your health care provider: Your responses to the stimulator. Describe these as told by your health care provider. Your pain  level throughout the day. The amount and kind of pain medicine that you take. Do not take baths, swim, or use a hot tub until your health care provider approves. Ask your health care provider if you may take showers. You may only be  allowed to take sponge baths. Take over-the-counter and prescription medicines only as told by your health care provider. Tell all health care providers who provide care for you that you have a spinal cord stimulator. This is important information that could affect the medical treatment that you receive. Keep all follow-up visits. This is important. Contact a health care provider if: You have any of these signs of infection: Redness, swelling, or pain around your incision. Fluid or blood coming from your incision. Warmth coming from your incision. Pus or a bad smell coming from your incision. A fever. Get help right away if: Your pain gets worse. The stimulator leads come out. You develop numbness or weakness in your legs, or you have difficulty walking. You have problems urinating or having a bowel movement. You have symptoms that last for more than 2-3 days or your symptoms suddenly get worse. These symptoms may be an emergency. Get help right away. Call 911. Do not wait to see if the symptoms will go away. Do not drive yourself to the hospital. Summary A spinal cord stimulator is a small device that sends electrical pulses to your spinal cord. This can relieve pain caused by many different health conditions. Before a permanent stimulator is placed, you will have a trial using a temporary stimulator that is not implanted under your skin. This helps determine if a stimulator will reduce your pain. For the trial, only the leads that connect the stimulator to the spinal cord are implanted under your skin. During the trial period, make sure you write down information about your pain and your responses to the stimulator so that you can share this information with your health care provider. Keep all follow-up visits. This is important. Contact your health care provider if you have problems during the trial. This information is not intended to replace advice given to you by your health care  provider. Make sure you discuss any questions you have with your health care provider. Document Revised: 10/17/2020 Document Reviewed: 10/17/2020 Elsevier Patient Education  2024 [employer].

## 2022-07-19 ENCOUNTER — Encounter: Payer: Self-pay | Admitting: Neurology

## 2022-07-19 DIAGNOSIS — M4800 Spinal stenosis, site unspecified: Secondary | ICD-10-CM | POA: Diagnosis not present

## 2022-07-19 DIAGNOSIS — M810 Age-related osteoporosis without current pathological fracture: Secondary | ICD-10-CM | POA: Diagnosis not present

## 2022-07-19 DIAGNOSIS — M1711 Unilateral primary osteoarthritis, right knee: Secondary | ICD-10-CM | POA: Diagnosis not present

## 2022-07-23 ENCOUNTER — Ambulatory Visit
Admission: RE | Admit: 2022-07-23 | Discharge: 2022-07-23 | Disposition: A | Discharge status: Home / Self Care | Payer: Medicare PPO | Source: Ambulatory Visit | Attending: Obstetrics and Gynecology | Admitting: Obstetrics and Gynecology

## 2022-07-23 DIAGNOSIS — M8580 Other specified disorders of bone density and structure, unspecified site: Secondary | ICD-10-CM

## 2022-07-23 DIAGNOSIS — E349 Endocrine disorder, unspecified: Secondary | ICD-10-CM | POA: Diagnosis not present

## 2022-07-23 DIAGNOSIS — N958 Other specified menopausal and perimenopausal disorders: Secondary | ICD-10-CM | POA: Diagnosis not present

## 2022-07-23 DIAGNOSIS — Z90722 Acquired absence of ovaries, bilateral: Secondary | ICD-10-CM | POA: Diagnosis not present

## 2022-07-24 ENCOUNTER — Ambulatory Visit: Payer: Medicare PPO | Admitting: Neurology

## 2022-07-29 DIAGNOSIS — Z6832 Body mass index (BMI) 32.0-32.9, adult: Secondary | ICD-10-CM | POA: Diagnosis not present

## 2022-07-29 DIAGNOSIS — Z01419 Encounter for gynecological examination (general) (routine) without abnormal findings: Secondary | ICD-10-CM | POA: Diagnosis not present

## 2022-08-07 ENCOUNTER — Ambulatory Visit: Payer: Medicare PPO | Admitting: Neurology

## 2022-08-14 DIAGNOSIS — J029 Acute pharyngitis, unspecified: Secondary | ICD-10-CM | POA: Diagnosis not present

## 2022-08-19 ENCOUNTER — Other Ambulatory Visit: Payer: Self-pay | Admitting: Cardiology

## 2022-08-20 ENCOUNTER — Other Ambulatory Visit: Payer: Self-pay | Admitting: Cardiology

## 2022-08-22 ENCOUNTER — Encounter: Payer: Self-pay | Admitting: Internal Medicine

## 2022-08-22 ENCOUNTER — Ambulatory Visit: Payer: Medicare PPO | Admitting: Internal Medicine

## 2022-08-22 VITALS — BP 140/78 | HR 85 | Ht 63.0 in | Wt 193.0 lb

## 2022-08-22 DIAGNOSIS — K449 Diaphragmatic hernia without obstruction or gangrene: Secondary | ICD-10-CM | POA: Diagnosis not present

## 2022-08-22 DIAGNOSIS — H6980 Other specified disorders of Eustachian tube, unspecified ear: Secondary | ICD-10-CM | POA: Diagnosis not present

## 2022-08-22 DIAGNOSIS — K5904 Chronic idiopathic constipation: Secondary | ICD-10-CM

## 2022-08-22 DIAGNOSIS — M6289 Other specified disorders of muscle: Secondary | ICD-10-CM

## 2022-08-22 DIAGNOSIS — Z6833 Body mass index (BMI) 33.0-33.9, adult: Secondary | ICD-10-CM | POA: Diagnosis not present

## 2022-08-22 DIAGNOSIS — J019 Acute sinusitis, unspecified: Secondary | ICD-10-CM | POA: Diagnosis not present

## 2022-08-22 DIAGNOSIS — Z862 Personal history of diseases of the blood and blood-forming organs and certain disorders involving the immune mechanism: Secondary | ICD-10-CM

## 2022-08-22 DIAGNOSIS — K219 Gastro-esophageal reflux disease without esophagitis: Secondary | ICD-10-CM

## 2022-08-22 NOTE — Patient Instructions (Addendum)
Continue pantoprazole and Miralax daily.  Please have your primary care physician draw a CBC and Ferritin.   _____________________________________________________  If your blood pressure at your visit was 140/90 or greater, please contact your primary care physician to follow up on this.  _______________________________________________________  If you are age 87 or older, your body mass index should be between 23-30. Your Body mass index is 34.19 kg/m. If this is out of the aforementioned range listed, please consider follow up with your Primary Care Provider.  If you are age 47 or younger, your body mass index should be between 19-25. Your Body mass index is 34.19 kg/m. If this is out of the aformentioned range listed, please consider follow up with your Primary Care Provider.   ________________________________________________________  The [facility] providers would like to encourage you to use [facility] to communicate with providers for non-urgent requests or questions.  Due to long hold times on the telephone, sending your provider a message by [facility] may be a faster and more efficient way to get a response.  Please allow 48 business hours for a response.  Please remember that this is for non-urgent requests.  _______________________________________________________ It was a pleasure to see you today!  Thank you for trusting me with your gastrointestinal care!

## 2022-08-22 NOTE — Progress Notes (Signed)
Subjective:    Patient ID: Pamela Gonzales, female    DOB: 05-23-1930, 89 y.o.   MRN: [id number]  HPI Lanice Folden is a 89 year old female with a history of GERD with complex hiatal hernia and Cameron's erosions, IDA felt secondary to hiatal hernia, prior gastritis, chronic constipation, colonic diverticulosis and remote colonic polyps who is here for follow-up.  She is here alone today and was last seen in March 2024.  She reports she has been doing very well particularly from a GI perspective.  She states that she would like to "sing the praises" of MiraLAX.  She is using this daily.  5 to 6 days a week she has a comfortable stool.  Occasionally she still does have to strain with defecation.  No blood in stool or melena.  For hemorrhoidal discomfort she will use RectiCare or generic 5% lidocaine.  High-fiber diet and regular apple intake also helps with her bowel movements.  She will use a second dose of MiraLAX on any day when she does not have a bowel movement.  Her reflux is well-controlled on once daily pantoprazole.  She has not had any nausea or vomiting.  No trouble swallowing.  No blood in stool or melena.  She has felt some minor fatigue over the last several weeks and will have blood work on 09/17/2022 with Dr. Tenny Craw, her primary care provider.  She will ask him to repeat blood counts and ferritin level.  She continues to remain active with water aerobics at least 3 days/week.  She does daily stretching in her office.  She is now using a walker to help with her mobility.   Review of Systems As per HPI, otherwise negative  Current Medications, Allergies, Past Medical History, Past Surgical History, Family History and Social History were reviewed in [employer] record.    Objective:   Physical Exam BP (!) 140/78   Pulse 85   Ht 5\' 3"  (1.6 m)   Wt 193 lb (87.5 kg)   BMI 34.19 kg/m  Gen: awake, alert, NAD HEENT: anicteric  Abd: soft, NT/ND, +BS  throughout Ext: no c/c/e Neuro: nonfocal     Latest Ref Rng & Units 02/08/2022    3:45 PM 04/02/2021    1:17 PM 12/28/2020   10:53 AM  CBC  WBC 4.0 - 10.5 K/uL 7.9  10.5  7.7   Hemoglobin 12.0 - 15.0 g/dL 40.9  81.1  91.4   Hematocrit 36.0 - 46.0 % 44.2  41.3  37.2   Platelets 150 - 400 K/uL 204  198.0  255.0          Assessment & Plan:  89 year old female with a history of GERD with complex hiatal hernia and Cameron's erosions, IDA felt secondary to hiatal hernia, prior gastritis, chronic constipation, colonic diverticulosis and remote colonic polyps who is here for follow-up.   Chronic constipation/pelvic floor dysfunction --doing well with MiraLAX.  She is using this mostly once a day occasionally twice daily. -- Continue MiraLAX 17 g daily -- Continue high-fiber diet  2.  GERD with hiatal hernia and history of Cameron's erosions --symptoms well-controlled on pantoprazole daily.  No recent melena and hemoglobin had remained normal -- Continue pantoprazole 40 mg a day  3.  IDA --felt secondary to hiatal hernia.  On daily PPI.  Dr. Tenny Craw will check CBC and ferritin next month.  4.  Hemorrhoidal irritation --controlled with as needed RectiCare  4 to 67-month follow-up, sooner if needed  30  minutes total spent today including patient facing time, coordination of care, reviewing medical history/procedures/pertinent radiology studies, and documentation of the encounter.

## 2022-08-29 DIAGNOSIS — R059 Cough, unspecified: Secondary | ICD-10-CM | POA: Diagnosis not present

## 2022-08-29 DIAGNOSIS — H9209 Otalgia, unspecified ear: Secondary | ICD-10-CM | POA: Diagnosis not present

## 2022-08-29 DIAGNOSIS — Z9989 Dependence on other enabling machines and devices: Secondary | ICD-10-CM | POA: Diagnosis not present

## 2022-08-29 DIAGNOSIS — Z6833 Body mass index (BMI) 33.0-33.9, adult: Secondary | ICD-10-CM | POA: Diagnosis not present

## 2022-09-17 DIAGNOSIS — D649 Anemia, unspecified: Secondary | ICD-10-CM | POA: Diagnosis not present

## 2022-09-17 DIAGNOSIS — M810 Age-related osteoporosis without current pathological fracture: Secondary | ICD-10-CM | POA: Diagnosis not present

## 2022-09-17 DIAGNOSIS — I10 Essential (primary) hypertension: Secondary | ICD-10-CM | POA: Diagnosis not present

## 2022-09-17 DIAGNOSIS — I2510 Atherosclerotic heart disease of native coronary artery without angina pectoris: Secondary | ICD-10-CM | POA: Diagnosis not present

## 2022-09-17 DIAGNOSIS — Z79899 Other long term (current) drug therapy: Secondary | ICD-10-CM | POA: Diagnosis not present

## 2022-09-17 LAB — LAB REPORT - SCANNED: EGFR: 78

## 2022-09-22 NOTE — Progress Notes (Unsigned)
Pamela Gonzales Date of Birth: 08/22/1930 Medical Record #[id number]  History of Present Illness: Pamela Gonzales is seen back today for follow up of CAD. She has a history of HLD, Lupus, GERD, OA, RBBB and CAD with past stents placed in 2005. She had stenting of the mid LAD and 2nd OM in 2005 with Cypher stents. Last Myoview was in June 2017 and was normal.   She is followed by Dr Rhea Belton for GERD, constipation and iron deficiency anemia felt to be partly due to [facility] lesions/hiatal hernia. Iron deficiency corrected with infusions. Last infusion in Dec.   She was seen in the ED in Jan with dizziness. Ecg unchanged. CT head negative. Treated with meclizine. She has had no recurrent dizziness.   On follow up today she is doing well from a cardiac standpoint.   She still  does water aerobics. She has progressive neuropathy in her feet that limits her activity.  She uses a walker. Has injections in her back periodically.  She denies any chest pain or SOB.   Current Outpatient Medications  Medication Sig Dispense Refill   acetaminophen (TYLENOL) 500 MG tablet Take 500 mg by mouth 3 (three) times daily. Patient takes 4 times daily.     amLODipine (NORVASC) 5 MG tablet TAKE 1 TABLET BY MOUTH EVERY DAY 90 tablet 2   aspirin 81 MG tablet Take 81 mg by mouth daily.     Coenzyme Q10 (CO Q 10 PO) Take 200 mg by mouth daily.     dextromethorphan-guaiFENesin (MUCINEX DM) 30-600 MG 12hr tablet Take 2 tablets by mouth daily.     losartan (COZAAR) 100 MG tablet TAKE 1 TABLET BY MOUTH EVERY DAY 90 tablet 3   Multiple Vitamins-Minerals (MULTIVITAMIN WITH MINERALS) tablet Take 1 tablet by mouth daily.     MYRBETRIQ 50 MG TB24 tablet Take 50 mg by mouth daily.   2   NASONEX 50 MCG/ACT nasal spray 2 sprays by Nasal route Daily.     pantoprazole (PROTONIX) 40 MG tablet TAKE 1 TABLET BY MOUTH EVERY DAY 90 tablet 1   rosuvastatin (CRESTOR) 40 MG tablet TAKE 1 TABLET BY MOUTH EVERY DAY 90 tablet 3   No current  facility-administered medications for this visit.    Allergies  Allergen Reactions   Cephalexin Other (See Comments)    "terrible abdominal pain"   Tramadol Other (See Comments)    Per pt "hallucinations"    Lisinopril Cough   Oxycodone Itching   Penicillins Other (See Comments)    "Pt reports "knots on my arms"   Sulfa Antibiotics Other (See Comments) and Rash    Skin irritation.   Sulfa Drugs Cross Reactors Other (See Comments)    Skin irritation.    Past Medical History:  Diagnosis Date   Anemia    Arthritis    Asthmatic bronchitis    Atherosclerotic heart disease of native coronary artery without angina pectoris    Benign fundic gland polyps of stomach    CAD (coronary artery disease) 05   stents   Daytime somnolence    Diverticulosis    Esophageal dysmotility    Fibula fracture    right   Gallstones    Gastric erosions    GERD (gastroesophageal reflux disease)    Hiatal hernia    Hypercholesteremia    Hypertension    Internal hemorrhoids    Joint stiffness    Lupus (HCC)    Osteoarthritis    Osteoporosis    Paresthesia  Peripheral neuropathy    Peripheral neuropathy    RBBB    Reactive airways dysfunction syndrome (HCC)    Sciatica    Seasonal allergies    Spinal stenosis    Tubular adenoma of colon     Past Surgical History:  Procedure Laterality Date   ABDOMINAL HYSTERECTOMY  1975   BREAST EXCISIONAL BIOPSY Right    1972 benign   CATARACT EXTRACTION, BILATERAL     CHOLECYSTECTOMY  07/04/2011   Procedure: LAPAROSCOPIC CHOLECYSTECTOMY WITH INTRAOPERATIVE CHOLANGIOGRAM;  Surgeon: Robyne Askew, MD;  Location: [facility] OR;  Service: General;  Laterality: N/A;   COLONOSCOPY W/ BIOPSIES     CORONARY ANGIOPLASTY  2005   ESOPHAGOGASTRODUODENOSCOPY (EGD) WITH PROPOFOL N/A 06/11/2016   Procedure: ESOPHAGOGASTRODUODENOSCOPY (EGD) WITH PROPOFOL;  Surgeon: Charolett Bumpers, MD;  Location: [facility] ENDOSCOPY;  Service: Endoscopy;  Laterality: N/A;   FIBULA  FRACTURE SURGERY  2011   Right Ankle   FLEXIBLE SIGMOIDOSCOPY N/A 07/09/2016   Procedure: FLEXIBLE SIGMOIDOSCOPY;  Surgeon: Charolett Bumpers, MD;  Location: [facility] ENDOSCOPY;  Service: Endoscopy;  Laterality: N/A;   KNEE ARTHROSCOPY Right 03/15/2019   Procedure: Right knee arthroscopy; meniscal debridement chondroplasty;  Surgeon: Ollen Gross, MD;  Location: [facility] ORS;  Service: Orthopedics;  Laterality: Right;     Social History   Tobacco Use  Smoking Status Never  Smokeless Tobacco Never    Social History   Substance and Sexual Activity  Alcohol Use Yes   Comment: very rare    Family History  Problem Relation Age of Onset   Heart attack Mother    Lung cancer Father    Cancer Sister    Pancreatic cancer Sister    Heart disease Sister    Heart attack Brother    Heart disease Brother    Rectal cancer Brother        only 1 kidney   Anesthesia problems Neg Hx    Hypotension Neg Hx    Malignant hyperthermia Neg Hx    Pseudochol deficiency Neg Hx    Breast cancer Neg Hx    Colon cancer Neg Hx    Stomach cancer Neg Hx    Neuropathy Neg Hx     Review of Systems: The review of systems is per the HPI.  All other systems were reviewed and are negative.  Physical Exam: There were no vitals taken for this visit. GENERAL:  Well appearing, obese WF in NAD HEENT:  PERRL, EOMI, sclera are clear. Oropharynx is clear. NECK:  No jugular venous distention, carotid upstroke brisk and symmetric, no bruits, no thyromegaly or adenopathy LUNGS:  Clear to auscultation bilaterally CHEST:  Unremarkable HEART:  RRR,  PMI not displaced or sustained,S1 and S2 within normal limits, no S3, no S4: no clicks, no rubs, no murmurs EXT:  2 + pulses throughout, no edema, no cyanosis no clubbing SKIN:  Warm and dry.  No rashes NEURO:  Alert and oriented x 3. Cranial nerves II through XII intact. PSYCH:  Cognitively intact  LABORATORY DATA:  Lab Results  Component Value Date   WBC 7.9  02/08/2022   HGB 15.0 02/08/2022   HCT 44.2 02/08/2022   PLT 204 02/08/2022   GLUCOSE 100 (H) 02/08/2022   CHOL 166 07/20/2019   TRIG 118 07/20/2019   HDL 62 07/20/2019   LDLCALC 83 07/20/2019   ALT 15 07/20/2019   AST 24 07/20/2019   NA 136 02/08/2022   K 3.7 02/08/2022   CL 101 02/08/2022  CREATININE 0.68 02/08/2022   BUN 16 02/08/2022   CO2 25 02/08/2022   INR 1.07 08/03/2009   HGBA1C 5.4 05/10/2010   Dated 08/17/20: cholesterol 155, triglycerides 123, HDL 55, LDL 78. CMET and CBC normal Dated 09/27/21: cholesterol 168, triglycerides 91, HDL 70, LDL 82. CMET normal.  Dated 09/17/22: cholesterol 157, triglycerides 116, HDL 58, LDL 78. CMET and CBC normal.   Ecg is not done today  Myoview study 07/26/15:Study Highlights   The left ventricular ejection fraction is hyperdynamic (>65%). Nuclear stress EF: 72%. There was no ST segment deviation noted during stress. The study is normal. This is a low risk study.   This is a normal pharmacologic nuclear study with no evidence of prior infarct or ischemia. Normal LVEF.        Assessment / Plan:  1. CAD - s/p stenting of the LAD and OM in 2005 with first generation Cypher stents. Normal Myoview in June 2017.  She is asymptomatic. We stopped Plavix due to easy bruisability. Continue ASA  2. HTN - BP is well controlled.   3. HLD - on statin. LDL 82.  Goal 70. On maximal dose of Crestor. Would like to avoid additional therapy at her age.   4. Chronic RBBB since 2014.    5. Peripheral neuropathy  6. Iron deficiency anemia. Improved. Hasn't required an iron infusion in 2 years.   I will follow up in 6 months

## 2022-09-25 ENCOUNTER — Ambulatory Visit: Payer: Medicare PPO | Attending: Cardiology | Admitting: Cardiology

## 2022-09-25 ENCOUNTER — Encounter: Payer: Self-pay | Admitting: Cardiology

## 2022-09-25 VITALS — BP 120/60 | HR 75 | Ht 63.0 in | Wt 194.0 lb

## 2022-09-25 DIAGNOSIS — I2510 Atherosclerotic heart disease of native coronary artery without angina pectoris: Secondary | ICD-10-CM | POA: Diagnosis not present

## 2022-09-25 DIAGNOSIS — I10 Essential (primary) hypertension: Secondary | ICD-10-CM | POA: Diagnosis not present

## 2022-09-25 DIAGNOSIS — E7800 Pure hypercholesterolemia, unspecified: Secondary | ICD-10-CM

## 2022-09-25 NOTE — Patient Instructions (Signed)
 Medication Instructions:  NO CHANGES *If you need a refill on your cardiac medications before your next appointment, please call your pharmacy*   Lab Work: NO LABS If you have labs (blood work) drawn today and your tests are completely normal, you will receive your results only by: MyChart Message (if you have MyChart) OR A paper copy in the mail If you have any lab test that is abnormal or we need to change your treatment, we will call you to review the results.   Testing/Procedures: NO TESTING   Follow-Up: At [facility], you and your health needs are our priority.  As part of our continuing mission to provide you with exceptional heart care, we have created designated Provider Care Teams.  These Care Teams include your primary Cardiologist (physician) and Advanced Practice Providers (APPs -  Physician Assistants and Nurse Practitioners) who all work together to provide you with the care you need, when you need it.    Your next appointment:   6 month(s)  Provider:   Peter [place], MD

## 2022-10-03 ENCOUNTER — Encounter: Payer: Self-pay | Admitting: Internal Medicine

## 2022-10-21 DIAGNOSIS — M4310 Spondylolisthesis, site unspecified: Secondary | ICD-10-CM | POA: Diagnosis not present

## 2022-10-21 DIAGNOSIS — D649 Anemia, unspecified: Secondary | ICD-10-CM | POA: Diagnosis not present

## 2022-10-21 DIAGNOSIS — I10 Essential (primary) hypertension: Secondary | ICD-10-CM | POA: Diagnosis not present

## 2022-10-21 DIAGNOSIS — M5416 Radiculopathy, lumbar region: Secondary | ICD-10-CM | POA: Diagnosis not present

## 2022-10-21 DIAGNOSIS — M810 Age-related osteoporosis without current pathological fracture: Secondary | ICD-10-CM | POA: Diagnosis not present

## 2022-10-21 DIAGNOSIS — Z0000 Encounter for general adult medical examination without abnormal findings: Secondary | ICD-10-CM | POA: Diagnosis not present

## 2022-10-21 DIAGNOSIS — M4800 Spinal stenosis, site unspecified: Secondary | ICD-10-CM | POA: Diagnosis not present

## 2022-10-21 DIAGNOSIS — Z6834 Body mass index (BMI) 34.0-34.9, adult: Secondary | ICD-10-CM | POA: Diagnosis not present

## 2022-10-21 DIAGNOSIS — K219 Gastro-esophageal reflux disease without esophagitis: Secondary | ICD-10-CM | POA: Diagnosis not present

## 2022-10-23 ENCOUNTER — Other Ambulatory Visit: Payer: Self-pay | Admitting: Family Medicine

## 2022-10-23 DIAGNOSIS — M5416 Radiculopathy, lumbar region: Secondary | ICD-10-CM

## 2022-10-30 ENCOUNTER — Other Ambulatory Visit: Payer: Medicare PPO

## 2022-10-30 DIAGNOSIS — M810 Age-related osteoporosis without current pathological fracture: Secondary | ICD-10-CM | POA: Diagnosis not present

## 2022-10-30 DIAGNOSIS — G629 Polyneuropathy, unspecified: Secondary | ICD-10-CM | POA: Diagnosis not present

## 2022-10-30 DIAGNOSIS — Z823 Family history of stroke: Secondary | ICD-10-CM | POA: Diagnosis not present

## 2022-10-30 DIAGNOSIS — E785 Hyperlipidemia, unspecified: Secondary | ICD-10-CM | POA: Diagnosis not present

## 2022-10-30 DIAGNOSIS — Z818 Family history of other mental and behavioral disorders: Secondary | ICD-10-CM | POA: Diagnosis not present

## 2022-10-30 DIAGNOSIS — K219 Gastro-esophageal reflux disease without esophagitis: Secondary | ICD-10-CM | POA: Diagnosis not present

## 2022-10-30 DIAGNOSIS — R2681 Unsteadiness on feet: Secondary | ICD-10-CM | POA: Diagnosis not present

## 2022-10-30 DIAGNOSIS — E669 Obesity, unspecified: Secondary | ICD-10-CM | POA: Diagnosis not present

## 2022-10-30 DIAGNOSIS — Z809 Family history of malignant neoplasm, unspecified: Secondary | ICD-10-CM | POA: Diagnosis not present

## 2022-10-30 DIAGNOSIS — Z880 Allergy status to penicillin: Secondary | ICD-10-CM | POA: Diagnosis not present

## 2022-10-30 DIAGNOSIS — I2510 Atherosclerotic heart disease of native coronary artery without angina pectoris: Secondary | ICD-10-CM | POA: Diagnosis not present

## 2022-10-30 DIAGNOSIS — Z882 Allergy status to sulfonamides status: Secondary | ICD-10-CM | POA: Diagnosis not present

## 2022-10-30 DIAGNOSIS — Z85828 Personal history of other malignant neoplasm of skin: Secondary | ICD-10-CM | POA: Diagnosis not present

## 2022-10-30 DIAGNOSIS — I10 Essential (primary) hypertension: Secondary | ICD-10-CM | POA: Diagnosis not present

## 2022-10-30 DIAGNOSIS — L309 Dermatitis, unspecified: Secondary | ICD-10-CM | POA: Diagnosis not present

## 2022-10-30 DIAGNOSIS — K5900 Constipation, unspecified: Secondary | ICD-10-CM | POA: Diagnosis not present

## 2022-10-30 DIAGNOSIS — F324 Major depressive disorder, single episode, in partial remission: Secondary | ICD-10-CM | POA: Diagnosis not present

## 2022-10-30 DIAGNOSIS — M1990 Unspecified osteoarthritis, unspecified site: Secondary | ICD-10-CM | POA: Diagnosis not present

## 2022-11-04 NOTE — Discharge Instructions (Addendum)

## 2022-11-05 ENCOUNTER — Inpatient Hospital Stay
Admission: RE | Admit: 2022-11-05 | Discharge: 2022-11-05 | Disposition: A | Discharge status: Home / Self Care | Payer: Medicare PPO | Source: Ambulatory Visit | Attending: Family Medicine | Admitting: Family Medicine

## 2022-11-27 DIAGNOSIS — M4800 Spinal stenosis, site unspecified: Secondary | ICD-10-CM | POA: Diagnosis not present

## 2022-11-27 DIAGNOSIS — Z9989 Dependence on other enabling machines and devices: Secondary | ICD-10-CM | POA: Diagnosis not present

## 2022-11-27 DIAGNOSIS — M4310 Spondylolisthesis, site unspecified: Secondary | ICD-10-CM | POA: Diagnosis not present

## 2022-11-27 DIAGNOSIS — M5416 Radiculopathy, lumbar region: Secondary | ICD-10-CM | POA: Diagnosis not present

## 2022-11-27 DIAGNOSIS — M5430 Sciatica, unspecified side: Secondary | ICD-10-CM | POA: Diagnosis not present

## 2022-11-27 DIAGNOSIS — Z6835 Body mass index (BMI) 35.0-35.9, adult: Secondary | ICD-10-CM | POA: Diagnosis not present

## 2022-11-27 DIAGNOSIS — Z23 Encounter for immunization: Secondary | ICD-10-CM | POA: Diagnosis not present

## 2022-11-28 DIAGNOSIS — M1711 Unilateral primary osteoarthritis, right knee: Secondary | ICD-10-CM | POA: Diagnosis not present

## 2022-12-05 DIAGNOSIS — M1711 Unilateral primary osteoarthritis, right knee: Secondary | ICD-10-CM | POA: Diagnosis not present

## 2022-12-11 DIAGNOSIS — M1711 Unilateral primary osteoarthritis, right knee: Secondary | ICD-10-CM | POA: Diagnosis not present

## 2022-12-20 DIAGNOSIS — M48062 Spinal stenosis, lumbar region with neurogenic claudication: Secondary | ICD-10-CM | POA: Diagnosis not present

## 2022-12-20 DIAGNOSIS — M4317 Spondylolisthesis, lumbosacral region: Secondary | ICD-10-CM | POA: Diagnosis not present

## 2022-12-20 DIAGNOSIS — M4316 Spondylolisthesis, lumbar region: Secondary | ICD-10-CM | POA: Diagnosis not present

## 2022-12-20 DIAGNOSIS — Z6832 Body mass index (BMI) 32.0-32.9, adult: Secondary | ICD-10-CM | POA: Diagnosis not present

## 2022-12-22 ENCOUNTER — Encounter: Payer: Self-pay | Admitting: Cardiology

## 2022-12-23 ENCOUNTER — Other Ambulatory Visit: Payer: Self-pay | Admitting: Cardiology

## 2023-01-07 ENCOUNTER — Encounter: Payer: Self-pay | Admitting: Internal Medicine

## 2023-01-07 ENCOUNTER — Ambulatory Visit: Payer: Medicare PPO | Admitting: Internal Medicine

## 2023-01-07 VITALS — BP 102/64 | Ht 63.0 in | Wt 196.0 lb

## 2023-01-07 DIAGNOSIS — K449 Diaphragmatic hernia without obstruction or gangrene: Secondary | ICD-10-CM | POA: Diagnosis not present

## 2023-01-07 DIAGNOSIS — D509 Iron deficiency anemia, unspecified: Secondary | ICD-10-CM | POA: Diagnosis not present

## 2023-01-07 DIAGNOSIS — Z862 Personal history of diseases of the blood and blood-forming organs and certain disorders involving the immune mechanism: Secondary | ICD-10-CM

## 2023-01-07 DIAGNOSIS — Z8719 Personal history of other diseases of the digestive system: Secondary | ICD-10-CM | POA: Diagnosis not present

## 2023-01-07 DIAGNOSIS — K219 Gastro-esophageal reflux disease without esophagitis: Secondary | ICD-10-CM

## 2023-01-07 DIAGNOSIS — K5904 Chronic idiopathic constipation: Secondary | ICD-10-CM

## 2023-01-07 DIAGNOSIS — M6289 Other specified disorders of muscle: Secondary | ICD-10-CM | POA: Diagnosis not present

## 2023-01-07 DIAGNOSIS — K5909 Other constipation: Secondary | ICD-10-CM

## 2023-01-07 NOTE — Progress Notes (Signed)
   Subjective:    Patient ID: Pamela Gonzales, female    DOB: [birth year], 89 y.o.   MRN: [id number]  HPI Pamela Gonzales is a 89 year old female with a history of GERD with complex hiatal hernia and Cameron's erosions, IDA felt secondary to hiatal hernia, prior gastritis, chronic constipation, colonic diverticulosis and remote colonic polyps who is here for follow-up. She is here alone today and was last seen in July 2024.  Two primary concerns today. Firstly, she reports incomplete bowel movements despite daily use of Miralax. She expresses reluctance to strain during bowel movements, leading to a sensation of incomplete evacuation. She has been taking a full dose of Miralax daily around 5 PM and is interested in increasing the dosage to improve bowel regularity.  Secondly, the patient has noticed that caffeine seems to exacerbate her GERD symptoms.  She has been managing his GERD with daily Pantoprazole and has made dietary modifications, such as avoiding spicy food at night and not eating before lying down.  She has also found relief from occasional indigestion with Tums. Despite her love for decaf coffee, she is aware that it still contains some caffeine and is mindful of its potential to irritate her stomach.  The patient also mentions peripheral neuropathy in her feet, which has impacted her mobility and independence to some extent. Despite this, she remains highly active, participating in water exercises three times a week and daily gym sessions for stretching. 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  Review of Systems As per HPI, otherwise negative  Current Medications, Allergies, Past Medical History, Past Surgical History, Family History and Social History were reviewed in [employer] record.    Objective:   Physical Exam There were no vitals taken for this visit. Gen: awake, alert, NAD HEENT:  anicteric  CV: RRR, no mrg Pulm: CTA b/l Abd: soft, NT/ND, +BS throughout Ext: no c/c/e Neuro: nonfocal      Assessment & Plan:   89 year old female with a history of GERD with complex hiatal hernia and Cameron's erosions, IDA felt secondary to hiatal hernia, prior gastritis, chronic constipation, colonic diverticulosis and remote colonic polyps who is here for follow-up.   Chronic Constipation/pelvic floor dysfunction Incomplete bowel movements despite daily Miralax use. Discussed increasing the dose to 1.5 caps daily in 10 ounces of water. -Increase Miralax to 1.5 caps daily in 10 ounces of water. -Continue high-fiber diet  Gastroesophageal Reflux Disease (GERD)/hiatal hernia with history of Cameron's erosions Controlled with daily Pantoprazole. No recent exacerbations. Discussed use of Tums for breakthrough symptoms. -Continue Pantoprazole 40 mg daily. -Use Tums as needed for breakthrough symptoms given it is effective for her.  Hemorrhoids No current issues. Using Recticare as needed. -Continue Recticare as needed.  IDA Felt secondary to hiatal hernia Monitor blood counts and iron studies over time   Follow-up in 4-6 months or sooner if any issues arise.  30 minutes total spent today including patient facing time, coordination of care, reviewing medical history/procedures/pertinent radiology studies, and documentation of the encounter.

## 2023-01-07 NOTE — Patient Instructions (Addendum)
Continue Pantoprazole.   Tums as needed.   Increase your Miralax to 1.5 capful in at least 10 ounces of water daily.   Follow-up 3-4 month. Office to contact you and schedule. If you have not heard from our office in 1-2 months, please call [phone] to schedule follow-up appointment.   _______________________________________________________  If your blood pressure at your visit was 140/90 or greater, please contact your primary care physician to follow up on this.  _______________________________________________________  If you are age 84 or older, your body mass index should be between 23-30. Your Body mass index is 34.72 kg/m. If this is out of the aforementioned range listed, please consider follow up with your Primary Care Provider.  If you are age 49 or younger, your body mass index should be between 19-25. Your Body mass index is 34.72 kg/m. If this is out of the aformentioned range listed, please consider follow up with your Primary Care Provider.   ________________________________________________________  The [facility] providers would like to encourage you to use [facility] to communicate with providers for non-urgent requests or questions.  Due to long hold times on the telephone, sending your provider a message by [facility] may be a faster and more efficient way to get a response.  Please allow 48 business hours for a response.  Please remember that this is for non-urgent requests.  _______________________________________________________  Due to recent changes in healthcare laws, you may see the results of your imaging and laboratory studies on MyChart before your provider has had a chance to review them.  We understand that in some cases there may be results that are confusing or concerning to you. Not all laboratory results come back in the same time frame and the provider may be waiting for multiple results in order to interpret others.  Please give [place] 48 hours in order for your  provider to thoroughly review all the results before contacting the office for clarification of your results.    Thank you for choosing me and [facility].  Dr.Jay Pyrtle

## 2023-02-06 DIAGNOSIS — Z961 Presence of intraocular lens: Secondary | ICD-10-CM | POA: Diagnosis not present

## 2023-02-12 ENCOUNTER — Other Ambulatory Visit: Payer: Self-pay | Admitting: Obstetrics and Gynecology

## 2023-02-12 DIAGNOSIS — Z1231 Encounter for screening mammogram for malignant neoplasm of breast: Secondary | ICD-10-CM

## 2023-02-23 DIAGNOSIS — K5900 Constipation, unspecified: Secondary | ICD-10-CM | POA: Diagnosis not present

## 2023-02-26 DIAGNOSIS — R399 Unspecified symptoms and signs involving the genitourinary system: Secondary | ICD-10-CM | POA: Diagnosis not present

## 2023-02-26 DIAGNOSIS — Z6834 Body mass index (BMI) 34.0-34.9, adult: Secondary | ICD-10-CM | POA: Diagnosis not present

## 2023-03-10 DIAGNOSIS — Z6835 Body mass index (BMI) 35.0-35.9, adult: Secondary | ICD-10-CM | POA: Diagnosis not present

## 2023-03-10 DIAGNOSIS — R399 Unspecified symptoms and signs involving the genitourinary system: Secondary | ICD-10-CM | POA: Diagnosis not present

## 2023-03-12 DIAGNOSIS — M533 Sacrococcygeal disorders, not elsewhere classified: Secondary | ICD-10-CM | POA: Diagnosis not present

## 2023-03-12 DIAGNOSIS — M4317 Spondylolisthesis, lumbosacral region: Secondary | ICD-10-CM | POA: Diagnosis not present

## 2023-03-12 DIAGNOSIS — M48062 Spinal stenosis, lumbar region with neurogenic claudication: Secondary | ICD-10-CM | POA: Diagnosis not present

## 2023-03-15 ENCOUNTER — Emergency Department (HOSPITAL_BASED_OUTPATIENT_CLINIC_OR_DEPARTMENT_OTHER): Payer: Medicare PPO

## 2023-03-15 ENCOUNTER — Emergency Department (HOSPITAL_BASED_OUTPATIENT_CLINIC_OR_DEPARTMENT_OTHER)
Admission: EM | Admit: 2023-03-15 | Discharge: 2023-03-15 | Disposition: A | Discharge status: Home / Self Care | Payer: Medicare PPO | Attending: Emergency Medicine | Admitting: Emergency Medicine

## 2023-03-15 ENCOUNTER — Encounter (HOSPITAL_BASED_OUTPATIENT_CLINIC_OR_DEPARTMENT_OTHER): Payer: Self-pay | Admitting: Emergency Medicine

## 2023-03-15 DIAGNOSIS — M545 Low back pain, unspecified: Secondary | ICD-10-CM | POA: Diagnosis not present

## 2023-03-15 DIAGNOSIS — M4317 Spondylolisthesis, lumbosacral region: Secondary | ICD-10-CM | POA: Diagnosis not present

## 2023-03-15 DIAGNOSIS — I959 Hypotension, unspecified: Secondary | ICD-10-CM | POA: Diagnosis not present

## 2023-03-15 DIAGNOSIS — M48061 Spinal stenosis, lumbar region without neurogenic claudication: Secondary | ICD-10-CM | POA: Diagnosis not present

## 2023-03-15 DIAGNOSIS — X58XXXA Exposure to other specified factors, initial encounter: Secondary | ICD-10-CM | POA: Insufficient documentation

## 2023-03-15 DIAGNOSIS — M5136 Other intervertebral disc degeneration, lumbar region with discogenic back pain only: Secondary | ICD-10-CM | POA: Diagnosis not present

## 2023-03-15 DIAGNOSIS — S3210XA Unspecified fracture of sacrum, initial encounter for closed fracture: Secondary | ICD-10-CM | POA: Diagnosis not present

## 2023-03-15 DIAGNOSIS — Z7982 Long term (current) use of aspirin: Secondary | ICD-10-CM | POA: Insufficient documentation

## 2023-03-15 DIAGNOSIS — S3992XA Unspecified injury of lower back, initial encounter: Secondary | ICD-10-CM | POA: Diagnosis present

## 2023-03-15 DIAGNOSIS — G8929 Other chronic pain: Secondary | ICD-10-CM | POA: Diagnosis not present

## 2023-03-15 DIAGNOSIS — M549 Dorsalgia, unspecified: Secondary | ICD-10-CM | POA: Diagnosis not present

## 2023-03-15 MED ORDER — HYDROCODONE-ACETAMINOPHEN 5-325 MG PO TABS: 2.0000 | ORAL_TABLET | Freq: Three times a day (TID) | ORAL | 0 refills | Status: DC | PRN

## 2023-03-15 MED ORDER — HYDROCODONE-ACETAMINOPHEN 5-325 MG PO TABS
1.0000 | ORAL_TABLET | Freq: Once | ORAL | Status: AC
  Administered 2023-03-15: 1 via ORAL
  Filled 2023-03-15: qty 1

## 2023-03-15 MED ORDER — NALOXONE HCL 4 MG/0.1ML NA LIQD: NASAL | 0 refills | Status: DC

## 2023-03-15 MED ORDER — KETOROLAC TROMETHAMINE 30 MG/ML IJ SOLN
30.0000 mg | Freq: Once | INTRAMUSCULAR | Status: AC
  Administered 2023-03-15: 30 mg via INTRAMUSCULAR
  Filled 2023-03-15: qty 1

## 2023-03-15 MED ORDER — PREDNISONE 20 MG PO TABS
20.0000 mg | ORAL_TABLET | Freq: Once | ORAL | Status: AC
  Administered 2023-03-15: 20 mg via ORAL
  Filled 2023-03-15: qty 1

## 2023-03-15 NOTE — ED Triage Notes (Signed)
Arrived vis [facility] with lower back pain since Wed. Denies urinary s/s. XR done Wed. Taking hydrocodone without relief.

## 2023-03-15 NOTE — ED Notes (Signed)
Pt assisted by staff x 2 to transport chair for discharge. Accompanied by son. Able to transfer into vehicle. Reviewed in detail pts discharge instructions and pt able to verbalize correct use of pain medication and use of narcan. Follow up reviewed and pt states understanding

## 2023-03-15 NOTE — Discharge Instructions (Addendum)
Please call make an appointment with your primary care physician and/or pain specialist for further management of sacral pain post hairline fracture and for recommendations for therapy.  Medications sent to pharmacy for pain control.  Please do not take the second dose of Norco without a family member nearby to keep close eye on you.  Due to this increased dose you are at increased risk for falls and/or respiratory depression.  A prescription for Narcan which is an opioid reversal medication has been sent to the pharmacy as well.  Please use prescription for any signs of respiratory depression, decreased respiratory rate, difficulty breathing, or lack of breathing.  Follow the manufactures guidelines on how to use.  The pharmacist can also help educate you when you pick up the prescription.
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
## 2023-03-17 DIAGNOSIS — N3281 Overactive bladder: Secondary | ICD-10-CM | POA: Diagnosis not present

## 2023-03-17 DIAGNOSIS — J329 Chronic sinusitis, unspecified: Secondary | ICD-10-CM | POA: Diagnosis not present

## 2023-03-17 DIAGNOSIS — M51369 Other intervertebral disc degeneration, lumbar region without mention of lumbar back pain or lower extremity pain: Secondary | ICD-10-CM | POA: Diagnosis not present

## 2023-03-17 DIAGNOSIS — Z9189 Other specified personal risk factors, not elsewhere classified: Secondary | ICD-10-CM | POA: Diagnosis not present

## 2023-03-17 DIAGNOSIS — K219 Gastro-esophageal reflux disease without esophagitis: Secondary | ICD-10-CM | POA: Diagnosis not present

## 2023-03-17 DIAGNOSIS — I2510 Atherosclerotic heart disease of native coronary artery without angina pectoris: Secondary | ICD-10-CM | POA: Diagnosis not present

## 2023-03-17 DIAGNOSIS — M6281 Muscle weakness (generalized): Secondary | ICD-10-CM | POA: Diagnosis not present

## 2023-03-17 DIAGNOSIS — R2689 Other abnormalities of gait and mobility: Secondary | ICD-10-CM | POA: Diagnosis not present

## 2023-03-17 DIAGNOSIS — S3210XD Unspecified fracture of sacrum, subsequent encounter for fracture with routine healing: Secondary | ICD-10-CM | POA: Diagnosis not present

## 2023-03-17 DIAGNOSIS — F1120 Opioid dependence, uncomplicated: Secondary | ICD-10-CM | POA: Diagnosis not present

## 2023-03-17 DIAGNOSIS — R262 Difficulty in walking, not elsewhere classified: Secondary | ICD-10-CM | POA: Diagnosis not present

## 2023-03-18 DIAGNOSIS — M6281 Muscle weakness (generalized): Secondary | ICD-10-CM | POA: Diagnosis not present

## 2023-03-18 DIAGNOSIS — R2689 Other abnormalities of gait and mobility: Secondary | ICD-10-CM | POA: Diagnosis not present

## 2023-03-19 DIAGNOSIS — M6281 Muscle weakness (generalized): Secondary | ICD-10-CM | POA: Diagnosis not present

## 2023-03-19 DIAGNOSIS — R2689 Other abnormalities of gait and mobility: Secondary | ICD-10-CM | POA: Diagnosis not present

## 2023-03-20 ENCOUNTER — Encounter: Payer: Self-pay | Admitting: Cardiology

## 2023-03-20 DIAGNOSIS — R2689 Other abnormalities of gait and mobility: Secondary | ICD-10-CM | POA: Diagnosis not present

## 2023-03-20 DIAGNOSIS — M6281 Muscle weakness (generalized): Secondary | ICD-10-CM | POA: Diagnosis not present

## 2023-03-21 DIAGNOSIS — R2689 Other abnormalities of gait and mobility: Secondary | ICD-10-CM | POA: Diagnosis not present

## 2023-03-21 DIAGNOSIS — M6281 Muscle weakness (generalized): Secondary | ICD-10-CM | POA: Diagnosis not present

## 2023-03-22 NOTE — Progress Notes (Deleted)
 Pamela Gonzales Date of Birth: 06-Dec-1930 Medical Record #[id number]  History of Present Illness: Pamela Gonzales is seen back today for follow up of CAD. She has a history of HLD, Lupus, GERD, OA, RBBB and CAD with past stents placed in 2005. She had stenting of the mid LAD and 2nd OM in 2005 with Cypher stents. Last Myoview was in June 2017 and was normal.   She is followed by Dr Rhea Belton for GERD, constipation and iron deficiency anemia felt to be partly due to [facility] lesions/hiatal hernia. Iron deficiency corrected with infusions. Last infusion in Dec.   She was seen in the ED in Jan with dizziness. Ecg unchanged. CT head negative. Treated with meclizine. She has had no recurrent dizziness.   On follow up today she is doing well from a cardiac standpoint.   She still  does water aerobics. She has progressive neuropathy in her feet that limits her activity.  She uses a walker.  She denies any chest pain or SOB. She did have a URI 2 months ago but this has resolved.   Current Outpatient Medications  Medication Sig Dispense Refill   acetaminophen (TYLENOL) 500 MG tablet Take 500 mg by mouth 3 (three) times daily. Patient takes 4 times daily.     amLODipine (NORVASC) 5 MG tablet TAKE 1 TABLET BY MOUTH EVERY DAY 90 tablet 2   aspirin 81 MG tablet Take 81 mg by mouth daily.     calcium carbonate (OSCAL) 1500 (600 Ca) MG TABS tablet Take 600 mg of elemental calcium by mouth daily.     Coenzyme Q10 (CO Q 10 PO) Take 200 mg by mouth daily.     dextromethorphan-guaiFENesin (MUCINEX DM) 30-600 MG 12hr tablet Take 2 tablets by mouth daily.     HYDROcodone-acetaminophen (NORCO/VICODIN) 5-325 MG tablet Take 2 tablets by mouth every 8 (eight) hours as needed for up to 6 doses for severe pain (pain score 7-10). 6 tablet 0   losartan (COZAAR) 100 MG tablet TAKE 1 TABLET BY MOUTH EVERY DAY 90 tablet 3   Multiple Vitamins-Minerals (MULTIVITAMIN WITH MINERALS) tablet Take 1 tablet by mouth daily.     MYRBETRIQ 50  MG TB24 tablet Take 50 mg by mouth daily.   2   naloxone (NARCAN) nasal spray 4 mg/0.1 mL Please follow per manufacturer recommendations signs of respiratory depression, respiratory distress, or lack of breathing. 1 each 0   NASONEX 50 MCG/ACT nasal spray 2 sprays by Nasal route Daily.     pantoprazole (PROTONIX) 40 MG tablet TAKE 1 TABLET BY MOUTH EVERY DAY 90 tablet 1   rosuvastatin (CRESTOR) 40 MG tablet TAKE 1 TABLET BY MOUTH EVERY DAY 90 tablet 2   No current facility-administered medications for this visit.    Allergies  Allergen Reactions   Cephalexin Other (See Comments)    "terrible abdominal pain"   Tramadol Other (See Comments)    Per pt "hallucinations"    Lisinopril Cough   Oxycodone Itching   Penicillins Other (See Comments)    "Pt reports "knots on my arms"   Sulfa Antibiotics Other (See Comments) and Rash    Skin irritation.   Sulfa Drugs Cross Reactors Other (See Comments)    Skin irritation.    Past Medical History:  Diagnosis Date   Anemia    Arthritis    Asthmatic bronchitis    Atherosclerotic heart disease of native coronary artery without angina pectoris    Benign fundic gland polyps of stomach  CAD (coronary artery disease) 05   stents   Daytime somnolence    Diverticulosis    Esophageal dysmotility    Fibula fracture    right   Gallstones    Gastric erosions    GERD (gastroesophageal reflux disease)    Hiatal hernia    Hypercholesteremia    Hypertension    Internal hemorrhoids    Joint stiffness    Lupus    Osteoarthritis    Osteoporosis    Paresthesia    Peripheral neuropathy    Peripheral neuropathy    RBBB    Reactive airways dysfunction syndrome (HCC)    Sciatica    Seasonal allergies    Spinal stenosis    Tubular adenoma of colon     Past Surgical History:  Procedure Laterality Date   ABDOMINAL HYSTERECTOMY  1975   BREAST EXCISIONAL BIOPSY Right    1972 benign   CATARACT EXTRACTION, BILATERAL     CHOLECYSTECTOMY   07/04/2011   Procedure: LAPAROSCOPIC CHOLECYSTECTOMY WITH INTRAOPERATIVE CHOLANGIOGRAM;  Surgeon: Robyne Askew, MD;  Location: [facility] OR;  Service: General;  Laterality: N/A;   COLONOSCOPY W/ BIOPSIES     CORONARY ANGIOPLASTY  2005   ESOPHAGOGASTRODUODENOSCOPY (EGD) WITH PROPOFOL N/A 06/11/2016   Procedure: ESOPHAGOGASTRODUODENOSCOPY (EGD) WITH PROPOFOL;  Surgeon: Charolett Bumpers, MD;  Location: [facility] ENDOSCOPY;  Service: Endoscopy;  Laterality: N/A;   FIBULA FRACTURE SURGERY  2011   Right Ankle   FLEXIBLE SIGMOIDOSCOPY N/A 07/09/2016   Procedure: FLEXIBLE SIGMOIDOSCOPY;  Surgeon: Charolett Bumpers, MD;  Location: [facility] ENDOSCOPY;  Service: Endoscopy;  Laterality: N/A;   KNEE ARTHROSCOPY Right 03/15/2019   Procedure: Right knee arthroscopy; meniscal debridement chondroplasty;  Surgeon: Ollen Gross, MD;  Location: [facility] ORS;  Service: Orthopedics;  Laterality: Right;     Social History   Tobacco Use  Smoking Status Never  Smokeless Tobacco Never    Social History   Substance and Sexual Activity  Alcohol Use Yes   Comment: very rare    Family History  Problem Relation Age of Onset   Heart attack Mother    Lung cancer Father    Cancer Sister    Pancreatic cancer Sister    Heart disease Sister    Heart attack Brother    Heart disease Brother    Rectal cancer Brother        only 1 kidney   Anesthesia problems Neg Hx    Hypotension Neg Hx    Malignant hyperthermia Neg Hx    Pseudochol deficiency Neg Hx    Breast cancer Neg Hx    Colon cancer Neg Hx    Stomach cancer Neg Hx    Neuropathy Neg Hx     Review of Systems: The review of systems is per the HPI.  All other systems were reviewed and are negative.  Physical Exam: There were no vitals taken for this visit. GENERAL:  Well appearing, obese WF in NAD HEENT:  PERRL, EOMI, sclera are clear. Oropharynx is clear. NECK:  No jugular venous distention, carotid upstroke brisk and symmetric, no bruits, no thyromegaly or  adenopathy LUNGS:  Clear to auscultation bilaterally CHEST:  Unremarkable HEART:  RRR,  PMI not displaced or sustained,S1 and S2 within normal limits, no S3, no S4: no clicks, no rubs, no murmurs EXT:  2 + pulses throughout, no edema, no cyanosis no clubbing SKIN:  Warm and dry.  No rashes NEURO:  Alert and oriented x 3. Cranial nerves II through XII  intact. PSYCH:  Cognitively intact  LABORATORY DATA:  Lab Results  Component Value Date   WBC 7.9 02/08/2022   HGB 15.0 02/08/2022   HCT 44.2 02/08/2022   PLT 204 02/08/2022   GLUCOSE 100 (H) 02/08/2022   CHOL 166 07/20/2019   TRIG 118 07/20/2019   HDL 62 07/20/2019   LDLCALC 83 07/20/2019   ALT 15 07/20/2019   AST 24 07/20/2019   NA 136 02/08/2022   K 3.7 02/08/2022   CL 101 02/08/2022   CREATININE 0.68 02/08/2022   BUN 16 02/08/2022   CO2 25 02/08/2022   INR 1.07 08/03/2009   HGBA1C 5.4 05/10/2010   Dated 08/17/20: cholesterol 155, triglycerides 123, HDL 55, LDL 78. CMET and CBC normal Dated 09/27/21: cholesterol 168, triglycerides 91, HDL 70, LDL 82. CMET normal.  Dated 09/17/22: cholesterol 157, triglycerides 116, HDL 58, LDL 78. CMET and CBC normal.   Ecg is not done today  Myoview study 07/26/15:Study Highlights   The left ventricular ejection fraction is hyperdynamic (>65%). Nuclear stress EF: 72%. There was no ST segment deviation noted during stress. The study is normal. This is a low risk study.   This is a normal pharmacologic nuclear study with no evidence of prior infarct or ischemia. Normal LVEF.        Assessment / Plan:  1. CAD - s/p stenting of the LAD and OM in 2005 with first generation Cypher stents. Normal Myoview in June 2017.  She is asymptomatic. We stopped Plavix due to easy bruisability. Continue ASA  2. HTN - BP is well controlled.   3. HLD - on statin. LDL 78.  Goal 70. On maximal dose of Crestor. She would like to avoid additional therapy at her age.   4. Chronic RBBB since 2014.     5. Peripheral neuropathy   I will follow up in 6 months

## 2023-03-24 DIAGNOSIS — M6281 Muscle weakness (generalized): Secondary | ICD-10-CM | POA: Diagnosis not present

## 2023-03-24 DIAGNOSIS — R5381 Other malaise: Secondary | ICD-10-CM | POA: Diagnosis not present

## 2023-03-24 DIAGNOSIS — M51369 Other intervertebral disc degeneration, lumbar region without mention of lumbar back pain or lower extremity pain: Secondary | ICD-10-CM | POA: Diagnosis not present

## 2023-03-24 DIAGNOSIS — F1120 Opioid dependence, uncomplicated: Secondary | ICD-10-CM | POA: Diagnosis not present

## 2023-03-24 DIAGNOSIS — S3210XD Unspecified fracture of sacrum, subsequent encounter for fracture with routine healing: Secondary | ICD-10-CM | POA: Diagnosis not present

## 2023-03-24 DIAGNOSIS — R2689 Other abnormalities of gait and mobility: Secondary | ICD-10-CM | POA: Diagnosis not present

## 2023-03-24 DIAGNOSIS — M810 Age-related osteoporosis without current pathological fracture: Secondary | ICD-10-CM | POA: Diagnosis not present

## 2023-03-25 DIAGNOSIS — R2689 Other abnormalities of gait and mobility: Secondary | ICD-10-CM | POA: Diagnosis not present

## 2023-03-25 DIAGNOSIS — M6281 Muscle weakness (generalized): Secondary | ICD-10-CM | POA: Diagnosis not present

## 2023-03-26 ENCOUNTER — Ambulatory Visit: Payer: Medicare PPO | Admitting: Cardiology

## 2023-03-26 DIAGNOSIS — R2689 Other abnormalities of gait and mobility: Secondary | ICD-10-CM | POA: Diagnosis not present

## 2023-03-26 DIAGNOSIS — M6281 Muscle weakness (generalized): Secondary | ICD-10-CM | POA: Diagnosis not present

## 2023-03-27 DIAGNOSIS — R2689 Other abnormalities of gait and mobility: Secondary | ICD-10-CM | POA: Diagnosis not present

## 2023-03-27 DIAGNOSIS — M6281 Muscle weakness (generalized): Secondary | ICD-10-CM | POA: Diagnosis not present

## 2023-03-28 ENCOUNTER — Ambulatory Visit: Payer: Medicare PPO | Admitting: Cardiology

## 2023-03-28 DIAGNOSIS — M6281 Muscle weakness (generalized): Secondary | ICD-10-CM | POA: Diagnosis not present

## 2023-03-28 DIAGNOSIS — R2689 Other abnormalities of gait and mobility: Secondary | ICD-10-CM | POA: Diagnosis not present

## 2023-03-31 DIAGNOSIS — M6281 Muscle weakness (generalized): Secondary | ICD-10-CM | POA: Diagnosis not present

## 2023-03-31 DIAGNOSIS — S3210XD Unspecified fracture of sacrum, subsequent encounter for fracture with routine healing: Secondary | ICD-10-CM | POA: Diagnosis not present

## 2023-03-31 DIAGNOSIS — R2689 Other abnormalities of gait and mobility: Secondary | ICD-10-CM | POA: Diagnosis not present

## 2023-04-01 ENCOUNTER — Ambulatory Visit: Payer: Medicare PPO

## 2023-04-01 DIAGNOSIS — R2689 Other abnormalities of gait and mobility: Secondary | ICD-10-CM | POA: Diagnosis not present

## 2023-04-01 DIAGNOSIS — S3210XD Unspecified fracture of sacrum, subsequent encounter for fracture with routine healing: Secondary | ICD-10-CM | POA: Diagnosis not present

## 2023-04-01 DIAGNOSIS — M6281 Muscle weakness (generalized): Secondary | ICD-10-CM | POA: Diagnosis not present

## 2023-04-02 DIAGNOSIS — S3210XD Unspecified fracture of sacrum, subsequent encounter for fracture with routine healing: Secondary | ICD-10-CM | POA: Diagnosis not present

## 2023-04-02 DIAGNOSIS — R2689 Other abnormalities of gait and mobility: Secondary | ICD-10-CM | POA: Diagnosis not present

## 2023-04-02 DIAGNOSIS — M6281 Muscle weakness (generalized): Secondary | ICD-10-CM | POA: Diagnosis not present

## 2023-04-03 DIAGNOSIS — R2689 Other abnormalities of gait and mobility: Secondary | ICD-10-CM | POA: Diagnosis not present

## 2023-04-03 DIAGNOSIS — M6281 Muscle weakness (generalized): Secondary | ICD-10-CM | POA: Diagnosis not present

## 2023-04-03 DIAGNOSIS — S3210XD Unspecified fracture of sacrum, subsequent encounter for fracture with routine healing: Secondary | ICD-10-CM | POA: Diagnosis not present

## 2023-04-04 DIAGNOSIS — M6281 Muscle weakness (generalized): Secondary | ICD-10-CM | POA: Diagnosis not present

## 2023-04-04 DIAGNOSIS — R2689 Other abnormalities of gait and mobility: Secondary | ICD-10-CM | POA: Diagnosis not present

## 2023-04-04 DIAGNOSIS — S3210XD Unspecified fracture of sacrum, subsequent encounter for fracture with routine healing: Secondary | ICD-10-CM | POA: Diagnosis not present

## 2023-04-07 DIAGNOSIS — R2689 Other abnormalities of gait and mobility: Secondary | ICD-10-CM | POA: Diagnosis not present

## 2023-04-07 DIAGNOSIS — S3210XD Unspecified fracture of sacrum, subsequent encounter for fracture with routine healing: Secondary | ICD-10-CM | POA: Diagnosis not present

## 2023-04-07 DIAGNOSIS — M6281 Muscle weakness (generalized): Secondary | ICD-10-CM | POA: Diagnosis not present

## 2023-04-08 DIAGNOSIS — S3210XD Unspecified fracture of sacrum, subsequent encounter for fracture with routine healing: Secondary | ICD-10-CM | POA: Diagnosis not present

## 2023-04-08 DIAGNOSIS — R2689 Other abnormalities of gait and mobility: Secondary | ICD-10-CM | POA: Diagnosis not present

## 2023-04-08 DIAGNOSIS — M6281 Muscle weakness (generalized): Secondary | ICD-10-CM | POA: Diagnosis not present

## 2023-04-09 DIAGNOSIS — M533 Sacrococcygeal disorders, not elsewhere classified: Secondary | ICD-10-CM | POA: Diagnosis not present

## 2023-04-09 DIAGNOSIS — M48062 Spinal stenosis, lumbar region with neurogenic claudication: Secondary | ICD-10-CM | POA: Diagnosis not present

## 2023-04-09 DIAGNOSIS — S3210XA Unspecified fracture of sacrum, initial encounter for closed fracture: Secondary | ICD-10-CM | POA: Diagnosis not present

## 2023-04-09 DIAGNOSIS — M5416 Radiculopathy, lumbar region: Secondary | ICD-10-CM | POA: Diagnosis not present

## 2023-04-09 DIAGNOSIS — R2681 Unsteadiness on feet: Secondary | ICD-10-CM | POA: Diagnosis not present

## 2023-04-14 DIAGNOSIS — R2689 Other abnormalities of gait and mobility: Secondary | ICD-10-CM | POA: Diagnosis not present

## 2023-04-14 DIAGNOSIS — S3210XD Unspecified fracture of sacrum, subsequent encounter for fracture with routine healing: Secondary | ICD-10-CM | POA: Diagnosis not present

## 2023-04-14 DIAGNOSIS — M545 Low back pain, unspecified: Secondary | ICD-10-CM | POA: Diagnosis not present

## 2023-04-14 DIAGNOSIS — F1120 Opioid dependence, uncomplicated: Secondary | ICD-10-CM | POA: Diagnosis not present

## 2023-04-14 DIAGNOSIS — M6281 Muscle weakness (generalized): Secondary | ICD-10-CM | POA: Diagnosis not present

## 2023-04-16 DIAGNOSIS — M6281 Muscle weakness (generalized): Secondary | ICD-10-CM | POA: Diagnosis not present

## 2023-04-16 DIAGNOSIS — S3210XD Unspecified fracture of sacrum, subsequent encounter for fracture with routine healing: Secondary | ICD-10-CM | POA: Diagnosis not present

## 2023-04-16 DIAGNOSIS — F1120 Opioid dependence, uncomplicated: Secondary | ICD-10-CM | POA: Diagnosis not present

## 2023-04-16 DIAGNOSIS — R2689 Other abnormalities of gait and mobility: Secondary | ICD-10-CM | POA: Diagnosis not present

## 2023-04-16 DIAGNOSIS — M545 Low back pain, unspecified: Secondary | ICD-10-CM | POA: Diagnosis not present

## 2023-04-18 DIAGNOSIS — I10 Essential (primary) hypertension: Secondary | ICD-10-CM | POA: Diagnosis not present

## 2023-04-18 DIAGNOSIS — M549 Dorsalgia, unspecified: Secondary | ICD-10-CM | POA: Diagnosis not present

## 2023-04-18 DIAGNOSIS — Z09 Encounter for follow-up examination after completed treatment for conditions other than malignant neoplasm: Secondary | ICD-10-CM | POA: Diagnosis not present

## 2023-04-18 DIAGNOSIS — N390 Urinary tract infection, site not specified: Secondary | ICD-10-CM | POA: Diagnosis not present

## 2023-04-18 DIAGNOSIS — Z6834 Body mass index (BMI) 34.0-34.9, adult: Secondary | ICD-10-CM | POA: Diagnosis not present

## 2023-04-18 DIAGNOSIS — R350 Frequency of micturition: Secondary | ICD-10-CM | POA: Diagnosis not present

## 2023-04-21 DIAGNOSIS — M6281 Muscle weakness (generalized): Secondary | ICD-10-CM | POA: Diagnosis not present

## 2023-04-21 DIAGNOSIS — F1120 Opioid dependence, uncomplicated: Secondary | ICD-10-CM | POA: Diagnosis not present

## 2023-04-21 DIAGNOSIS — M545 Low back pain, unspecified: Secondary | ICD-10-CM | POA: Diagnosis not present

## 2023-04-21 DIAGNOSIS — S3210XD Unspecified fracture of sacrum, subsequent encounter for fracture with routine healing: Secondary | ICD-10-CM | POA: Diagnosis not present

## 2023-04-21 DIAGNOSIS — R2689 Other abnormalities of gait and mobility: Secondary | ICD-10-CM | POA: Diagnosis not present

## 2023-04-23 DIAGNOSIS — R2689 Other abnormalities of gait and mobility: Secondary | ICD-10-CM | POA: Diagnosis not present

## 2023-04-23 DIAGNOSIS — F1120 Opioid dependence, uncomplicated: Secondary | ICD-10-CM | POA: Diagnosis not present

## 2023-04-23 DIAGNOSIS — M6281 Muscle weakness (generalized): Secondary | ICD-10-CM | POA: Diagnosis not present

## 2023-04-23 DIAGNOSIS — M545 Low back pain, unspecified: Secondary | ICD-10-CM | POA: Diagnosis not present

## 2023-04-23 DIAGNOSIS — S3210XD Unspecified fracture of sacrum, subsequent encounter for fracture with routine healing: Secondary | ICD-10-CM | POA: Diagnosis not present

## 2023-04-25 DIAGNOSIS — F1120 Opioid dependence, uncomplicated: Secondary | ICD-10-CM | POA: Diagnosis not present

## 2023-04-25 DIAGNOSIS — R2689 Other abnormalities of gait and mobility: Secondary | ICD-10-CM | POA: Diagnosis not present

## 2023-04-25 DIAGNOSIS — S3210XD Unspecified fracture of sacrum, subsequent encounter for fracture with routine healing: Secondary | ICD-10-CM | POA: Diagnosis not present

## 2023-04-25 DIAGNOSIS — M6281 Muscle weakness (generalized): Secondary | ICD-10-CM | POA: Diagnosis not present

## 2023-04-25 DIAGNOSIS — M545 Low back pain, unspecified: Secondary | ICD-10-CM | POA: Diagnosis not present

## 2023-04-28 DIAGNOSIS — S3210XD Unspecified fracture of sacrum, subsequent encounter for fracture with routine healing: Secondary | ICD-10-CM | POA: Diagnosis not present

## 2023-04-28 DIAGNOSIS — F1120 Opioid dependence, uncomplicated: Secondary | ICD-10-CM | POA: Diagnosis not present

## 2023-04-28 DIAGNOSIS — M6281 Muscle weakness (generalized): Secondary | ICD-10-CM | POA: Diagnosis not present

## 2023-04-28 DIAGNOSIS — M545 Low back pain, unspecified: Secondary | ICD-10-CM | POA: Diagnosis not present

## 2023-04-28 DIAGNOSIS — R2689 Other abnormalities of gait and mobility: Secondary | ICD-10-CM | POA: Diagnosis not present

## 2023-05-06 DIAGNOSIS — M545 Low back pain, unspecified: Secondary | ICD-10-CM | POA: Diagnosis not present

## 2023-05-13 DIAGNOSIS — D649 Anemia, unspecified: Secondary | ICD-10-CM | POA: Diagnosis not present

## 2023-05-13 DIAGNOSIS — R5383 Other fatigue: Secondary | ICD-10-CM | POA: Diagnosis not present

## 2023-05-25 ENCOUNTER — Encounter: Payer: Self-pay | Admitting: Internal Medicine

## 2023-05-30 DIAGNOSIS — F1120 Opioid dependence, uncomplicated: Secondary | ICD-10-CM | POA: Diagnosis not present

## 2023-05-30 DIAGNOSIS — M6281 Muscle weakness (generalized): Secondary | ICD-10-CM | POA: Diagnosis not present

## 2023-05-30 DIAGNOSIS — M545 Low back pain, unspecified: Secondary | ICD-10-CM | POA: Diagnosis not present

## 2023-05-30 DIAGNOSIS — R2689 Other abnormalities of gait and mobility: Secondary | ICD-10-CM | POA: Diagnosis not present

## 2023-05-30 DIAGNOSIS — S3210XD Unspecified fracture of sacrum, subsequent encounter for fracture with routine healing: Secondary | ICD-10-CM | POA: Diagnosis not present

## 2023-06-04 DIAGNOSIS — R2689 Other abnormalities of gait and mobility: Secondary | ICD-10-CM | POA: Diagnosis not present

## 2023-06-04 DIAGNOSIS — M545 Low back pain, unspecified: Secondary | ICD-10-CM | POA: Diagnosis not present

## 2023-06-04 DIAGNOSIS — M6281 Muscle weakness (generalized): Secondary | ICD-10-CM | POA: Diagnosis not present

## 2023-06-04 DIAGNOSIS — S3210XD Unspecified fracture of sacrum, subsequent encounter for fracture with routine healing: Secondary | ICD-10-CM | POA: Diagnosis not present

## 2023-06-04 DIAGNOSIS — F1120 Opioid dependence, uncomplicated: Secondary | ICD-10-CM | POA: Diagnosis not present

## 2023-06-05 DIAGNOSIS — M533 Sacrococcygeal disorders, not elsewhere classified: Secondary | ICD-10-CM | POA: Diagnosis not present

## 2023-06-06 DIAGNOSIS — M545 Low back pain, unspecified: Secondary | ICD-10-CM | POA: Diagnosis not present

## 2023-06-06 DIAGNOSIS — M6281 Muscle weakness (generalized): Secondary | ICD-10-CM | POA: Diagnosis not present

## 2023-06-06 DIAGNOSIS — R2689 Other abnormalities of gait and mobility: Secondary | ICD-10-CM | POA: Diagnosis not present

## 2023-06-06 DIAGNOSIS — S3210XD Unspecified fracture of sacrum, subsequent encounter for fracture with routine healing: Secondary | ICD-10-CM | POA: Diagnosis not present

## 2023-06-06 DIAGNOSIS — F1120 Opioid dependence, uncomplicated: Secondary | ICD-10-CM | POA: Diagnosis not present

## 2023-06-09 DIAGNOSIS — R2689 Other abnormalities of gait and mobility: Secondary | ICD-10-CM | POA: Diagnosis not present

## 2023-06-09 DIAGNOSIS — F1120 Opioid dependence, uncomplicated: Secondary | ICD-10-CM | POA: Diagnosis not present

## 2023-06-09 DIAGNOSIS — M545 Low back pain, unspecified: Secondary | ICD-10-CM | POA: Diagnosis not present

## 2023-06-09 DIAGNOSIS — M6281 Muscle weakness (generalized): Secondary | ICD-10-CM | POA: Diagnosis not present

## 2023-06-09 DIAGNOSIS — S3210XD Unspecified fracture of sacrum, subsequent encounter for fracture with routine healing: Secondary | ICD-10-CM | POA: Diagnosis not present

## 2023-06-11 DIAGNOSIS — M6281 Muscle weakness (generalized): Secondary | ICD-10-CM | POA: Diagnosis not present

## 2023-06-11 DIAGNOSIS — F1120 Opioid dependence, uncomplicated: Secondary | ICD-10-CM | POA: Diagnosis not present

## 2023-06-11 DIAGNOSIS — R2689 Other abnormalities of gait and mobility: Secondary | ICD-10-CM | POA: Diagnosis not present

## 2023-06-11 DIAGNOSIS — M545 Low back pain, unspecified: Secondary | ICD-10-CM | POA: Diagnosis not present

## 2023-06-11 DIAGNOSIS — M4800 Spinal stenosis, site unspecified: Secondary | ICD-10-CM | POA: Diagnosis not present

## 2023-06-11 DIAGNOSIS — Z6835 Body mass index (BMI) 35.0-35.9, adult: Secondary | ICD-10-CM | POA: Diagnosis not present

## 2023-06-11 DIAGNOSIS — S3210XD Unspecified fracture of sacrum, subsequent encounter for fracture with routine healing: Secondary | ICD-10-CM | POA: Diagnosis not present

## 2023-06-11 DIAGNOSIS — M4310 Spondylolisthesis, site unspecified: Secondary | ICD-10-CM | POA: Diagnosis not present

## 2023-06-12 ENCOUNTER — Other Ambulatory Visit: Payer: Self-pay | Admitting: Family Medicine

## 2023-06-12 DIAGNOSIS — M5416 Radiculopathy, lumbar region: Secondary | ICD-10-CM

## 2023-06-13 DIAGNOSIS — S3210XD Unspecified fracture of sacrum, subsequent encounter for fracture with routine healing: Secondary | ICD-10-CM | POA: Diagnosis not present

## 2023-06-13 DIAGNOSIS — F1120 Opioid dependence, uncomplicated: Secondary | ICD-10-CM | POA: Diagnosis not present

## 2023-06-13 DIAGNOSIS — M545 Low back pain, unspecified: Secondary | ICD-10-CM | POA: Diagnosis not present

## 2023-06-13 DIAGNOSIS — M6281 Muscle weakness (generalized): Secondary | ICD-10-CM | POA: Diagnosis not present

## 2023-06-13 DIAGNOSIS — R2689 Other abnormalities of gait and mobility: Secondary | ICD-10-CM | POA: Diagnosis not present

## 2023-06-16 DIAGNOSIS — M6281 Muscle weakness (generalized): Secondary | ICD-10-CM | POA: Diagnosis not present

## 2023-06-16 DIAGNOSIS — F1120 Opioid dependence, uncomplicated: Secondary | ICD-10-CM | POA: Diagnosis not present

## 2023-06-16 DIAGNOSIS — M545 Low back pain, unspecified: Secondary | ICD-10-CM | POA: Diagnosis not present

## 2023-06-16 DIAGNOSIS — S3210XD Unspecified fracture of sacrum, subsequent encounter for fracture with routine healing: Secondary | ICD-10-CM | POA: Diagnosis not present

## 2023-06-16 DIAGNOSIS — R2689 Other abnormalities of gait and mobility: Secondary | ICD-10-CM | POA: Diagnosis not present

## 2023-06-18 DIAGNOSIS — F1120 Opioid dependence, uncomplicated: Secondary | ICD-10-CM | POA: Diagnosis not present

## 2023-06-18 DIAGNOSIS — M545 Low back pain, unspecified: Secondary | ICD-10-CM | POA: Diagnosis not present

## 2023-06-18 DIAGNOSIS — S3210XD Unspecified fracture of sacrum, subsequent encounter for fracture with routine healing: Secondary | ICD-10-CM | POA: Diagnosis not present

## 2023-06-18 DIAGNOSIS — M6281 Muscle weakness (generalized): Secondary | ICD-10-CM | POA: Diagnosis not present

## 2023-06-18 DIAGNOSIS — R2689 Other abnormalities of gait and mobility: Secondary | ICD-10-CM | POA: Diagnosis not present

## 2023-06-20 DIAGNOSIS — R2689 Other abnormalities of gait and mobility: Secondary | ICD-10-CM | POA: Diagnosis not present

## 2023-06-20 DIAGNOSIS — S3210XD Unspecified fracture of sacrum, subsequent encounter for fracture with routine healing: Secondary | ICD-10-CM | POA: Diagnosis not present

## 2023-06-20 DIAGNOSIS — F1120 Opioid dependence, uncomplicated: Secondary | ICD-10-CM | POA: Diagnosis not present

## 2023-06-20 DIAGNOSIS — M6281 Muscle weakness (generalized): Secondary | ICD-10-CM | POA: Diagnosis not present

## 2023-06-20 DIAGNOSIS — M545 Low back pain, unspecified: Secondary | ICD-10-CM | POA: Diagnosis not present

## 2023-06-25 DIAGNOSIS — S3210XD Unspecified fracture of sacrum, subsequent encounter for fracture with routine healing: Secondary | ICD-10-CM | POA: Diagnosis not present

## 2023-06-25 DIAGNOSIS — M6281 Muscle weakness (generalized): Secondary | ICD-10-CM | POA: Diagnosis not present

## 2023-06-25 DIAGNOSIS — F1120 Opioid dependence, uncomplicated: Secondary | ICD-10-CM | POA: Diagnosis not present

## 2023-06-25 DIAGNOSIS — R2689 Other abnormalities of gait and mobility: Secondary | ICD-10-CM | POA: Diagnosis not present

## 2023-06-25 DIAGNOSIS — M545 Low back pain, unspecified: Secondary | ICD-10-CM | POA: Diagnosis not present

## 2023-06-26 DIAGNOSIS — M48062 Spinal stenosis, lumbar region with neurogenic claudication: Secondary | ICD-10-CM | POA: Diagnosis not present

## 2023-06-30 DIAGNOSIS — M6281 Muscle weakness (generalized): Secondary | ICD-10-CM | POA: Diagnosis not present

## 2023-06-30 DIAGNOSIS — R2689 Other abnormalities of gait and mobility: Secondary | ICD-10-CM | POA: Diagnosis not present

## 2023-06-30 DIAGNOSIS — M545 Low back pain, unspecified: Secondary | ICD-10-CM | POA: Diagnosis not present

## 2023-06-30 DIAGNOSIS — F1120 Opioid dependence, uncomplicated: Secondary | ICD-10-CM | POA: Diagnosis not present

## 2023-07-02 DIAGNOSIS — M6281 Muscle weakness (generalized): Secondary | ICD-10-CM | POA: Diagnosis not present

## 2023-07-02 DIAGNOSIS — M545 Low back pain, unspecified: Secondary | ICD-10-CM | POA: Diagnosis not present

## 2023-07-02 DIAGNOSIS — F1120 Opioid dependence, uncomplicated: Secondary | ICD-10-CM | POA: Diagnosis not present

## 2023-07-02 DIAGNOSIS — R2689 Other abnormalities of gait and mobility: Secondary | ICD-10-CM | POA: Diagnosis not present

## 2023-07-09 ENCOUNTER — Other Ambulatory Visit: Payer: Self-pay | Admitting: Pain Medicine

## 2023-07-09 DIAGNOSIS — S3210XA Unspecified fracture of sacrum, initial encounter for closed fracture: Secondary | ICD-10-CM

## 2023-07-11 ENCOUNTER — Ambulatory Visit
Admission: RE | Admit: 2023-07-11 | Discharge: 2023-07-11 | Disposition: A | Discharge status: Home / Self Care | Source: Ambulatory Visit | Attending: Pain Medicine | Admitting: Pain Medicine

## 2023-07-11 DIAGNOSIS — S3210XA Unspecified fracture of sacrum, initial encounter for closed fracture: Secondary | ICD-10-CM

## 2023-07-11 DIAGNOSIS — S3219XA Other fracture of sacrum, initial encounter for closed fracture: Secondary | ICD-10-CM | POA: Diagnosis not present

## 2023-07-11 HISTORY — PX: IR RADIOLOGIST EVAL & MGMT: IMG5224
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
## 2023-07-15 ENCOUNTER — Other Ambulatory Visit: Payer: Self-pay | Admitting: Interventional Radiology

## 2023-07-15 DIAGNOSIS — M8448XA Pathological fracture, other site, initial encounter for fracture: Secondary | ICD-10-CM

## 2023-07-17 NOTE — Discharge Instructions (Addendum)
 Vertebroplasty Post Procedure Discharge Instructions  May resume a regular diet and any medications that you routinely take (including pain medications). No driving day of procedure. Upon discharge go home and rest for at least 4 hours.  May use an ice pack as needed to injection sites on back.  Ice to back 30 minutes on and 30 minutes off, all day. May remove bandaids tomorrow after taking a shower. Replace daily with clean bandaid until healed. Do not lift anything heavier than a milk jug. Follow up with your attending physician in 2 weeks.  You may resume your Aspirin today.   Please contact our office at [phone] for the following symptoms:  Fever greater than 100 degrees Increased swelling, pain, or redness at injection site.   Thank you for visiting [facility].

## 2023-07-18 ENCOUNTER — Ambulatory Visit
Admission: RE | Admit: 2023-07-18 | Discharge: 2023-07-18 | Disposition: A | Discharge status: Home / Self Care | Payer: Self-pay | Source: Ambulatory Visit | Attending: Interventional Radiology | Admitting: Interventional Radiology

## 2023-07-18 DIAGNOSIS — M8448XA Pathological fracture, other site, initial encounter for fracture: Secondary | ICD-10-CM

## 2023-07-18 HISTORY — PX: IR VERTEBROPLASTY LUMBAR BX INC UNI/BIL INC/INJECT/IMAGING: IMG5516

## 2023-07-18 MED ORDER — KETOROLAC TROMETHAMINE 30 MG/ML IJ SOLN: 30.0000 mg | Freq: Once | INTRAMUSCULAR | Status: DC

## 2023-07-18 MED ORDER — MIDAZOLAM HCL 2 MG/2ML IJ SOLN
1.0000 mg | INTRAMUSCULAR | Status: DC | PRN
  Administered 2023-07-18 (×2): 1 mg via INTRAVENOUS

## 2023-07-18 MED ORDER — SODIUM CHLORIDE 0.9 % IV SOLN: INTRAVENOUS | Status: DC

## 2023-07-18 MED ORDER — VANCOMYCIN HCL IN DEXTROSE 1-5 GM/200ML-% IV SOLN
1000.0000 mg | INTRAVENOUS | Status: AC
  Administered 2023-07-18: 1000 mg via INTRAVENOUS

## 2023-07-18 MED ORDER — FENTANYL CITRATE PF 50 MCG/ML IJ SOSY
25.0000 ug | PREFILLED_SYRINGE | INTRAMUSCULAR | Status: DC | PRN
  Administered 2023-07-18 (×2): 25 ug via INTRAVENOUS
  Administered 2023-07-18: 50 ug via INTRAVENOUS

## 2023-07-18 MED ORDER — ACETAMINOPHEN 10 MG/ML IV SOLN
1000.0000 mg | Freq: Once | INTRAVENOUS | Status: AC
  Administered 2023-07-18: 1000 mg via INTRAVENOUS

## 2023-07-21 ENCOUNTER — Other Ambulatory Visit: Payer: Self-pay | Admitting: Cardiology

## 2023-07-29 ENCOUNTER — Other Ambulatory Visit: Payer: Self-pay | Admitting: Interventional Radiology

## 2023-07-29 DIAGNOSIS — M8448XA Pathological fracture, other site, initial encounter for fracture: Secondary | ICD-10-CM
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
## 2023-08-04 ENCOUNTER — Ambulatory Visit
Admission: RE | Admit: 2023-08-04 | Discharge: 2023-08-04 | Disposition: A | Discharge status: Home / Self Care | Source: Ambulatory Visit | Attending: Interventional Radiology | Admitting: Interventional Radiology

## 2023-08-04 DIAGNOSIS — M4317 Spondylolisthesis, lumbosacral region: Secondary | ICD-10-CM | POA: Diagnosis not present

## 2023-08-04 DIAGNOSIS — M8448XA Pathological fracture, other site, initial encounter for fracture: Secondary | ICD-10-CM

## 2023-08-04 DIAGNOSIS — M48061 Spinal stenosis, lumbar region without neurogenic claudication: Secondary | ICD-10-CM | POA: Diagnosis not present

## 2023-08-04 HISTORY — PX: IR RADIOLOGIST EVAL & MGMT: IMG5224
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
## 2023-08-05 ENCOUNTER — Other Ambulatory Visit: Payer: Self-pay | Admitting: Interventional Radiology

## 2023-08-05 ENCOUNTER — Ambulatory Visit: Attending: Cardiology | Admitting: Cardiology

## 2023-08-05 ENCOUNTER — Encounter: Payer: Self-pay | Admitting: Cardiology

## 2023-08-05 VITALS — BP 142/68 | HR 69 | Ht 63.0 in | Wt 195.0 lb

## 2023-08-05 DIAGNOSIS — M5416 Radiculopathy, lumbar region: Secondary | ICD-10-CM

## 2023-08-05 DIAGNOSIS — I2510 Atherosclerotic heart disease of native coronary artery without angina pectoris: Secondary | ICD-10-CM | POA: Diagnosis not present

## 2023-08-05 DIAGNOSIS — I10 Essential (primary) hypertension: Secondary | ICD-10-CM | POA: Diagnosis not present

## 2023-08-05 DIAGNOSIS — E7800 Pure hypercholesterolemia, unspecified: Secondary | ICD-10-CM | POA: Diagnosis not present

## 2023-08-05 MED ORDER — AMLODIPINE BESYLATE 5 MG PO TABS: 5.0000 mg | ORAL_TABLET | Freq: Every day | ORAL | 3 refills | Status: AC

## 2023-08-05 NOTE — Patient Instructions (Addendum)

## 2023-08-08 ENCOUNTER — Inpatient Hospital Stay: Admission: RE | Admit: 2023-08-08 | Source: Ambulatory Visit

## 2023-08-22 DIAGNOSIS — F1190 Opioid use, unspecified, uncomplicated: Secondary | ICD-10-CM | POA: Diagnosis not present

## 2023-08-22 DIAGNOSIS — M48062 Spinal stenosis, lumbar region with neurogenic claudication: Secondary | ICD-10-CM | POA: Diagnosis not present

## 2023-08-22 DIAGNOSIS — Z6835 Body mass index (BMI) 35.0-35.9, adult: Secondary | ICD-10-CM | POA: Diagnosis not present

## 2023-08-22 DIAGNOSIS — M533 Sacrococcygeal disorders, not elsewhere classified: Secondary | ICD-10-CM | POA: Diagnosis not present

## 2023-09-04 DIAGNOSIS — M4800 Spinal stenosis, site unspecified: Secondary | ICD-10-CM | POA: Diagnosis not present

## 2023-09-04 DIAGNOSIS — M549 Dorsalgia, unspecified: Secondary | ICD-10-CM | POA: Diagnosis not present

## 2023-09-04 DIAGNOSIS — M25569 Pain in unspecified knee: Secondary | ICD-10-CM | POA: Diagnosis not present

## 2023-09-04 DIAGNOSIS — Z6835 Body mass index (BMI) 35.0-35.9, adult: Secondary | ICD-10-CM | POA: Diagnosis not present

## 2023-09-04 DIAGNOSIS — M533 Sacrococcygeal disorders, not elsewhere classified: Secondary | ICD-10-CM | POA: Diagnosis not present

## 2023-09-23 DIAGNOSIS — M4800 Spinal stenosis, site unspecified: Secondary | ICD-10-CM | POA: Diagnosis not present

## 2023-09-23 DIAGNOSIS — M79606 Pain in leg, unspecified: Secondary | ICD-10-CM | POA: Diagnosis not present

## 2023-09-23 DIAGNOSIS — R2689 Other abnormalities of gait and mobility: Secondary | ICD-10-CM | POA: Diagnosis not present

## 2023-09-23 DIAGNOSIS — M6281 Muscle weakness (generalized): Secondary | ICD-10-CM | POA: Diagnosis not present

## 2023-09-23 DIAGNOSIS — M25569 Pain in unspecified knee: Secondary | ICD-10-CM | POA: Diagnosis not present

## 2023-09-23 DIAGNOSIS — M533 Sacrococcygeal disorders, not elsewhere classified: Secondary | ICD-10-CM | POA: Diagnosis not present

## 2023-10-01 DIAGNOSIS — M25569 Pain in unspecified knee: Secondary | ICD-10-CM | POA: Diagnosis not present

## 2023-10-01 DIAGNOSIS — M4800 Spinal stenosis, site unspecified: Secondary | ICD-10-CM | POA: Diagnosis not present

## 2023-10-01 DIAGNOSIS — M6281 Muscle weakness (generalized): Secondary | ICD-10-CM | POA: Diagnosis not present

## 2023-10-01 DIAGNOSIS — M549 Dorsalgia, unspecified: Secondary | ICD-10-CM | POA: Diagnosis not present

## 2023-10-01 DIAGNOSIS — R2689 Other abnormalities of gait and mobility: Secondary | ICD-10-CM | POA: Diagnosis not present

## 2023-10-01 DIAGNOSIS — M533 Sacrococcygeal disorders, not elsewhere classified: Secondary | ICD-10-CM | POA: Diagnosis not present

## 2023-10-02 ENCOUNTER — Other Ambulatory Visit: Payer: Self-pay | Admitting: Cardiology

## 2023-10-02 DIAGNOSIS — M25562 Pain in left knee: Secondary | ICD-10-CM | POA: Diagnosis not present

## 2023-10-02 DIAGNOSIS — Z23 Encounter for immunization: Secondary | ICD-10-CM | POA: Diagnosis not present

## 2023-10-02 DIAGNOSIS — M4800 Spinal stenosis, site unspecified: Secondary | ICD-10-CM | POA: Diagnosis not present

## 2023-10-02 DIAGNOSIS — R32 Unspecified urinary incontinence: Secondary | ICD-10-CM | POA: Diagnosis not present

## 2023-10-02 DIAGNOSIS — Z6835 Body mass index (BMI) 35.0-35.9, adult: Secondary | ICD-10-CM | POA: Diagnosis not present

## 2023-10-08 DIAGNOSIS — M1712 Unilateral primary osteoarthritis, left knee: Secondary | ICD-10-CM | POA: Diagnosis not present

## 2023-10-09 ENCOUNTER — Ambulatory Visit: Admitting: Nurse Practitioner

## 2023-10-09 ENCOUNTER — Encounter: Payer: Self-pay | Admitting: Nurse Practitioner

## 2023-10-09 VITALS — BP 126/56 | HR 78 | Ht 63.0 in | Wt 195.0 lb

## 2023-10-09 DIAGNOSIS — K5909 Other constipation: Secondary | ICD-10-CM

## 2023-10-09 DIAGNOSIS — K219 Gastro-esophageal reflux disease without esophagitis: Secondary | ICD-10-CM

## 2023-10-09 DIAGNOSIS — D509 Iron deficiency anemia, unspecified: Secondary | ICD-10-CM | POA: Diagnosis not present

## 2023-10-09 DIAGNOSIS — K6289 Other specified diseases of anus and rectum: Secondary | ICD-10-CM | POA: Diagnosis not present

## 2023-10-09 DIAGNOSIS — E0849 Diabetes mellitus due to underlying condition with other diabetic neurological complication: Secondary | ICD-10-CM | POA: Insufficient documentation

## 2023-10-09 DIAGNOSIS — K5904 Chronic idiopathic constipation: Secondary | ICD-10-CM

## 2023-10-09 NOTE — Patient Instructions (Addendum)
 Apply a small amount of Desitin inside the anal opening and to the external anal area three times daily as needed for anal or hemorrhoidal irritation/bleeding.   Continue Miralax  one capful mixed in 8 ounces of water daily at 5 pm  Two days weekly, take additional Miralax  1/2 capful mixed in 8 ounce of water in the morning.   Follow up as needed   _______________________________________________________  If your blood pressure at your visit was 140/90 or greater, please contact your primary care physician to follow up on this.  _______________________________________________________  If you are age 21 or older, your body mass index should be between 23-30. Your Body mass index is 34.54 kg/m. If this is out of the aforementioned range listed, please consider follow up with your Primary Care Provider.  If you are age 39 or younger, your body mass index should be between 19-25. Your Body mass index is 34.54 kg/m. If this is out of the aformentioned range listed, please consider follow up with your Primary Care Provider.   ________________________________________________________  The [facility] providers would like to encourage you to use MYCHART to communicate with providers for non-urgent requests or questions.  Due to long hold times on the telephone, sending your provider a message by [facility] may be a faster and more efficient way to get a response.  Please allow 48 business hours for a response.  Please remember that this is for non-urgent requests.  _______________________________________________________  [facility] is using a team-based approach to care.  Your team is made up of your doctor and two to three APPS. Our APPS (Nurse Practitioners and Physician Assistants) work with your physician to ensure care continuity for you. They are fully qualified to address your health concerns and develop a treatment plan. They communicate directly with your gastroenterologist to care  for you. Seeing the Advanced Practice Practitioners on your physician's team can help you by facilitating care more promptly, often allowing for earlier appointments, access to diagnostic testing, procedures, and other specialty referrals.   Thank you for trusting me with your gastrointestinal care!   Elida Shawl, CRNP

## 2023-10-09 NOTE — Progress Notes (Signed)
 10/09/2023 Pamela Gonzales [id number] [birth year]   Chief Complaint:  Constipation follow up  History of Present Illness: Pamela Gonzales is a 89 year old female with a past medical history of GERD with complex hiatal hernia and Cameron's erosions, IDA felt secondary to hiatal hernia, prior gastritis, chronic constipation, colonic diverticulosis and remote colonic polyps. She is known by Dr. Albertus. She was last seen in office by Dr. Albertus 01/07/2023 due to having incomplete bowel movements despite taking MiraLAX  and increased reflux symptoms triggered by caffeine.  She was instructed to increase MiraLAX  to one half capfuls daily and to maintain a fiber diet as tolerated.  Regarding her reflux, she was instructed to continue Pantoprazole  40 mg daily and to use Tums for breakthrough symptoms. She presents today with one concern. She stated she has been on MiraLAX  once daily for years and sometimes feels it is less effective and she is requesting instruction on how to increase the MiraLAX  without causing frequent loose stools. She describes passing a small formed [place] bowel movement every other day.  No bloody or black stools.  She stated she does not want to depend on a laxative to pass a bowel movement. She is aware to avoid straining. She fell and fractured her sacrum this past winter. She sometimes has minor anal irritation for which she uses [facility] as needed. Her most recent flexible sigmoidoscopy was 07/09/2016 which identified one 3 mm tubular adenomatous polyp removed from the rectum otherwise was normal.  Labs/17/2025 per [facility] PCP: WBC 7.2.  Hemoglobin 13.7.  Hematocrit 40.3.  Platelets 216.  Iron  83.  Iron  saturation 23%.  TIBC 367.  B12 877.  TSH 1.08.      Latest Ref Rng & Units 02/08/2022    3:45 PM 04/02/2021    1:17 PM 12/28/2020   10:53 AM  CBC  WBC 4.0 - 10.5 K/uL 7.9  10.5  7.7   Hemoglobin 12.0 - 15.0 g/dL 84.9  86.2  87.8   Hematocrit 36.0 - 46.0 % 44.2  41.3  37.2    Platelets 150 - 400 K/uL 204  198.0  255.0        Latest Ref Rng & Units 02/08/2022    3:45 PM 06/05/2020    2:01 PM 07/20/2019    8:11 AM  CMP  Glucose 70 - 99 mg/dL 899  837  95   BUN 8 - 23 mg/dL 16  18  12    Creatinine 0.44 - 1.00 mg/dL 9.31  9.21  9.27   Sodium 135 - 145 mmol/L 136  140  141   Potassium 3.5 - 5.1 mmol/L 3.7  3.8  4.7   Chloride 98 - 111 mmol/L 101  106  105   CO2 22 - 32 mmol/L 25  27  24    Calcium  8.9 - 10.3 mg/dL 9.7  9.3  9.5   Total Protein 6.0 - 8.5 g/dL   6.5   Total Bilirubin 0.0 - 1.2 mg/dL   0.7   Alkaline Phos 48 - 121 IU/L   62   AST 0 - 40 IU/L   24   ALT 0 - 32 IU/L   15      <MPH could not be calculated without both parents' heights> Past Surgical History:  Procedure Laterality Date   ABDOMINAL HYSTERECTOMY  1975   BREAST EXCISIONAL BIOPSY Right    1972 benign   CATARACT EXTRACTION, BILATERAL     CHOLECYSTECTOMY  07/04/2011   Procedure:  LAPAROSCOPIC CHOLECYSTECTOMY WITH INTRAOPERATIVE CHOLANGIOGRAM;  Surgeon: Deward GORMAN Curvin DOUGLAS, MD;  Location: [facility] OR;  Service: General;  Laterality: N/A;   COLONOSCOPY W/ BIOPSIES     CORONARY ANGIOPLASTY  2005   ESOPHAGOGASTRODUODENOSCOPY (EGD) WITH PROPOFOL  N/A 06/11/2016   Procedure: ESOPHAGOGASTRODUODENOSCOPY (EGD) WITH PROPOFOL ;  Surgeon: Vicci Gladis POUR, MD;  [facility];  Service: Endoscopy;  Laterality: N/A;   FIBULA FRACTURE SURGERY  2011   Right Ankle   FLEXIBLE SIGMOIDOSCOPY N/A 07/09/2016   Procedure: FLEXIBLE SIGMOIDOSCOPY;  Surgeon: Vicci Gladis POUR, MD;  [facility]: [facility];  Service: Endoscopy;  Laterality: N/A;   IR RADIOLOGIST EVAL & MGMT  07/11/2023   IR RADIOLOGIST EVAL & MGMT  08/04/2023   IR VERTEBROPLASTY LUMBAR BX INC UNI/BIL INC/INJECT/IMAGING  07/18/2023   KNEE ARTHROSCOPY Right 03/15/2019   Procedure: Right knee arthroscopy; meniscal debridement chondroplasty;  Surgeon: Melodi Lerner, MD;  Location: [facility];  Service: Orthopedics;  Laterality: Right;     Current Outpatient Medications on File Prior to Visit  Medication Sig Dispense Refill   acetaminophen  (TYLENOL ) 500 MG tablet Take 500 mg by mouth 3 (three) times daily. Patient takes 4 times daily.     amLODipine  (NORVASC ) 5 MG tablet Take 1 tablet (5 mg total) by mouth daily. 90 tablet 3   aspirin 81 MG tablet Take 81 mg by mouth daily.     Calcium  Carb-Cholecalciferol (CALCIUM  PLUS VITAMIN D3) 600-20 MG-MCG TABS Take 1 tablet by mouth daily at 6 (six) AM.     CELEBREX 200 MG capsule Take 200 mg by mouth 2 (two) times daily.     Coenzyme Q10 (CO Q 10 PO) Take 200 mg by mouth daily.     guaiFENesin (MUCINEX) 600 MG 12 hr tablet Take 600 mg by mouth 2 (two) times daily.     loratadine (CLARITIN) 10 MG tablet Take 10 mg by mouth daily.     losartan  (COZAAR ) 100 MG tablet TAKE 1 TABLET BY MOUTH EVERY DAY 90 tablet 3   Multiple Vitamins-Minerals (MULTIVITAMIN WITH MINERALS) tablet Take 1 tablet by mouth daily.     MYRBETRIQ 50 MG TB24 tablet Take 50 mg by mouth daily.   2   NASONEX 50 MCG/ACT nasal spray 2 sprays by Nasal route Daily.     pantoprazole  (PROTONIX ) 40 MG tablet TAKE 1 TABLET BY MOUTH EVERY DAY 90 tablet 1   polyethylene glycol powder (MIRALAX ) 17 GM/SCOOP powder Take by mouth daily.     rosuvastatin  (CRESTOR ) 40 MG tablet TAKE 1 TABLET BY MOUTH EVERY DAY 90 tablet 3   No current facility-administered medications on file prior to visit.   Allergies  Allergen Reactions   Cephalexin Other (See Comments)    terrible abdominal pain   Tramadol Other (See Comments)    Per pt hallucinations    Acetaminophen  Other (See Comments)   Cefdinir     Other Reaction(s): GI upset   Cephalexin Other (See Comments)    Other Reaction(s): GI upset  cephalexin   Oxycodone-Acetaminophen  Itching    Patient states she is not allergic to tylenol .  Takes several times a day.  Other Reaction(s): rash/itching   Penicillamine     Other Reaction(s): Not available, Not available   Penicillin  G     Other Reaction(s): knots on her body   Tramadol Hcl     Other Reaction(s): hallucinations   Lisinopril  Cough   Oxycodone Itching   Penicillins Other (See Comments)    Pt reports knots on  my arms   Sulfa Antibiotics Other (See Comments) and Rash    Skin irritation.  Other Reaction(s): severe fatigue   Sulfa Drugs Cross Reactors Other (See Comments)    Skin irritation.   Current Medications, Allergies, Past Medical History, Past Surgical History, Family History and Social History were reviewed in [employer] record.  Review of Systems:   Constitutional: Negative for fever, sweats, chills or weight loss.  Respiratory: Negative for shortness of breath.   Cardiovascular: + Leg swelling.  Gastrointestinal: See HPI.  Musculoskeletal: + Back pain. Neurological: Negative for dizziness, headaches or paresthesias.   Physical Exam: BP (!) 126/56   Pulse 78   Ht 5' 3 (1.6 m)   Wt 195 lb (88.5 kg) Comment: verbal  BMI 34.54 kg/m   General: 89 year old female in no acute distress. Head: Normocephalic and atraumatic. Eyes: No scleral icterus. Conjunctiva pink . Ears: Normal auditory acuity. Mouth: Dentition intact. No ulcers or lesions.  Lungs: Clear throughout to auscultation. Heart: Regular rate and rhythm, no murmur. Abdomen: Soft, nontender and nondistended. No masses or hepatomegaly. Normal bowel sounds x 4 quadrants.  Rectal: Patient declined rectal exam. Musculoskeletal: Symmetrical with no gross deformities. Extremities: No edema. Neurological: Alert oriented x 4. No focal deficits.  Psychological: Alert and cooperative. Normal mood and affect  Assessment and Recommendations:  Chronic constipation/pelvic floor dysfunction - MiraLAX  1 capful mixed in 8 ounces of water daily, patient takes at 5 PM every day - Slowly add a second dose of MiraLAX  one half capful 2 days weekly and monitor results  Intermittent anal irritation without  bleeding - Apply a small amount of Desitin inside the anal opening and to the external anal area three times daily as needed for anal or hemorrhoidal irritation/bleeding.   GERD, hiatal hernia with history of Cameron erosions - Continue Pantoprazole  40 mg once daily - Continue Tums as needed for breakthrough reflux symptoms  IDA likely secondary to hiatal hernia - Continue routine CBC and iron  panel per PCP

## 2023-10-10 DIAGNOSIS — M533 Sacrococcygeal disorders, not elsewhere classified: Secondary | ICD-10-CM | POA: Diagnosis not present

## 2023-10-10 DIAGNOSIS — R2689 Other abnormalities of gait and mobility: Secondary | ICD-10-CM | POA: Diagnosis not present

## 2023-10-10 DIAGNOSIS — M549 Dorsalgia, unspecified: Secondary | ICD-10-CM | POA: Diagnosis not present

## 2023-10-10 DIAGNOSIS — M6281 Muscle weakness (generalized): Secondary | ICD-10-CM | POA: Diagnosis not present

## 2023-10-10 DIAGNOSIS — M25569 Pain in unspecified knee: Secondary | ICD-10-CM | POA: Diagnosis not present

## 2023-10-10 DIAGNOSIS — M4800 Spinal stenosis, site unspecified: Secondary | ICD-10-CM | POA: Diagnosis not present

## 2023-10-15 DIAGNOSIS — N3946 Mixed incontinence: Secondary | ICD-10-CM | POA: Diagnosis not present

## 2023-10-15 DIAGNOSIS — R350 Frequency of micturition: Secondary | ICD-10-CM | POA: Diagnosis not present

## 2023-10-21 DIAGNOSIS — M6281 Muscle weakness (generalized): Secondary | ICD-10-CM | POA: Diagnosis not present

## 2023-10-21 DIAGNOSIS — M25569 Pain in unspecified knee: Secondary | ICD-10-CM | POA: Diagnosis not present

## 2023-10-21 DIAGNOSIS — M4800 Spinal stenosis, site unspecified: Secondary | ICD-10-CM | POA: Diagnosis not present

## 2023-10-21 DIAGNOSIS — M549 Dorsalgia, unspecified: Secondary | ICD-10-CM | POA: Diagnosis not present

## 2023-10-21 DIAGNOSIS — R2689 Other abnormalities of gait and mobility: Secondary | ICD-10-CM | POA: Diagnosis not present

## 2023-10-22 DIAGNOSIS — M48061 Spinal stenosis, lumbar region without neurogenic claudication: Secondary | ICD-10-CM | POA: Diagnosis not present

## 2023-10-24 DIAGNOSIS — M4800 Spinal stenosis, site unspecified: Secondary | ICD-10-CM | POA: Diagnosis not present

## 2023-10-24 DIAGNOSIS — M533 Sacrococcygeal disorders, not elsewhere classified: Secondary | ICD-10-CM | POA: Diagnosis not present

## 2023-10-24 DIAGNOSIS — M6281 Muscle weakness (generalized): Secondary | ICD-10-CM | POA: Diagnosis not present

## 2023-10-24 DIAGNOSIS — R2689 Other abnormalities of gait and mobility: Secondary | ICD-10-CM | POA: Diagnosis not present

## 2023-10-24 DIAGNOSIS — M549 Dorsalgia, unspecified: Secondary | ICD-10-CM | POA: Diagnosis not present

## 2023-10-28 DIAGNOSIS — M549 Dorsalgia, unspecified: Secondary | ICD-10-CM | POA: Diagnosis not present

## 2023-10-28 DIAGNOSIS — R2689 Other abnormalities of gait and mobility: Secondary | ICD-10-CM | POA: Diagnosis not present

## 2023-10-28 DIAGNOSIS — M533 Sacrococcygeal disorders, not elsewhere classified: Secondary | ICD-10-CM | POA: Diagnosis not present

## 2023-10-28 DIAGNOSIS — M4800 Spinal stenosis, site unspecified: Secondary | ICD-10-CM | POA: Diagnosis not present

## 2023-10-28 DIAGNOSIS — M25569 Pain in unspecified knee: Secondary | ICD-10-CM | POA: Diagnosis not present

## 2023-10-29 DIAGNOSIS — K219 Gastro-esophageal reflux disease without esophagitis: Secondary | ICD-10-CM | POA: Diagnosis not present

## 2023-10-29 DIAGNOSIS — R634 Abnormal weight loss: Secondary | ICD-10-CM | POA: Diagnosis not present

## 2023-10-29 DIAGNOSIS — Z0000 Encounter for general adult medical examination without abnormal findings: Secondary | ICD-10-CM | POA: Diagnosis not present

## 2023-10-29 DIAGNOSIS — I10 Essential (primary) hypertension: Secondary | ICD-10-CM | POA: Diagnosis not present

## 2023-10-29 DIAGNOSIS — N390 Urinary tract infection, site not specified: Secondary | ICD-10-CM | POA: Diagnosis not present

## 2023-10-29 DIAGNOSIS — M810 Age-related osteoporosis without current pathological fracture: Secondary | ICD-10-CM | POA: Diagnosis not present

## 2023-10-29 DIAGNOSIS — D649 Anemia, unspecified: Secondary | ICD-10-CM | POA: Diagnosis not present

## 2023-10-29 DIAGNOSIS — I779 Disorder of arteries and arterioles, unspecified: Secondary | ICD-10-CM | POA: Diagnosis not present

## 2023-10-29 DIAGNOSIS — Z6832 Body mass index (BMI) 32.0-32.9, adult: Secondary | ICD-10-CM | POA: Diagnosis not present

## 2023-10-29 LAB — LAB REPORT - SCANNED
Albumin, Urine POC: <0.7
Creatinine, POC: 36 mg/dL
EGFR: 82
Microalb Creat Ratio: <19.3

## 2023-10-31 DIAGNOSIS — M5430 Sciatica, unspecified side: Secondary | ICD-10-CM | POA: Diagnosis not present

## 2023-10-31 DIAGNOSIS — M79606 Pain in leg, unspecified: Secondary | ICD-10-CM | POA: Diagnosis not present

## 2023-10-31 DIAGNOSIS — R2689 Other abnormalities of gait and mobility: Secondary | ICD-10-CM | POA: Diagnosis not present

## 2023-10-31 DIAGNOSIS — M25552 Pain in left hip: Secondary | ICD-10-CM | POA: Diagnosis not present

## 2023-10-31 DIAGNOSIS — M7062 Trochanteric bursitis, left hip: Secondary | ICD-10-CM | POA: Diagnosis not present

## 2023-10-31 DIAGNOSIS — M6281 Muscle weakness (generalized): Secondary | ICD-10-CM | POA: Diagnosis not present

## 2023-11-10 DIAGNOSIS — M48061 Spinal stenosis, lumbar region without neurogenic claudication: Secondary | ICD-10-CM | POA: Diagnosis not present

## 2023-11-11 DIAGNOSIS — M79606 Pain in leg, unspecified: Secondary | ICD-10-CM | POA: Diagnosis not present

## 2023-11-11 DIAGNOSIS — M25552 Pain in left hip: Secondary | ICD-10-CM | POA: Diagnosis not present

## 2023-11-11 DIAGNOSIS — R2689 Other abnormalities of gait and mobility: Secondary | ICD-10-CM | POA: Diagnosis not present

## 2023-11-11 DIAGNOSIS — M7062 Trochanteric bursitis, left hip: Secondary | ICD-10-CM | POA: Diagnosis not present

## 2023-11-11 DIAGNOSIS — M5430 Sciatica, unspecified side: Secondary | ICD-10-CM | POA: Diagnosis not present

## 2023-11-11 DIAGNOSIS — M6281 Muscle weakness (generalized): Secondary | ICD-10-CM | POA: Diagnosis not present

## 2023-11-19 DIAGNOSIS — M6281 Muscle weakness (generalized): Secondary | ICD-10-CM | POA: Diagnosis not present

## 2023-11-19 DIAGNOSIS — M79606 Pain in leg, unspecified: Secondary | ICD-10-CM | POA: Diagnosis not present

## 2023-11-19 DIAGNOSIS — R2689 Other abnormalities of gait and mobility: Secondary | ICD-10-CM | POA: Diagnosis not present

## 2023-11-19 DIAGNOSIS — M7062 Trochanteric bursitis, left hip: Secondary | ICD-10-CM | POA: Diagnosis not present

## 2023-11-20 DIAGNOSIS — M25552 Pain in left hip: Secondary | ICD-10-CM | POA: Diagnosis not present

## 2023-11-20 DIAGNOSIS — M79606 Pain in leg, unspecified: Secondary | ICD-10-CM | POA: Diagnosis not present

## 2023-11-20 DIAGNOSIS — M7062 Trochanteric bursitis, left hip: Secondary | ICD-10-CM | POA: Diagnosis not present

## 2023-11-20 DIAGNOSIS — M5430 Sciatica, unspecified side: Secondary | ICD-10-CM | POA: Diagnosis not present

## 2023-11-20 DIAGNOSIS — R2689 Other abnormalities of gait and mobility: Secondary | ICD-10-CM | POA: Diagnosis not present

## 2023-11-20 DIAGNOSIS — M6281 Muscle weakness (generalized): Secondary | ICD-10-CM | POA: Diagnosis not present

## 2023-11-21 DIAGNOSIS — M7062 Trochanteric bursitis, left hip: Secondary | ICD-10-CM | POA: Diagnosis not present

## 2023-11-21 DIAGNOSIS — M5430 Sciatica, unspecified side: Secondary | ICD-10-CM | POA: Diagnosis not present

## 2023-11-21 DIAGNOSIS — M25552 Pain in left hip: Secondary | ICD-10-CM | POA: Diagnosis not present

## 2023-11-21 DIAGNOSIS — R2689 Other abnormalities of gait and mobility: Secondary | ICD-10-CM | POA: Diagnosis not present

## 2023-11-21 DIAGNOSIS — M6281 Muscle weakness (generalized): Secondary | ICD-10-CM | POA: Diagnosis not present

## 2023-11-25 DIAGNOSIS — M25552 Pain in left hip: Secondary | ICD-10-CM | POA: Diagnosis not present

## 2023-11-26 DIAGNOSIS — D729 Disorder of white blood cells, unspecified: Secondary | ICD-10-CM | POA: Diagnosis not present

## 2023-11-28 DIAGNOSIS — M6281 Muscle weakness (generalized): Secondary | ICD-10-CM | POA: Diagnosis not present

## 2023-11-28 DIAGNOSIS — M25552 Pain in left hip: Secondary | ICD-10-CM | POA: Diagnosis not present

## 2023-11-28 DIAGNOSIS — R2689 Other abnormalities of gait and mobility: Secondary | ICD-10-CM | POA: Diagnosis not present

## 2023-11-28 DIAGNOSIS — M5430 Sciatica, unspecified side: Secondary | ICD-10-CM | POA: Diagnosis not present

## 2023-11-28 DIAGNOSIS — M7062 Trochanteric bursitis, left hip: Secondary | ICD-10-CM | POA: Diagnosis not present

## 2023-12-29 DIAGNOSIS — M5416 Radiculopathy, lumbar region: Secondary | ICD-10-CM | POA: Diagnosis not present

## 2023-12-29 DIAGNOSIS — M48062 Spinal stenosis, lumbar region with neurogenic claudication: Secondary | ICD-10-CM | POA: Diagnosis not present

## 2023-12-29 DIAGNOSIS — G8929 Other chronic pain: Secondary | ICD-10-CM | POA: Diagnosis not present
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
## 2024-02-19 ENCOUNTER — Ambulatory Visit: Admitting: Cardiology

## 2024-02-19 ENCOUNTER — Encounter: Payer: Self-pay | Admitting: Cardiology

## 2024-02-19 VITALS — BP 122/68 | HR 78 | Ht 63.0 in | Wt 181.4 lb

## 2024-02-19 DIAGNOSIS — I2510 Atherosclerotic heart disease of native coronary artery without angina pectoris: Secondary | ICD-10-CM

## 2024-02-19 DIAGNOSIS — E7800 Pure hypercholesterolemia, unspecified: Secondary | ICD-10-CM

## 2024-02-19 DIAGNOSIS — I10 Essential (primary) hypertension: Secondary | ICD-10-CM | POA: Diagnosis not present

## 2024-02-19 NOTE — Patient Instructions (Addendum)
